# Patient Record
Sex: Female | Born: 1950
Health system: Southern US, Community
[De-identification: ages and names within clinical notes are randomized; demographics above are authoritative.]

## PROBLEM LIST (undated history)

## (undated) DIAGNOSIS — M069 Rheumatoid arthritis, unspecified: Secondary | ICD-10-CM

## (undated) DIAGNOSIS — D649 Anemia, unspecified: Secondary | ICD-10-CM

## (undated) DIAGNOSIS — M1990 Unspecified osteoarthritis, unspecified site: Secondary | ICD-10-CM

## (undated) DIAGNOSIS — G4730 Sleep apnea, unspecified: Secondary | ICD-10-CM

## (undated) DIAGNOSIS — I10 Essential (primary) hypertension: Secondary | ICD-10-CM

## (undated) DIAGNOSIS — M8580 Other specified disorders of bone density and structure, unspecified site: Secondary | ICD-10-CM

## (undated) DIAGNOSIS — R112 Nausea with vomiting, unspecified: Secondary | ICD-10-CM

## (undated) DIAGNOSIS — R42 Dizziness and giddiness: Secondary | ICD-10-CM

## (undated) DIAGNOSIS — I720 Aneurysm of carotid artery: Secondary | ICD-10-CM

## (undated) DIAGNOSIS — Z9289 Personal history of other medical treatment: Secondary | ICD-10-CM

## (undated) DIAGNOSIS — G43909 Migraine, unspecified, not intractable, without status migrainosus: Secondary | ICD-10-CM

## (undated) DIAGNOSIS — A599 Trichomoniasis, unspecified: Secondary | ICD-10-CM

## (undated) DIAGNOSIS — Z9889 Other specified postprocedural states: Secondary | ICD-10-CM

## (undated) DIAGNOSIS — R7303 Prediabetes: Secondary | ICD-10-CM

## (undated) HISTORY — DX: Rheumatoid arthritis, unspecified: M06.9

## (undated) HISTORY — DX: Dizziness and giddiness: R42

## (undated) HISTORY — DX: Anemia, unspecified: D64.9

## (undated) HISTORY — DX: Other specified disorders of bone density and structure, unspecified site: M85.80

## (undated) HISTORY — DX: Essential (primary) hypertension: I10

## (undated) HISTORY — DX: Trichomoniasis, unspecified: A59.9

## (undated) HISTORY — DX: Migraine, unspecified, not intractable, without status migrainosus: G43.909

## (undated) HISTORY — PX: KNEE SURGERY: SHX244

## (undated) HISTORY — DX: Aneurysm of carotid artery: I72.0

## (undated) HISTORY — PX: ABDOMINAL HYSTERECTOMY: SHX81

---

## 1994-11-19 HISTORY — PX: SHOULDER SURGERY: SHX246

## 1998-12-07 ENCOUNTER — Ambulatory Visit (HOSPITAL_COMMUNITY): Admission: RE | Admit: 1998-12-07 | Discharge: 1998-12-07 | Payer: Self-pay | Admitting: Internal Medicine

## 1998-12-07 ENCOUNTER — Encounter: Payer: Self-pay | Admitting: Internal Medicine

## 1998-12-09 ENCOUNTER — Ambulatory Visit (HOSPITAL_COMMUNITY): Admission: RE | Admit: 1998-12-09 | Discharge: 1998-12-09 | Payer: Self-pay | Admitting: Internal Medicine

## 1999-05-26 ENCOUNTER — Encounter: Admission: RE | Admit: 1999-05-26 | Discharge: 1999-06-15 | Payer: Self-pay | Admitting: Orthopedic Surgery

## 2000-11-06 ENCOUNTER — Encounter: Payer: Self-pay | Admitting: Obstetrics and Gynecology

## 2000-11-06 ENCOUNTER — Encounter: Admission: RE | Admit: 2000-11-06 | Discharge: 2000-11-06 | Payer: Self-pay | Admitting: Obstetrics and Gynecology

## 2001-05-01 ENCOUNTER — Encounter: Payer: Self-pay | Admitting: Cardiology

## 2001-05-01 ENCOUNTER — Ambulatory Visit (HOSPITAL_COMMUNITY): Admission: RE | Admit: 2001-05-01 | Discharge: 2001-05-01 | Payer: Self-pay | Admitting: Cardiology

## 2001-11-07 ENCOUNTER — Emergency Department (HOSPITAL_COMMUNITY): Admission: EM | Admit: 2001-11-07 | Discharge: 2001-11-07 | Payer: Self-pay | Admitting: Emergency Medicine

## 2001-11-07 ENCOUNTER — Encounter: Payer: Self-pay | Admitting: Emergency Medicine

## 2001-12-01 ENCOUNTER — Ambulatory Visit (HOSPITAL_COMMUNITY): Admission: RE | Admit: 2001-12-01 | Discharge: 2001-12-01 | Payer: Self-pay | Admitting: Cardiology

## 2001-12-01 ENCOUNTER — Encounter: Payer: Self-pay | Admitting: Cardiology

## 2002-07-28 ENCOUNTER — Ambulatory Visit (HOSPITAL_COMMUNITY): Admission: RE | Admit: 2002-07-28 | Discharge: 2002-07-28 | Payer: Self-pay

## 2003-05-01 ENCOUNTER — Emergency Department (HOSPITAL_COMMUNITY): Admission: EM | Admit: 2003-05-01 | Discharge: 2003-05-01 | Payer: Self-pay | Admitting: Emergency Medicine

## 2003-09-22 ENCOUNTER — Ambulatory Visit (HOSPITAL_COMMUNITY): Admission: RE | Admit: 2003-09-22 | Discharge: 2003-09-22 | Payer: Self-pay | Admitting: Interventional Radiology

## 2003-10-06 ENCOUNTER — Emergency Department (HOSPITAL_COMMUNITY): Admission: EM | Admit: 2003-10-06 | Discharge: 2003-10-07 | Payer: Self-pay | Admitting: Emergency Medicine

## 2003-10-26 ENCOUNTER — Ambulatory Visit (HOSPITAL_COMMUNITY): Admission: RE | Admit: 2003-10-26 | Discharge: 2003-10-26 | Payer: Self-pay | Admitting: Interventional Radiology

## 2003-11-09 ENCOUNTER — Ambulatory Visit (HOSPITAL_COMMUNITY): Admission: RE | Admit: 2003-11-09 | Discharge: 2003-11-09 | Payer: Self-pay | Admitting: Interventional Radiology

## 2003-11-20 DIAGNOSIS — I720 Aneurysm of carotid artery: Secondary | ICD-10-CM

## 2003-11-20 HISTORY — DX: Aneurysm of carotid artery: I72.0

## 2003-11-20 HISTORY — PX: OTHER SURGICAL HISTORY: SHX169

## 2003-12-21 ENCOUNTER — Inpatient Hospital Stay (HOSPITAL_COMMUNITY): Admission: RE | Admit: 2003-12-21 | Discharge: 2003-12-30 | Payer: Self-pay | Admitting: Critical Care Medicine

## 2003-12-30 ENCOUNTER — Inpatient Hospital Stay (HOSPITAL_COMMUNITY)
Admission: RE | Admit: 2003-12-30 | Discharge: 2004-01-05 | Payer: Self-pay | Admitting: Physical Medicine & Rehabilitation

## 2004-02-09 ENCOUNTER — Encounter
Admission: RE | Admit: 2004-02-09 | Discharge: 2004-05-09 | Payer: Self-pay | Admitting: Physical Medicine & Rehabilitation

## 2004-10-20 ENCOUNTER — Ambulatory Visit (HOSPITAL_COMMUNITY): Admission: RE | Admit: 2004-10-20 | Discharge: 2004-10-20 | Payer: Self-pay | Admitting: Interventional Radiology

## 2005-01-02 ENCOUNTER — Ambulatory Visit (HOSPITAL_COMMUNITY): Admission: RE | Admit: 2005-01-02 | Discharge: 2005-01-02 | Payer: Self-pay | Admitting: Cardiology

## 2005-01-05 ENCOUNTER — Ambulatory Visit (HOSPITAL_COMMUNITY): Admission: RE | Admit: 2005-01-05 | Discharge: 2005-01-05 | Payer: Self-pay | Admitting: Cardiology

## 2005-04-11 ENCOUNTER — Emergency Department (HOSPITAL_COMMUNITY): Admission: EM | Admit: 2005-04-11 | Discharge: 2005-04-11 | Payer: Self-pay | Admitting: Emergency Medicine

## 2005-10-12 ENCOUNTER — Ambulatory Visit (HOSPITAL_COMMUNITY): Admission: RE | Admit: 2005-10-12 | Discharge: 2005-10-12 | Payer: Self-pay | Admitting: Interventional Radiology

## 2006-07-30 ENCOUNTER — Encounter: Admission: RE | Admit: 2006-07-30 | Discharge: 2006-07-30 | Payer: Self-pay | Admitting: Orthopedic Surgery

## 2006-11-22 ENCOUNTER — Ambulatory Visit (HOSPITAL_COMMUNITY): Admission: RE | Admit: 2006-11-22 | Discharge: 2006-11-22 | Payer: Self-pay | Admitting: Interventional Radiology

## 2007-11-17 ENCOUNTER — Ambulatory Visit (HOSPITAL_COMMUNITY): Admission: RE | Admit: 2007-11-17 | Discharge: 2007-11-17 | Payer: Self-pay | Admitting: Interventional Radiology

## 2008-03-18 ENCOUNTER — Ambulatory Visit (HOSPITAL_COMMUNITY): Admission: RE | Admit: 2008-03-18 | Discharge: 2008-03-18 | Payer: Self-pay | Admitting: Cardiology

## 2008-05-28 ENCOUNTER — Emergency Department (HOSPITAL_COMMUNITY): Admission: EM | Admit: 2008-05-28 | Discharge: 2008-05-28 | Payer: Self-pay | Admitting: *Deleted

## 2009-06-07 ENCOUNTER — Encounter: Admission: RE | Admit: 2009-06-07 | Discharge: 2009-06-07 | Payer: Self-pay | Admitting: Cardiology

## 2010-04-26 ENCOUNTER — Ambulatory Visit (HOSPITAL_COMMUNITY): Admission: RE | Admit: 2010-04-26 | Discharge: 2010-04-26 | Payer: Self-pay | Admitting: Interventional Radiology

## 2010-12-10 ENCOUNTER — Encounter: Payer: Self-pay | Admitting: Interventional Radiology

## 2011-02-05 LAB — BUN: BUN: 15 mg/dL (ref 6–23)

## 2011-02-05 LAB — CREATININE, SERUM: GFR calc Af Amer: >60 mL/min (ref >60)

## 2011-04-06 NOTE — Discharge Summary (Signed)
NAME:  Joanna Lee, Joanna Lee                           ACCOUNT NO.:  [id number]   MEDICAL RECORD NO.:  [id number]                   PATIENT TYPE:  INP   LOCATION:  3002                                 FACILITY:  [facility]   PHYSICIAN:  Shan Levans, M.D. [facility]            DATE OF BIRTH:  1951-04-06   DATE OF ADMISSION:  12/21/2003  DATE OF DISCHARGE:                                 DISCHARGE SUMMARY   DISCHARGE DIAGNOSES:  1. Ventilator-dependent respiratory failure secondary to difficult     intubation for elective cerebral aneurysm coiling on December 21, 2003.  2. Cerebral aneurysm, status post coiling per interventional radiology.  3. Hypertension.  4. Failure-to-thrive and weakness; to be transferred to rehabilitation     center today.   HISTORY OF PRESENT ILLNESS:  Ms. Joanna Lee is a 60 year old African-  American female who has a family history of aneurysm.  She was evaluated for  aneurysm and found to have an aneurysm by angiography and proceeded for a  coiling of cerebral aneurysm.  Please see interventional radiology note for  details of this.  She required elective intubation.  It was a difficult  intubation with upper airway obstruction, performed by Dr. Jacklynn Bue, and in  his opinion she needed to remain intubated until her airway was completely  safe.  For that reason pulmonary critical care assumed her care.   LABORATORY DATA:  Urinalysis culture on February 7 shows Proteus mirabilis.  Arterial blood gases on assist control with a tidal volume of 600, with a  rate of 10, with 3 of PEEP show 7.33, PCO2 44, PO2 of 118.  WBC 7.8,  hemoglobin 11.6, hematocrit 34.5, platelets 252.  PTT of 71.  Sodium 138,  potassium 3.6, chloride 103, CO2 was 30, glucose 107, BUN was 8, creatinine  0.6, calcium 8.7.  Respiratory culture demonstrates normal oropharyngeal  flora.  Radiographic data:  Fluoroscopy for swallowing function study shows  the function was performed.  CT of the sinuses shows  negative except for  probable retention cyst in the right maxillary sinus.  CT of the abdomen and  pelvis showed no evidence of retroperitoneal/intraperitoneal hemorrhage,  small bilateral pleural effusion, mild bilateral lower lobe atelectasis.  CT  of the pelvis was unremarkable.  Chest x-ray shows bilateral airspace  disease and edema.  A 12-lead EKG shows normal sinus rhythm.   HOSPITAL COURSE BY DISCHARGE DIAGNOSIS:  #1 - VENTILATOR-DEPENDENT  RESPIRATORY FAILURE STATUS POST COILING OF AN ANEURYSM ON December 21, 2003.  She remained intubated from December 21, 2003 to December 24, 2003.  She was  extubated without problems at that time.  She remains extubated without  problems.  She also had a left subclavian central venous catheter placed on  February 3 and removed on February 6.  Her ventilator-dependent respiratory  failure quickly resolved with the removal of the endotracheal tube.   #2 - STATUS POST COILING OF  CEREBRAL ANEURYSM.  This has been ongoing  evaluated by Dr. Corliss Skains and will continue to be available for further  interventions.   #3 - HYPERTENSION.  She originally had been treated with hypertensives.   #4 - FAILURE-TO-THRIVE STATUS POST PROCEDURE.  She lives alone.  Therefore,  she has been evaluated for rehab center, to be transferred to rehab center  for further evaluation and treatment on December 30, 2003.   MEDICATIONS:  1. Albuterol nebulizer q.6h.  2. __________ two puffs t.i.d.  3. Avapro 300 mg daily.  4. Hydrochlorothiazide 12.5 mg daily to equal Diovan.  5. Singulair 10 mg q.h.s.  6. Aspirin 325 mg daily.  7. Plavix 75 mg daily.  8. Protonix 40 mg daily.  9. Avelox 400 mg daily until complete.  10.      Advair 150 one puff b.i.d.  11.      Tylenol p.r.n. 650 mg p.o. or per rectum.  12.      Darvocet-N 100 one tablet q.4h. p.r.n.   DIET:  As instructed per the rehab center.   DISPOSITION/CONDITION ON DISCHARGE:  Her ventilator-dependent  respiratory  failure has resolved.  Her hypertension is well controlled.  Her upper  airway problem is resolved.  She does have underlying asthma that is being  treated with Singulair.  Her main problem is debility secondary to coiling  of cerebral aneurysm.  She is being transferred to the rehab services today.      Brett Canales Minor, A.C.N.P. [facility]                 Shan Levans, M.D. [facility]    SM/MEDQ  D:  12/30/2003  T:  12/30/2003  Job:  [id number]   cc:   Sanjeev K. Corliss Skains, M.D.  [address] [place]  [place]  [place] [address]  Fax: [phone]

## 2011-04-06 NOTE — H&P (Signed)
NAME:  Joanna Lee, Joanna Lee                 ACCOUNT NO.:  [id number]   MEDICAL RECORD NO.:  [id number]          PATIENT TYPE:  AMB   LOCATION:  SDS                          FACILITY:  [facility]   PHYSICIAN:  Sanjeev K. Deveshwar, M.D.DATE OF BIRTH:  [birth year]   DATE OF ADMISSION:  10/12/2005  DATE OF DISCHARGE:                                HISTORY & PHYSICAL   CHIEF COMPLAINT:  The patient is here for cerebral angiogram today.   HISTORY OF PRESENT ILLNESS:  This is a 60 year old female who underwent  coiling as well as stenting of a left internal carotid artery aneurysm  performed December 21, 2003 by Dr. Corliss Skains.  The procedure went well;  however, following the procedure she had fairly complicated hospital course  and was eventually admitted to [facility] rehabilitation center.  She has recovered nicely and returns today for a weekly angiogram to further  evaluate her cerebrovascular disease.   PAST MEDICAL HISTORY:  Significant for:  1.  Hypertension.  2.  There was some question of asthma; however, the patient states that she      does not have asthma.   PAST SURGICAL HISTORY:  Significant for:  1.  Hysterectomy.  2.  Left knee arthroscopic surgery.   ALLERGIES:  No known drug allergies.   CURRENT MEDICATIONS:  1.  Aspirin daily.  2.  Potassium 20 mEq daily.  3.  Diovan/HCT daily.  4.  Atenolol 12.5 mg daily.   SOCIAL HISTORY:  The patient is divorced.  She had one child. She lives in  [place] alone.  She has never used tobacco.  She does not use alcohol.  She works at the [employer].   FAMILY HISTORY:  Her mother died in her 27's from an aneurysm.  Her father  died in his 7's from cancer.   REVIEW OF SYSTEMS:  Significant for chronic mild dyspnea and occasional  headaches, some mild nausea this morning, possibly due to anxiety, possibly  due to being NPO.  She also had a recent right ankle sprain.   PHYSICAL EXAMINATION:  GENERAL:  Pleasant,  60 year old African American  female in no acute distress.  VITAL SIGNS:  Blood pressure 110/59, pulse 72, respirations 16, temperature  99, oxygen saturation 95% on room air.  HEENT:  Unremarkable.  NECK:  No bruits, no jugular venous distension.  HEART:  Regular rate and rhythm without murmur.  LUNGS:  Clear.  ABDOMEN:  Obese, soft, nontender.  EXTREMITIES:  Pulses are weak to absent.  NEUROLOGIC:  Grossly intact with no significant deficits noted.  Her airway  is rated at a IV.  Her ASA scale is rated at a II.   IMPRESSION:  As noted, the patient is to undergo cerebral angiogram today to  further evaluate a previously coiled and stented left internal carotid  artery aneurysm from December 21, 2003.   LABORATORY DATA:  An INR 0.9, PTT 31, hemoglobin 11.8, hematocrit 35.5, WBC  6.7, platelets 308,000.  BUN 15, creatinine 0.8, potassium 3.5.      Delton See, P.A.    ______________________________  Sanjeev K. Corliss Skains, M.D.    DR/MEDQ  D:  10/12/2005  T:  10/12/2005  Job:  [id number]   cc:   Osvaldo Shipper. Spruill, M.D.  Fax: [phone]

## 2011-04-06 NOTE — Discharge Summary (Signed)
NAME:  Joanna Lee, Joanna Lee                           ACCOUNT NO.:  [id number]   MEDICAL RECORD NO.:  [id number]                   PATIENT TYPE:  IPS   LOCATION:  4007                                 FACILITY:  [facility]   PHYSICIAN:  [name], M.D.             DATE OF BIRTH:  Jun 25, 1951   DATE OF ADMISSION:  12/30/2003  DATE OF DISCHARGE:  01/05/2004                                 DISCHARGE SUMMARY   DISCHARGE DIAGNOSES:  1. Cerebral aneurysm, status post coiling and stenting.  2. Status post pneumonia.  3. Urinary tract infection.  4. History of hypertension.   HISTORY OF PRESENT ILLNESS:  The patient is a 60 year old female with past  medical history of headache x2 days. On December 21, 2003, the patient had a  cerebral angiogram and stenting and coiling of cerebral aneurysm by Sanjeev  K. Corliss Skains, M.D.  Postoperative complications include VDRL (but weaning  off), aspirate pneumonia, anemia status post transfusion, failure to thrive,  dysphagia, and UTI.  PT report at this time indicates the patient is  ambulating 80 feet with rolling walker min assist level, transfer min assist  level, bed mobility min assist level.   PAST MEDICAL HISTORY:  Significant for hypertension, questionable asthma.   PRIMARY CARE PHYSICIAN:  Osvaldo Shipper. Spruill, M.D.   PAST SURGICAL HISTORY:  Hysterectomy, left knee arthroscopic shoulder.   MEDICATIONS:  1. Diovan.  2. HCTZ.  3. Singulair.  4. Motrin as needed.   SOCIAL HISTORY:  The [place] in  one-level home, four to five steps to entry.  Independent prior to  admission.  She is employed by the [employer]. She is  divorced.  She has one adult child.  She has eight siblings and limited  family support.   FAMILY HISTORY:  Significant for aneurysm and CVA.   REVIEW OF SYSTEMS:  Significant for headache and COPD. Denies any chest  pain.   HOSPITAL COURSE:  The patient was admitted  to [facility] on  December 30, 2003, for comprehensive rehabilitation and received more than  three hours of therapy daily.  Overall, the patient progressed very well  during her six-day stay in rehab. She was discharged at a modified  independent level, ambulating greater than 150 feet with rolling walker.  The patient remained on aspirin and Plavix for CVA prophylaxis.  The patient  was placed on a course of Avelox for pneumonia.  The patient had no  significant cough or fever while in rehab.  Hospital course was significant  for mild hypokalemia as well as urinary tract infection and occasional  headache.  Blood pressure remained in good control on Avapro as well as  HCTZ.  The patient occasionally had headaches throughout rehab stay and she  received Ultram.  The headaches were relieved with Ultram. The patient also  had occasional dizziness and visual  deficits.  The patient had occupational  therapy and visual examination by occupational therapist.  Occupational  therapist recommends the patient wear glasses to help with photosensitivity  and recommends the patient to visit optometrist and actually she has been  working with neurological to further assess visual deficit in order for the  patient to eventually return to work.  Recommend the patient follow with  optometrist at the time of discharge.  The patient was followed throughout  by interventional radiology.  They recommend a repeat angiogram in three  months.  The patient was started on Macrodantin 50 mg q.i.d. for seven days,  January 02, 2004, for Staph aureus urinary tract infection.  There were no  other major issues that occurred while the patient was in rehab.  The  patient was discharged in an overall modified independent level.  Headaches  did improve with Ultram.   LABORATORY DATA:  Latest hemoglobin 12.6, hematocrit 37.2, white blood cell  count 9.3, platelet count 373.  Potassium 3.4, sodium 141,  chloride 105, CO2  29, glucose 108, BUN 14, creatinine 0.8, AST 23, ALT 39.   PT report indicates the patient is ambulating 150 feet with no assistive  device modified independently, transfer sit to stand modified independently,  ADL's on a modified independent level.  The patient still has some mild to  moderate memory deficits and visual deficits.  The patient is to follow up  with ophthalmologist and to follow-up with primary care Harden Bramer, Dr.  Shana Chute in six weeks and follow up with Sanjeev K. Deveshwar, M.D. in two  weeks.  The patient also is to follow up with [name], M.D. on  February 15, 2004, at 11:50.   The patient was discharged home with family and all vitals were stable.   DISCHARGE MEDICATIONS:  1. Plavix 75 mg daily.  2. Aspirin 325 mg daily.  3. Macrodantin 50 mg four times daily until January 09, 2004.  4. Resume Diovan HCT daily.  5. Ultram 50 mg every eight hours as needed for headache.   Pain management is Tylenol and Ultram.   ACTIVITY:  No driving, no smoking, no drinking alcohol.  No working.   FOLLOW UP:  Follow up with Dr. __________ ophthalmologist as well, Tyrone Apple. [name], M.D.  The patient will have home health therapy for physical  therapy and occupational therapy.      Drucilla Schmidt, P.A.                         [name], M.D.    LB/MEDQ  D:  01/05/2004  T:  01/05/2004  Job:  [id number]   cc:   Osvaldo Shipper. Spruill, M.D.  [address] [address]  [place]  [place] [address]  Fax: [phone]   Danice Goltz, M.D. [facility]   Simonne Maffucci K. Corliss Skains, M.D.  [address] [place]  [place]  [place] [address]  Fax: [phone]

## 2011-04-06 NOTE — Assessment & Plan Note (Signed)
Joanna Lee is here status post a cerebral aneurysm and subsequent coiling and  stent.  The patient presented to the rehabilitation unit with deconditioning  and balance difficulties, as well as some visual complaints.  She progressed  nicely to supervision level.  The family assisted her initially.  The  patient is now independent and having no problems.  She saw [name]  [name], M.D., several weeks back and he noted her to have some difficulties  with her visual acuity, but no focal field cuts.  He recommended a new eye  glass prescription.  The patient continues to have some problems with visual  acuity.  She denies any focal field cuts.  She has some hypersensitivity to  light.  She is sleeping well.  She has occasional headaches, but these seem  to be decreasing.  Her last one was about a week ago.  Her endurance is  still poor.  She tends to fall asleep easily.  She denies any problem with  mood or pain.   The patient is independent with all of ADLs and mobility currently.  She is  driving.  She would like to return to work as soon as possible.  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   REVIEW OF SYSTEMS:  The patient denies any chest pain, shortness of breath,  coughing, wheezing, or flu-like symptoms.  She does have some sore throat  and a coating on her tongue.  She denies any seizures, spasms, confusion, or  any mood problems.  She has had some reflux and heartburn.  He denies  nausea, vomiting, diarrhea, or constipation.   PHYSICAL EXAMINATION:  On physical examination today, the blood pressure is  134/69, pulse 52, and respiratory rate 16.  She is saturating 98% on room  air.  The patient moves all four extremities with 5/5 strength.  She had  excellent fine motor coordination and no signs of ataxia.  Sensory exam was  intact.  She had normal reflexes.  The patient was able to walk with high  heels today and move in a heel to toe straight line fashion  without loss of  balance.  Cognitively she was completely appropriate with attention, mood,  memory, and organization.  The cranial nerve exam was benign.  She had no  cyanosis, clubbing, or edema.  Pulses were 2+.  Ginette Pitman was noted on the  tongue and into the throat.   ASSESSMENT:  1. Status post cerebral aneurysm and coiling.  2. Oral candidiasis.   PLAN:  1. The patient has done extremely well to this point and I have no further     need to see her back.  She can have her medical issues followed by her     family physician.  2. I released her to work on a part-time basis for two weeks and then she     may increase her work time as tolerated.  She may need more than two     weeks to get back to full time at her job.  The patient seems to     understand this.  3. For the thrush, I have her Nystatin Swish and Swallow 5 mL q.i.d. for     seven days.      [name], M.D.   ZTS/MedQ  D:  02/15/2004 12:39:57  T:  02/15/2004 13:07:10  Job #:  [id number]   cc:   Osvaldo Shipper. Spruill, M.D.  [address]  [address]  [place]  [place] [address]  Fax: [phone]

## 2011-07-02 LAB — IFOBT (OCCULT BLOOD): IFOBT: NEGATIVE

## 2011-08-14 LAB — COMPREHENSIVE METABOLIC PANEL
AST: 17
Albumin: 3.5
Calcium: 9.8
Chloride: 105
Creatinine, Ser: 1.05
GFR calc Af Amer: >60

## 2011-08-14 LAB — CBC
MCV: 90.2
Platelets: 278
WBC: 7.8

## 2011-08-14 LAB — LIPID PANEL
HDL: 61
LDL Cholesterol: 128 — ABNORMAL HIGH
Total CHOL/HDL Ratio: 3.4
Triglycerides: 82
VLDL: 16

## 2011-08-16 LAB — DIFFERENTIAL
Basophils Absolute: 0
Lymphocytes Relative: 31
Neutro Abs: 5.6

## 2011-08-16 LAB — POCT CARDIAC MARKERS
CKMB, poc: 1.7
CKMB, poc: 2
Myoglobin, poc: 51

## 2011-08-16 LAB — URINALYSIS, ROUTINE W REFLEX MICROSCOPIC
Bilirubin Urine: NEGATIVE
Ketones, ur: NEGATIVE
Nitrite: NEGATIVE
Urobilinogen, UA: 1

## 2011-08-16 LAB — POCT I-STAT, CHEM 8
Glucose, Bld: 149 — ABNORMAL HIGH
HCT: 32 — ABNORMAL LOW
Hemoglobin: 10.9 — ABNORMAL LOW
Potassium: 2.9 — ABNORMAL LOW
Sodium: 141

## 2011-08-16 LAB — D-DIMER, QUANTITATIVE: D-Dimer, Quant: 0.3

## 2011-08-16 LAB — CBC
Platelets: 245
RDW: 13.3

## 2011-08-24 LAB — CREATININE, SERUM
Creatinine, Ser: 0.91
GFR calc Af Amer: >60

## 2011-08-24 LAB — BUN: BUN: 12

## 2011-11-29 ENCOUNTER — Ambulatory Visit (INDEPENDENT_AMBULATORY_CARE_PROVIDER_SITE_OTHER): Payer: Federal, State, Local not specified - PPO | Admitting: Cardiology

## 2011-11-29 ENCOUNTER — Encounter: Payer: Self-pay | Admitting: Cardiology

## 2011-11-29 DIAGNOSIS — R0602 Shortness of breath: Secondary | ICD-10-CM

## 2011-11-29 DIAGNOSIS — R42 Dizziness and giddiness: Secondary | ICD-10-CM

## 2011-11-29 DIAGNOSIS — R0600 Dyspnea, unspecified: Secondary | ICD-10-CM

## 2011-11-29 DIAGNOSIS — R0609 Other forms of dyspnea: Secondary | ICD-10-CM

## 2011-11-29 DIAGNOSIS — I10 Essential (primary) hypertension: Secondary | ICD-10-CM | POA: Insufficient documentation

## 2011-11-29 DIAGNOSIS — R002 Palpitations: Secondary | ICD-10-CM

## 2011-11-29 NOTE — Patient Instructions (Signed)
Please have blood work today (BNP)  The current medical regimen is effective;  continue present plan and medications.  Your physician has recommended that you wear an event monitor for 21 days. Event monitors are medical devices that record the heart's electrical activity. Doctors most often [place] these monitors to diagnose arrhythmias. Arrhythmias are problems with the speed or rhythm of the heartbeat. The monitor is a small, portable device. You can wear one while you do your normal daily activities. This is usually used to diagnose what is causing palpitations/syncope (passing out).  Follow up with Dr Antoine Poche after this testing.

## 2011-11-29 NOTE — Assessment & Plan Note (Signed)
I will start with a BNP level and have a low threshold for stress testing or echocardiography in the future.

## 2011-11-29 NOTE — Assessment & Plan Note (Signed)
Her blood pressure is mildly elevated. We discussed the blood pressure diary and therapeutic lifestyle changes. She will bring her diary  readings to upcoming visits.

## 2011-11-29 NOTE — Progress Notes (Signed)
HPI Patient presents as a new patient for evaluation of dizziness and shortness of breath. She says she had a history of pericarditis years ago. She says that over the past 2 years since moving to her current neighborhood she's had a cough. She says this is nonproductive. She's had no fevers or chills. She has been referred to a pulmonologist who she will see in a few days. She says she's had a negative sleep study. She doesn't describe any fevers or chills. She does get shortness of breath and she wonders if his environmental. It happens sporadically. She doesn't describe PND or orthopnea. She doesn't describe any palpitations. However, one of her significant complaints his dizziness. She's apparently had this for some time. She did have vertigo in the past but thinks this is not the same. She describes dizziness going from lying to standing position. She gets dizzy when she's been standing for a while. This may or may not be associated with shortness of breath. She hasn't had any frank syncope. She does have to sit down. She's not describing the room spinning or other focal neurologic findings. Of note she says that she did have one episode of nausea and vomiting recently when she had one of her dizzy episodes. She was referred here for further evaluation.  Allergies  Allergen Reactions  . Codeine     Current Outpatient Prescriptions  Medication Sig Dispense Refill  . aspirin 81 MG tablet Take 160 mg by mouth daily.      . ergocalciferol (VITAMIN D2) 50000 UNITS capsule Take 50,000 Units by mouth once a week.      . fish oil-omega-3 fatty acids 1000 MG capsule Take 2 g by mouth daily.      . folic acid (FOLVITE) 1 MG tablet Take 1 mg by mouth daily.      . methotrexate (RHEUMATREX) 2.5 MG tablet Take 7.5 mg by mouth once a week. Caution:Chemotherapy. Protect from light.      . Multiple Vitamins-Minerals (MULTIVITAMIN WITH MINERALS) tablet Take 1 tablet by mouth daily.      . valsartan (DIOVAN)  160 MG tablet Take 160 mg by mouth daily.        Past Medical History  Diagnosis Date  . HTN (hypertension)   . Vitamin D deficiency     Past Surgical History  Procedure Date  . Shoulder surgery 1996  . Coiling and stint 2005    CAROTID  . Knee surgery 2000    Family History  Problem Relation Age of Onset  . Cancer Father   . Cancer Brother   . Heart attack Brother   . Colon cancer Brother   . Colon cancer Sister   . Colon cancer Sister   . Hypertension Other   . Diabetes Other   . Gout Other     History   Social History  . Marital Status: Single    Spouse Name: N/A    Number of Children: N/A  . Years of Education: N/A   Occupational History  . retired    Social History Main Topics  . Smoking status: Never Smoker   . Smokeless tobacco: Not on file  . Alcohol Use: Not on file  . Drug Use: Not on file  . Sexually Active: Not on file   Other Topics Concern  . Not on file   Social History Narrative  . No narrative on file    ROS:   Positive for headaches, urinary frequency, urinary incontinence, joint  pains. Otherwise as stated in the HPI and negative for all other systems.   PHYSICAL EXAM BP 134/82  Pulse 88  Ht 5' 3.5" (1.613 m)  Wt 191 lb 1.9 oz (86.691 kg)  BMI 33.32 kg/m2 GENERAL:  Well appearing HEENT:  Pupils equal round and reactive, fundi not visualized, oral mucosa unremarkable NECK:  No jugular venous distention, waveform within normal limits, carotid upstroke brisk and symmetric, no bruits, no thyromegaly LYMPHATICS:  No cervical, inguinal adenopathy LUNGS:  Clear to auscultation bilaterally BACK:  No CVA tenderness CHEST:  Unremarkable HEART:  PMI not displaced or sustained,S1 and S2 within normal limits, no S3, no S4, no clicks, no rubs, no murmurs ABD:  Flat, positive bowel sounds normal in frequency in pitch, no bruits, no rebound, no guarding, no midline pulsatile mass, no hepatomegaly, no splenomegaly EXT:  2 plus pulses  throughout, no edema, no cyanosis no clubbing SKIN:  No rashes no nodules NEURO:  Cranial nerves II through XII grossly intact, motor grossly intact throughout PSYCH:  Cognitively intact, oriented to person place and time   EKG:  Sinus rhythm, rate 88, axis within normal limits, intervals within normal limits, no acute ST-T wave changes.   ASSESSMENT AND PLAN

## 2011-11-29 NOTE — Assessment & Plan Note (Signed)
She did not have an orthostatic blood pressure drop. I will start with a 21 day event monitor. I do note that electrolytes and thyroid were recently normal. I will have a low level for other testing such as tilt table testing.

## 2011-11-30 ENCOUNTER — Encounter (INDEPENDENT_AMBULATORY_CARE_PROVIDER_SITE_OTHER): Payer: Federal, State, Local not specified - PPO

## 2011-11-30 DIAGNOSIS — R002 Palpitations: Secondary | ICD-10-CM

## 2011-12-10 ENCOUNTER — Ambulatory Visit (INDEPENDENT_AMBULATORY_CARE_PROVIDER_SITE_OTHER)
Admission: RE | Admit: 2011-12-10 | Discharge: 2011-12-10 | Disposition: A | Discharge status: Home / Self Care | Payer: Federal, State, Local not specified - PPO | Source: Ambulatory Visit | Attending: Emergency Medicine | Admitting: Emergency Medicine

## 2011-12-10 ENCOUNTER — Ambulatory Visit (INDEPENDENT_AMBULATORY_CARE_PROVIDER_SITE_OTHER): Payer: Federal, State, Local not specified - PPO | Admitting: Emergency Medicine

## 2011-12-10 ENCOUNTER — Encounter: Payer: Self-pay | Admitting: Emergency Medicine

## 2011-12-10 VITALS — BP 138/80 | HR 84 | Temp 98.3°F | Ht 63.5 in | Wt 197.0 lb

## 2011-12-10 DIAGNOSIS — R0600 Dyspnea, unspecified: Secondary | ICD-10-CM

## 2011-12-10 DIAGNOSIS — M069 Rheumatoid arthritis, unspecified: Secondary | ICD-10-CM | POA: Insufficient documentation

## 2011-12-10 DIAGNOSIS — R0609 Other forms of dyspnea: Secondary | ICD-10-CM

## 2011-12-10 DIAGNOSIS — R0989 Other specified symptoms and signs involving the circulatory and respiratory systems: Secondary | ICD-10-CM

## 2011-12-10 NOTE — Progress Notes (Signed)
Subjective:    Patient ID: Joanna Lee, female    DOB: 1951-03-07, 61 y.o.   MRN: [id number]  HPI 61 yo woman, never smoker, hx RA on MTX x 1 year, HTN, chronic HA. She has been experiencing progressive exertional dyspnea for last 6 months, associated w dry cough. Has been worse over the last 2 weeks. Also with some episodic dizziness and nausea - has not had syncope but is being evaluated by Dr Antoine Poche for possible arrhythmia. The breathing difficulty and the dizziness do not always go together. In retrospect she has had some breathing difficulty in hot environment, warm air; has also had the dizziness before.    Review of Systems  Constitutional: Negative.  Negative for fever and unexpected weight change.  HENT: Negative for ear pain, nosebleeds, congestion, sore throat, rhinorrhea, sneezing, trouble swallowing, dental problem, postnasal drip and sinus pressure.   Eyes: Negative.  Negative for redness and itching.  Respiratory: Positive for cough and shortness of breath. Negative for chest tightness and wheezing.   Cardiovascular: Negative.  Negative for palpitations and leg swelling.  Gastrointestinal: Negative.  Negative for nausea and vomiting.  Genitourinary: Negative.  Negative for dysuria.  Musculoskeletal: Negative.  Negative for joint swelling.  Skin: Negative.  Negative for rash.  Neurological: Positive for headaches.  Hematological: Negative.  Does not bruise/bleed easily.  Psychiatric/Behavioral: Negative.  Negative for dysphoric mood. The patient is not nervous/anxious.    Past Medical History  Diagnosis Date  . HTN (hypertension)   . Vitamin D deficiency   . Rheumatoid arthritis   . Migraines   . Carotid artery aneurysm   . Vertigo   . Osteopenia      Family History  Problem Relation Age of Onset  . Lung cancer Father   . Lung cancer Brother   . Heart attack Brother 54  . Colon cancer Brother     x 2  . Colon cancer Sister   . Colon cancer Sister   .  Hypertension Other   . Diabetes Other   . Gout Other   . Multiple sclerosis Sister   . Ovarian cancer Sister   . Breast cancer Mother   . Asthma Brother      History   Social History  . Marital Status: Single    Spouse Name: N/A    Number of Children: 1  . Years of Education: N/A  She lives near landfills in [place]  Occupational History  . 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 RETIRED POSTAL WORKER [employer]   Social History Main Topics  . Smoking status: Never Smoker   . Smokeless tobacco: Not on file  . Alcohol Use: No  . Drug Use: No  . Sexually Active: Not on file   Other Topics Concern  . Not on file   Social History Narrative   Lives alone.     Allergies  Allergen Reactions  . Codeine      Outpatient Prescriptions Prior to Visit  Medication Sig Dispense Refill  . aspirin 81 MG tablet Take 160 mg by mouth daily.      . ergocalciferol (VITAMIN D2) 50000 UNITS capsule Take 50,000 Units by mouth once a week.      . fish oil-omega-3 fatty acids 1000 MG capsule Take 2 g by mouth daily.      . folic acid (FOLVITE) 1 MG tablet Take 1 mg by mouth daily.      . methotrexate (RHEUMATREX) 2.5 MG  tablet Take 7.5 mg by mouth once a week. Caution:Chemotherapy. Protect from light.      . Multiple Vitamins-Minerals (MULTIVITAMIN WITH MINERALS) tablet Take 1 tablet by mouth daily.      . valsartan (DIOVAN) 160 MG tablet Take 160 mg by mouth daily.             Objective:   Physical Exam  Gen: Pleasant, well-nourished, in no distress,  normal affect  ENT: No lesions,  mouth clear,  oropharynx clear, no postnasal drip  Neck: No JVD, no TMG, no carotid bruits  Lungs: No use of accessory muscles, no dullness to percussion, clear without rales or rhonchi  Cardiovascular: RRR, heart sounds normal, no murmur or gallops, no peripheral edema  Musculoskeletal: No deformities, no cyanosis or clubbing  Neuro: alert, non focal  Skin: Warm, no lesions or  rashes     Assessment & Plan:  Dyspnea ? Related to RA or to MTX. She is having formal cards eval now.  - CXR today - full PFT - rov next available

## 2011-12-10 NOTE — Assessment & Plan Note (Signed)
?   Related to RA or to MTX. She is having formal cards eval now.  - CXR today - full PFT - rov next available

## 2011-12-10 NOTE — Patient Instructions (Signed)
We will perform a CXR today We will perform full pulmonary function testing on the date of your next office visit Follow with Dr Delton Coombes next available appointment

## 2011-12-13 ENCOUNTER — Other Ambulatory Visit: Payer: Self-pay | Admitting: Orthopedic Surgery

## 2011-12-13 DIAGNOSIS — M542 Cervicalgia: Secondary | ICD-10-CM

## 2011-12-17 ENCOUNTER — Ambulatory Visit
Admission: RE | Admit: 2011-12-17 | Discharge: 2011-12-17 | Disposition: A | Discharge status: Home / Self Care | Payer: Federal, State, Local not specified - PPO | Source: Ambulatory Visit | Attending: Orthopedic Surgery | Admitting: Orthopedic Surgery

## 2011-12-17 DIAGNOSIS — M542 Cervicalgia: Secondary | ICD-10-CM

## 2011-12-18 ENCOUNTER — Other Ambulatory Visit (HOSPITAL_COMMUNITY): Payer: Self-pay | Admitting: Physician Assistant

## 2011-12-18 ENCOUNTER — Telehealth (HOSPITAL_COMMUNITY): Payer: Self-pay

## 2011-12-18 DIAGNOSIS — I671 Cerebral aneurysm, nonruptured: Secondary | ICD-10-CM | POA: Insufficient documentation

## 2011-12-18 NOTE — Progress Notes (Signed)
Addended by: Michael Litter D on: 12/18/2011 11:19 AM   Modules accepted: Orders

## 2011-12-20 ENCOUNTER — Ambulatory Visit
Admission: RE | Admit: 2011-12-20 | Discharge: 2011-12-20 | Disposition: A | Discharge status: Home / Self Care | Payer: Federal, State, Local not specified - PPO | Source: Ambulatory Visit | Attending: Orthopedic Surgery | Admitting: Orthopedic Surgery

## 2011-12-26 ENCOUNTER — Ambulatory Visit (HOSPITAL_COMMUNITY)
Admission: RE | Admit: 2011-12-26 | Discharge: 2011-12-26 | Disposition: A | Discharge status: Home / Self Care | Payer: Federal, State, Local not specified - PPO | Source: Ambulatory Visit | Attending: Interventional Radiology | Admitting: Interventional Radiology

## 2011-12-26 DIAGNOSIS — I671 Cerebral aneurysm, nonruptured: Secondary | ICD-10-CM | POA: Insufficient documentation

## 2011-12-26 DIAGNOSIS — R0989 Other specified symptoms and signs involving the circulatory and respiratory systems: Secondary | ICD-10-CM

## 2011-12-26 NOTE — Progress Notes (Signed)
Bilateral carotid artery duplex completed at 09:53.  Preliminary report is no evidence of significant ICA stenosis.

## 2012-01-08 ENCOUNTER — Ambulatory Visit: Payer: Federal, State, Local not specified - PPO | Admitting: Emergency Medicine

## 2012-02-05 ENCOUNTER — Ambulatory Visit (INDEPENDENT_AMBULATORY_CARE_PROVIDER_SITE_OTHER): Payer: Federal, State, Local not specified - PPO | Admitting: Emergency Medicine

## 2012-02-05 ENCOUNTER — Encounter: Payer: Self-pay | Admitting: Emergency Medicine

## 2012-02-05 VITALS — BP 120/78 | HR 87 | Temp 98.0°F | Ht 64.0 in | Wt 190.0 lb

## 2012-02-05 DIAGNOSIS — R0609 Other forms of dyspnea: Secondary | ICD-10-CM

## 2012-02-05 DIAGNOSIS — R053 Chronic cough: Secondary | ICD-10-CM | POA: Insufficient documentation

## 2012-02-05 DIAGNOSIS — R0600 Dyspnea, unspecified: Secondary | ICD-10-CM

## 2012-02-05 DIAGNOSIS — R05 Cough: Secondary | ICD-10-CM | POA: Insufficient documentation

## 2012-02-05 LAB — PULMONARY FUNCTION TEST

## 2012-02-05 NOTE — Assessment & Plan Note (Signed)
Suspect a GERD contribution, but she dosn;t want to start a PPI - will try tussionex + cyclical cough sheet - if stil coughiung then revisit PPI vs perform FOB to insure no anatomical issues.

## 2012-02-05 NOTE — Patient Instructions (Signed)
Please try the relaxation techniques as outlined on the Cyclical Cough Sheet. Follow with Dr Delton Coombes in 1 month to review your status.

## 2012-02-05 NOTE — Telephone Encounter (Signed)
Contacts         Type  Contact  Phone    12/18/2011 11:26 AM  Phone (Outgoing)  Lee, Joanna Shugart (Self)  [phone] (M)    Completed- called pt to remind her of her f/u doppler study on 12-26-11 @ 10am/930admitting arrival

## 2012-02-05 NOTE — Progress Notes (Signed)
  Subjective:    Patient ID: Joanna Lee, female    DOB: Oct 31, 1951, 61 y.o.   MRN: [id number] HPI 61 yo woman, never smoker, hx RA on MTX x 1 year, HTN, chronic HA. She has been experiencing progressive exertional dyspnea for last 6 months, associated w dry cough. Has been worse over the last 2 weeks. Also with some episodic dizziness and nausea - has not had syncope but is being evaluated by Dr Antoine Poche for possible arrhythmia. The breathing difficulty and the dizziness do not always go together. In retrospect she has had some breathing difficulty in hot environment, warm air; has also had the dizziness before.   ROV 02/05/12 -- 61 yo with RA on MTX. Progressive DOE. Returns after PFT's = restriction vs possible obstruction (doubt), volumes restricted, DLCO decreased and corrects for Va. CXR 1/13 didn't show any evidence ILD.  Cough is her biggest complaint. No nasal congestion or GERD symptoms. Some sore throat at night, wakes her up.      Objective:   Physical Exam  Gen: Pleasant, well-nourished, in no distress,  normal affect  ENT: No lesions,  mouth clear,  oropharynx clear, no postnasal drip  Neck: No JVD, no TMG, no carotid bruits  Lungs: No use of accessory muscles, no dullness to percussion, clear without rales or rhonchi  Cardiovascular: RRR, heart sounds normal, no murmur or gallops, no peripheral edema  Musculoskeletal: No deformities, no cyanosis or clubbing  Neuro: alert, non focal  Skin: Warm, no lesions or rashes     Assessment & Plan:  Chronic cough Suspect a GERD contribution, but she dosn;t want to start a PPI - will try tussionex + cyclical cough sheet - if stil coughiung then revisit PPI vs perform FOB to insure no anatomical issues.

## 2012-02-05 NOTE — Progress Notes (Signed)
PFT done today. 

## 2012-02-14 ENCOUNTER — Encounter: Payer: Self-pay | Admitting: Emergency Medicine

## 2012-02-18 ENCOUNTER — Other Ambulatory Visit: Payer: Self-pay | Admitting: Orthopedic Surgery

## 2012-02-18 DIAGNOSIS — M19019 Primary osteoarthritis, unspecified shoulder: Secondary | ICD-10-CM

## 2012-02-18 DIAGNOSIS — T148XXA Other injury of unspecified body region, initial encounter: Secondary | ICD-10-CM

## 2012-02-21 ENCOUNTER — Ambulatory Visit
Admission: RE | Admit: 2012-02-21 | Discharge: 2012-02-21 | Disposition: A | Discharge status: Home / Self Care | Payer: Federal, State, Local not specified - PPO | Source: Ambulatory Visit | Attending: Orthopedic Surgery | Admitting: Orthopedic Surgery

## 2012-02-21 DIAGNOSIS — M19019 Primary osteoarthritis, unspecified shoulder: Secondary | ICD-10-CM

## 2012-02-21 DIAGNOSIS — T148XXA Other injury of unspecified body region, initial encounter: Secondary | ICD-10-CM

## 2012-03-12 ENCOUNTER — Ambulatory Visit: Payer: Federal, State, Local not specified - PPO | Admitting: Emergency Medicine

## 2012-04-03 ENCOUNTER — Ambulatory Visit (INDEPENDENT_AMBULATORY_CARE_PROVIDER_SITE_OTHER): Payer: Federal, State, Local not specified - PPO | Admitting: Emergency Medicine

## 2012-04-03 ENCOUNTER — Encounter: Payer: Self-pay | Admitting: Emergency Medicine

## 2012-04-03 VITALS — BP 110/68 | HR 68 | Temp 98.6°F | Ht 64.0 in | Wt 187.6 lb

## 2012-04-03 DIAGNOSIS — R0600 Dyspnea, unspecified: Secondary | ICD-10-CM

## 2012-04-03 DIAGNOSIS — R05 Cough: Secondary | ICD-10-CM

## 2012-04-03 DIAGNOSIS — R0609 Other forms of dyspnea: Secondary | ICD-10-CM

## 2012-04-03 MED ORDER — VALSARTAN-HYDROCHLOROTHIAZIDE 160-12.5 MG PO TABS: 1.0000 | ORAL_TABLET | Freq: Every day | ORAL | Status: DC

## 2012-04-03 NOTE — Progress Notes (Signed)
  Subjective:    Patient ID: Joanna Lee, female    DOB: Jul 15, 1951, 61 y.o.   MRN: [id number] HPI 61 yo woman, never smoker, hx RA on MTX x 1 year, HTN, chronic HA. She has been experiencing progressive exertional dyspnea for last 6 months, associated w dry cough. Has been worse over the last 2 weeks. Also with some episodic dizziness and nausea - has not had syncope but is being evaluated by Dr Antoine Poche for possible arrhythmia. The breathing difficulty and the dizziness do not always go together. In retrospect she has had some breathing difficulty in hot environment, warm air; has also had the dizziness before.   ROV 02/05/12 -- 61 yo with RA on MTX. Progressive DOE. Returns after PFT's = restriction vs possible obstruction (doubt), volumes restricted, DLCO decreased and corrects for Va. CXR 1/13 didn't show any evidence ILD.  Cough is her biggest complaint. No nasal congestion or GERD symptoms. Some sore throat at night, wakes her up.   ROV 04/03/12 -- 61 yo with RA on MTX. Progressive DOE with restriction, no ILD. Biggest current issue is cough. Last time we started tussionex. I suspected GERD contribution but she didn't want to start PPI. Recently unfortunately changed from diovan to lisinopril/HCTZ!! (Used to be on diovan/HCTZ 160/12.5mg  [facility]).  Cough is worst when she does a lot of talking.      Objective:   Physical Exam  Gen: Pleasant, well-nourished, in no distress,  normal affect  ENT: No lesions,  mouth clear,  oropharynx clear, no postnasal drip  Neck: No JVD, no TMG, no carotid bruits  Lungs: No use of accessory muscles, no dullness to percussion, clear without rales or rhonchi  Cardiovascular: RRR, heart sounds normal, no murmur or gallops, no peripheral edema  Musculoskeletal: No deformities, no cyanosis or clubbing  Neuro: alert, non focal  Skin: Warm, no lesions or rashes     Assessment & Plan:  Chronic cough - need to get her off lisinopril - she  isn't bothered enough to want to start allergy regimen or PPI - advised her to avoid perfumes, fumes.   Dyspnea Suspect restriction and deconditioning.

## 2012-04-03 NOTE — Assessment & Plan Note (Signed)
Suspect restriction and deconditioning.

## 2012-04-03 NOTE — Assessment & Plan Note (Signed)
-   need to get her off lisinopril - she isn't bothered enough to want to start allergy regimen or PPI - advised her to avoid perfumes, fumes.

## 2012-04-03 NOTE — Patient Instructions (Signed)
We will order your diovan/HCTZ through [facility] Stop lisinopril/HCTZ If your cough is worsening and you want to try medication for allergies or GERD then please call our office Follow with Dr Delton Coombes in 1 year

## 2012-07-14 ENCOUNTER — Other Ambulatory Visit (HOSPITAL_COMMUNITY): Payer: Self-pay | Admitting: Interventional Radiology

## 2012-07-14 DIAGNOSIS — I729 Aneurysm of unspecified site: Secondary | ICD-10-CM

## 2012-07-16 ENCOUNTER — Telehealth (HOSPITAL_COMMUNITY): Payer: Self-pay

## 2012-07-16 ENCOUNTER — Ambulatory Visit (HOSPITAL_COMMUNITY)
Admission: RE | Admit: 2012-07-16 | Discharge: 2012-07-16 | Disposition: A | Discharge status: Home / Self Care | Payer: Federal, State, Local not specified - PPO | Source: Ambulatory Visit | Attending: Interventional Radiology | Admitting: Interventional Radiology

## 2012-07-16 ENCOUNTER — Other Ambulatory Visit (HOSPITAL_COMMUNITY): Payer: Self-pay | Admitting: Interventional Radiology

## 2012-07-16 DIAGNOSIS — R52 Pain, unspecified: Secondary | ICD-10-CM

## 2012-07-16 DIAGNOSIS — I729 Aneurysm of unspecified site: Secondary | ICD-10-CM

## 2012-07-16 DIAGNOSIS — R42 Dizziness and giddiness: Secondary | ICD-10-CM

## 2012-07-16 DIAGNOSIS — G479 Sleep disorder, unspecified: Secondary | ICD-10-CM

## 2012-07-16 DIAGNOSIS — H538 Other visual disturbances: Secondary | ICD-10-CM

## 2012-07-16 NOTE — Telephone Encounter (Signed)
I spoke to Mrs. Korenek in regards to her MRI/ MRA apt.  She had a good understanding of the date and time.

## 2012-07-25 ENCOUNTER — Ambulatory Visit (HOSPITAL_COMMUNITY)
Admission: RE | Admit: 2012-07-25 | Discharge: 2012-07-25 | Disposition: A | Discharge status: Home / Self Care | Payer: Federal, State, Local not specified - PPO | Source: Ambulatory Visit | Attending: Interventional Radiology | Admitting: Interventional Radiology

## 2012-07-25 ENCOUNTER — Ambulatory Visit (HOSPITAL_COMMUNITY): Payer: Federal, State, Local not specified - PPO

## 2012-07-25 DIAGNOSIS — R42 Dizziness and giddiness: Secondary | ICD-10-CM

## 2012-07-25 DIAGNOSIS — H538 Other visual disturbances: Secondary | ICD-10-CM

## 2012-07-25 DIAGNOSIS — G479 Sleep disorder, unspecified: Secondary | ICD-10-CM

## 2012-07-25 DIAGNOSIS — H539 Unspecified visual disturbance: Secondary | ICD-10-CM | POA: Insufficient documentation

## 2012-07-25 DIAGNOSIS — I720 Aneurysm of carotid artery: Secondary | ICD-10-CM | POA: Insufficient documentation

## 2012-07-25 DIAGNOSIS — R52 Pain, unspecified: Secondary | ICD-10-CM

## 2012-07-25 LAB — BUN: BUN: 12 mg/dL (ref 6–23)

## 2012-07-25 LAB — CREATININE, SERUM
Creatinine, Ser: 0.69 mg/dL (ref 0.50–1.10)
GFR calc Af Amer: >90 mL/min (ref >90)

## 2012-07-25 MED ORDER — GADOBENATE DIMEGLUMINE 529 MG/ML IV SOLN
15.0000 mL | Freq: Once | INTRAVENOUS | Status: AC
  Administered 2012-07-25: 15 mL via INTRAVENOUS

## 2012-07-28 ENCOUNTER — Telehealth (HOSPITAL_COMMUNITY): Payer: Self-pay

## 2012-07-28 NOTE — Telephone Encounter (Signed)
I called Mrs. Crites cell and its not taking messages.  I will try her back.  Dr. Corliss Skains stated that everything looked ok.  She needs to keep taking her anti-plat. Medication and he will see her in a year.

## 2012-12-09 ENCOUNTER — Telehealth (HOSPITAL_COMMUNITY): Payer: Self-pay | Admitting: Radiology

## 2012-12-09 NOTE — Progress Notes (Signed)
Pt called with c/o sudden onset of headache. Whole head ache, not specific to one side. Lasted about and has now subsided. She denies any associated sxs such as loss of visiosn, slurred speech, or extremity weakness.  She is taking her ASA daily as directed.  Pt known to [place] for prior NIR treatment of coiled (L)cervical ICA aneurysm.  She will monitor for sxs recurrence or if headache becomes associated with other TIA/CVA type sxs.  [place], [facility]

## 2012-12-12 ENCOUNTER — Other Ambulatory Visit (HOSPITAL_COMMUNITY): Payer: Self-pay | Admitting: Internal Medicine

## 2012-12-12 DIAGNOSIS — R51 Headache: Secondary | ICD-10-CM

## 2012-12-12 DIAGNOSIS — I729 Aneurysm of unspecified site: Secondary | ICD-10-CM

## 2012-12-13 ENCOUNTER — Ambulatory Visit (HOSPITAL_COMMUNITY)
Admission: RE | Admit: 2012-12-13 | Discharge: 2012-12-13 | Disposition: A | Discharge status: Home / Self Care | Payer: Federal, State, Local not specified - PPO | Source: Ambulatory Visit | Attending: Internal Medicine | Admitting: Internal Medicine

## 2012-12-13 ENCOUNTER — Other Ambulatory Visit (HOSPITAL_COMMUNITY): Payer: Self-pay | Admitting: Internal Medicine

## 2012-12-13 DIAGNOSIS — I729 Aneurysm of unspecified site: Secondary | ICD-10-CM

## 2012-12-13 DIAGNOSIS — R51 Headache: Secondary | ICD-10-CM

## 2012-12-13 LAB — CREATININE, SERUM: Creatinine, Ser: 0.75 mg/dL (ref 0.50–1.10)

## 2012-12-13 MED ORDER — GADOBENATE DIMEGLUMINE 529 MG/ML IV SOLN
18.0000 mL | Freq: Once | INTRAVENOUS | Status: AC | PRN
  Administered 2012-12-13: 18 mL via INTRAVENOUS

## 2013-01-09 ENCOUNTER — Other Ambulatory Visit (HOSPITAL_COMMUNITY): Payer: Self-pay | Admitting: Interventional Radiology

## 2013-01-09 ENCOUNTER — Other Ambulatory Visit: Payer: Self-pay | Admitting: Radiology

## 2013-01-20 ENCOUNTER — Ambulatory Visit (HOSPITAL_COMMUNITY)
Admission: RE | Admit: 2013-01-20 | Discharge: 2013-01-20 | Disposition: A | Discharge status: Home / Self Care | Payer: Federal, State, Local not specified - PPO | Source: Ambulatory Visit | Attending: Interventional Radiology | Admitting: Interventional Radiology

## 2013-01-20 DIAGNOSIS — I6529 Occlusion and stenosis of unspecified carotid artery: Secondary | ICD-10-CM | POA: Insufficient documentation

## 2013-01-20 DIAGNOSIS — R42 Dizziness and giddiness: Secondary | ICD-10-CM

## 2013-01-20 NOTE — Progress Notes (Signed)
Bilateral:  No evidence of hemodynamically significant internal carotid artery stenosis.   Vertebral artery flow is antegrade.     

## 2013-04-03 ENCOUNTER — Ambulatory Visit (INDEPENDENT_AMBULATORY_CARE_PROVIDER_SITE_OTHER): Payer: Federal, State, Local not specified - PPO | Admitting: Emergency Medicine

## 2013-04-03 ENCOUNTER — Ambulatory Visit (INDEPENDENT_AMBULATORY_CARE_PROVIDER_SITE_OTHER)
Admission: RE | Admit: 2013-04-03 | Discharge: 2013-04-03 | Disposition: A | Discharge status: Home / Self Care | Payer: Federal, State, Local not specified - PPO | Source: Ambulatory Visit | Attending: Emergency Medicine | Admitting: Emergency Medicine

## 2013-04-03 ENCOUNTER — Encounter: Payer: Self-pay | Admitting: Emergency Medicine

## 2013-04-03 VITALS — BP 140/82 | HR 63 | Temp 98.4°F | Ht 62.0 in | Wt 200.2 lb

## 2013-04-03 DIAGNOSIS — R0600 Dyspnea, unspecified: Secondary | ICD-10-CM

## 2013-04-03 DIAGNOSIS — M069 Rheumatoid arthritis, unspecified: Secondary | ICD-10-CM

## 2013-04-03 DIAGNOSIS — R0989 Other specified symptoms and signs involving the circulatory and respiratory systems: Secondary | ICD-10-CM

## 2013-04-03 DIAGNOSIS — R0609 Other forms of dyspnea: Secondary | ICD-10-CM

## 2013-04-03 NOTE — Patient Instructions (Addendum)
Please have a CXR today. We will call you with the results Follow with Dr Delton Coombes in 12 months or sooner if you have any problems

## 2013-04-03 NOTE — Progress Notes (Signed)
  Subjective:    Patient ID: Joanna Lee, female    DOB: [birth year], 63 y.o.   MRN: [id number] HPI 62 yo woman, never smoker, hx RA on MTX x 1 year, HTN, chronic HA. She has been experiencing progressive exertional dyspnea for last 6 months, associated w dry cough. Has been worse over the last 2 weeks. Also with some episodic dizziness and nausea - has not had syncope but is being evaluated by Dr Antoine Poche for possible arrhythmia. The breathing difficulty and the dizziness do not always go together. In retrospect she has had some breathing difficulty in hot environment, warm air; has also had the dizziness before.   ROV 02/05/12 -- 62 yo with RA on MTX. Progressive DOE. Returns after PFT's = restriction vs possible obstruction (doubt), volumes restricted, DLCO decreased and corrects for Va. CXR 1/13 didn't show any evidence ILD.  Cough is her biggest complaint. No nasal congestion or GERD symptoms. Some sore throat at night, wakes her up.   ROV 04/03/12 -- 62 yo with RA on MTX. Progressive DOE with restriction, no ILD. Biggest current issue is cough. Last time we started tussionex. I suspected GERD contribution but she didn't want to start PPI. Recently unfortunately changed from diovan to lisinopril/HCTZ!! (Used to be on diovan/HCTZ 160/12.5mg  [facility]).  Cough is worst when she does a lot of talking.   ROV 04/03/13 -- hx RA on MTX, dyspnea and cough. No ILD on imaging to date. Has not had TTE. Her ACE-I was changed back to diovan-HCTZ. Her cough lingers but is better. She is sleepy, had PSG at the [place] 2 yrs ago > no OSA. No real change. She is doing water aerobics.      Objective:   Physical Exam Filed Vitals:   04/03/13 1451  BP: 140/82  Pulse: 63  Temp: 98.4 F (36.9 C)  TempSrc: Oral  Height: 5\' 2"  (1.575 m)  Weight: 200 lb 3.2 oz (90.81 kg)  SpO2: 97%   Gen: Pleasant, well-nourished, in no distress,  normal affect  ENT: No lesions,  mouth clear,  oropharynx clear, no postnasal  drip  Neck: No JVD, no TMG, no carotid bruits  Lungs: No use of accessory muscles, no dullness to percussion, clear without rales or rhonchi  Cardiovascular: RRR, heart sounds normal, no murmur or gallops, no peripheral edema  Musculoskeletal: No deformities, no cyanosis or clubbing  Neuro: alert, non focal  Skin: Warm, no lesions or rashes     Assessment & Plan:  Rheumatoid arthritis - CXR today  Dyspnea Suspect this was largely deconditioning. She has improved over the last yr. She is at risk for ILD or for [facility]. Given her stability will defer TTE for now, but would perform in the future if she changes.  - CXR today - ov 12 mon

## 2013-04-03 NOTE — Addendum Note (Signed)
Addended by: Caryl Ada on: 04/03/2013 03:12 PM   Modules accepted: Orders

## 2013-04-03 NOTE — Assessment & Plan Note (Signed)
Suspect this was largely deconditioning. She has improved over the last yr. She is at risk for ILD or for [facility]. Given her stability will defer TTE for now, but would perform in the future if she changes.  - CXR today - ov 12 mon

## 2013-04-03 NOTE — Assessment & Plan Note (Signed)
CXR today.  

## 2013-04-14 ENCOUNTER — Telehealth: Payer: Self-pay | Admitting: Emergency Medicine

## 2013-04-14 NOTE — Progress Notes (Signed)
Quick Note:  Error  ______

## 2013-04-14 NOTE — Telephone Encounter (Signed)
Spoke with patient, made her aware that she was called today by mistake Pt verbalized understanding and nothing further needed at this time.

## 2013-04-14 NOTE — Progress Notes (Signed)
Quick Note:  ATC patient, no answer Phone rand several times on home # with no option to leave message. WCB ______

## 2014-01-01 ENCOUNTER — Telehealth (HOSPITAL_COMMUNITY): Payer: Self-pay | Admitting: Interventional Radiology

## 2014-01-01 NOTE — Telephone Encounter (Signed)
Called pt to let her know that Dr. Arlean Hopping wife is in fact taking new patients. She called and asked about seeing his wife. JM

## 2014-01-26 NOTE — Progress Notes (Signed)
Surgery on 02/08/14.  Preop on 02/02/14 at 1130am.  Need orders in EPIC.  Thank You.

## 2014-01-28 ENCOUNTER — Other Ambulatory Visit: Payer: Self-pay | Admitting: Orthopedic Surgery

## 2014-01-28 ENCOUNTER — Encounter (HOSPITAL_COMMUNITY): Payer: Self-pay | Admitting: Pharmacy Technician

## 2014-01-28 NOTE — Progress Notes (Signed)
Surgery on 02/08/13.  Preop on 02/02/14 at 1130am.  Need orders in EPIC.  Thank You.

## 2014-01-29 ENCOUNTER — Other Ambulatory Visit (HOSPITAL_COMMUNITY): Payer: Self-pay | Admitting: Interventional Radiology

## 2014-01-29 ENCOUNTER — Other Ambulatory Visit: Payer: Self-pay | Admitting: Orthopedic Surgery

## 2014-01-29 DIAGNOSIS — I729 Aneurysm of unspecified site: Secondary | ICD-10-CM

## 2014-01-29 NOTE — H&P (Signed)
Joanna Lee DOB: 06-26-1951 Single / Language: [place] / Race: Black or African American Female  Date of Admission:  02-08-2014  Chief Complaint:  Left Knee Pain  History of Present Illness The patient is a 63 year old female who comes in today for a preoperative History and Physical. The patient is scheduled for a left total knee arthroplasty to be performed by Dr. Dione Plover. Aluisio, MD at [facility] on 02-08-2014. The patient is a 63 year old female who presents for follow up of their knee. The patient is being followed for their left knee pain and osteoarthritis. They are out from injury (while in [place]). Symptoms reported today include: pain, swelling (but no more than usual), aching, throbbing, difficulty ambulating and difficulty arising from chair. The patient feels that they are doing poorly and report their pain level to be moderate to severe. Current treatment includes: NSAIDs (Ibuprofen) and pain medications. The following medication has been used for pain control: Percocet. The patient presents today following a fall on 11/03/13. The patient has reported improvement of their symptoms with: activity modification. The patient indicates that they are ready to have surgery after this recent injury. She has had a lot of pain in that left knee since this episode. She said she misstepped and hyperflexed the knees. She had intense pain initially on the left at rest and with activity. Now it is mostly activity related. She has not been able to get back towards any regular activities yet because of the pain. It did swell on her initially but she said the swelling has gotten somewhat better now. She is now ready to proceed with knee replacement surgery. They have been treated conservatively in the past for the above stated problem and despite conservative measures, they continue to have progressive pain and severe functional limitations and dysfunction. They have failed  non-operative management including home exercise, medications. It is felt that they would benefit from undergoing total joint replacement. Risks and benefits of the procedure have been discussed with the patient and they elect to proceed with surgery. There are no active contraindications to surgery such as ongoing infection or rapidly progressive neurological disease.   Allergies No Known Drug Allergies - Patient states that she is sensitive to medications.   Problem List/Past Medical Primary osteoarthritis of one knee (715.16) Rheumatoid Arthritis Migraines Anemia Hypertension Hypercholesterolemia Left Carotid Aneurysm History of Bronchitis Vertigo Rheumatic Fever - Childhood    Family History Diabetes Mellitus. mother Hypertension. mother Cancer. First Degree Relatives. father, sister and brother   Social History Exercise. Exercises daily; does other Pain Contract. no Tobacco use. Never smoker. never smoker Tobacco / smoke exposure. no Number of flights of stairs before winded. less than 1 Most recent primary occupation. [employer] Marital status. single Previously in rehab. no Drug/Alcohol Rehab (Currently). no Living situation. live alone Illicit drug use. no Current work status. working part time Children. 1 Alcohol use. current drinker; drinks wine; only occasionally per week Post-Surgical Plans. Inpatient Rehab   Medication History Potassium Chloride (20MEQ Tablet ER, Oral) Active. Aspirin (81MG  Tablet, Oral) Active. Diovan ( Oral) Specific dose unknown - Active. Multiple Vitamin ( Oral) Active.   Past Surgical History Rotator Cuff Repair. left Hysterectomy. complete (non-cancerous) Arthroscopy of Knee. bilateral Carotid Artery Surgery. left - Coil Procedure/Stent   Review of Systems General:Not Present- Chills, Fever, Night Sweats, Fatigue, Weight Gain, Weight Loss and Memory Loss. Skin:Not Present-  Hives, Itching, Rash, Eczema and Lesions. HEENT:Not Present- Tinnitus, Headache,  Double Vision, Visual Loss, Hearing Loss and Dentures. Respiratory:Not Present- Shortness of breath with exertion, Shortness of breath at rest, Allergies, Coughing up blood and Chronic Cough. Cardiovascular:Not Present- Chest Pain, Racing/skipping heartbeats, Difficulty Breathing Lying Down, Murmur, Swelling and Palpitations. Gastrointestinal:Not Present- Bloody Stool, Heartburn, Abdominal Pain, Vomiting, Nausea, Constipation, Diarrhea, Difficulty Swallowing, Jaundice and Loss of appetitie. Female Genitourinary:Not Present- Blood in Urine, Urinary frequency, Weak urinary stream, Discharge, Flank Pain, Incontinence, Painful Urination, Urgency, Urinary Retention and Urinating at Night. Musculoskeletal:Present- Joint Pain. Not Present- Muscle Weakness, Muscle Pain, Joint Swelling, Back Pain, Morning Stiffness and Spasms. Neurological:Not Present- Tremor, Dizziness, Blackout spells, Paralysis, Difficulty with balance and Weakness. Psychiatric:Not Present- Insomnia.    Vitals 01/29/2014 10:19 AM BP: 146/88 (Sitting, Right Arm, Standard)   Physical Exam The physical exam findings are as follows:   General Mental Status - Alert, cooperative and good historian. General Appearance- pleasant. Not in acute distress. Orientation- Oriented X3. Build & Nutrition- Well nourished and Well developed.   Head and Neck Head- normocephalic, atraumatic . Neck Global Assessment- bruit auscultated on the right (very faint bruit) and supple. no bruit auscultated on the left.   Eye Pupil- Bilateral- Regular and Round. Motion- Bilateral- EOMI.   Chest and Lung Exam Auscultation: Breath sounds:- clear at anterior chest wall and - clear at posterior chest wall. Adventitious sounds:- No Adventitious sounds.   Cardiovascular Auscultation:Rhythm- Regular rate and rhythm. Heart Sounds- S1 WNL  and S2 WNL. Murmurs & Other Heart Sounds:Auscultation of the heart reveals - No Murmurs.   Abdomen Palpation/Percussion:Tenderness- Abdomen is non-tender to palpation. Rigidity (guarding)- Abdomen is soft. Auscultation:Auscultation of the abdomen reveals - Bowel sounds normal.   Female Genitourinary Not done, not pertinent to present illness  Musculoskeletal On exam, she is alert and oriented in no apparent distress. Evaluation of her right knee no effusion. Range is 0-125. Some crepitus on range of motion. No instability or significant tenderness. Left knee trace effusion. Range is about 5-120. Marked crepitus on range of motion. Significant tenderness medial greater than lateral with no instability noted.  RADIOGRAPHS: Radiographs are reviewed. AP both knees and lateral of the left taken today showing significant arthritis medial and patellofemoral on the left knee but no evidence of any acute findings.   Assessment & Plan Primary osteoarthritis of one knee (715.16) Impression: Left Knee  Note: Plan is for a Left Total Knee Replacement by Dr. Wynelle Link.  Plan is to go to inpatient rehab for therapy.  PCP - Dr. Reynaldo Minium - Patient has been seen preoperatively and felt to be stable for surgery.  The patient will not receive TXA (tranexamic acid) due to: Left Carotid Disease  Please note that the patient does get nauseated with anesthesia.  Signed electronically by Joelene Millin, III PA-C

## 2014-02-01 ENCOUNTER — Ambulatory Visit (HOSPITAL_COMMUNITY): Payer: Federal, State, Local not specified - PPO

## 2014-02-01 NOTE — Patient Instructions (Signed)
      Your procedure is scheduled on:  02/08/14  MONDAY  Report to [place] at 0500      AM.  Call this number if you have problems the morning of surgery: [phone]        Do not eat food  Or drink :After Midnight.SUNDAY NIGHT   Take these medicines the morning of surgery with A SIP OF WATER: NONE   .  Contacts, dentures or partial plates, or metal hairpins  can not be worn to surgery. Your family will be responsible for glasses, dentures, hearing aides while you are in surgery  Leave suitcase in the car. After surgery it may be brought to your room.  For patients admitted to the hospital, checkout time is 11:00 AM day of  discharge.                DO NOT WEAR JEWELRY, LOTIONS, POWDERS, OR PERFUMES.  WOMEN-- DO NOT SHAVE LEGS OR UNDERARMS FOR 48 HOURS BEFORE SHOWERS. MEN MAY SHAVE FACE.  Patients discharged the day of surgery will not be allowed to drive home. IF going home the day of surgery, you must have a driver and someone to stay with you for the first 24 hours  Name and phone number of your driver:     ADMISSION                                                                   Please read over the following fact sheets that you were given: MRSA Information, Incentive Spirometry Sheet, Blood Transfusion Sheet  Information                     FAILURE TO [facility] [place]                                                  Patient Signature _____________________________

## 2014-02-01 NOTE — Progress Notes (Signed)
Clearance Dr Reynaldo Minium chart, chest x ray with LOV Dr Lamonte Sakai 5/14 chart

## 2014-02-02 ENCOUNTER — Other Ambulatory Visit: Payer: Self-pay

## 2014-02-02 ENCOUNTER — Encounter (HOSPITAL_COMMUNITY): Payer: Self-pay

## 2014-02-02 ENCOUNTER — Encounter (HOSPITAL_COMMUNITY)
Admission: RE | Admit: 2014-02-02 | Discharge: 2014-02-02 | Disposition: A | Discharge status: Home / Self Care | Payer: Federal, State, Local not specified - PPO | Source: Ambulatory Visit | Attending: Orthopedic Surgery | Admitting: Orthopedic Surgery

## 2014-02-02 HISTORY — DX: Sleep apnea, unspecified: G47.30

## 2014-02-02 HISTORY — DX: Other specified postprocedural states: R11.2

## 2014-02-02 HISTORY — DX: Personal history of other medical treatment: Z92.89

## 2014-02-02 HISTORY — DX: Other specified postprocedural states: Z98.890

## 2014-02-02 LAB — COMPREHENSIVE METABOLIC PANEL
ALT: 18 U/L (ref 0–35)
AST: 17 U/L (ref 0–37)
Albumin: 3.6 g/dL (ref 3.5–5.2)
Alkaline Phosphatase: 84 U/L (ref 39–117)
BILIRUBIN TOTAL: 0.3 mg/dL (ref 0.3–1.2)
BUN: 11 mg/dL (ref 6–23)
CHLORIDE: 100 meq/L (ref 96–112)
CO2: 29 mEq/L (ref 19–32)
Calcium: 10.6 mg/dL — ABNORMAL HIGH (ref 8.4–10.5)
Creatinine, Ser: 0.66 mg/dL (ref 0.50–1.10)
GFR calc Af Amer: >90 mL/min (ref >90)
GLUCOSE: 116 mg/dL — AB (ref 70–99)
Potassium: 3.5 mEq/L — ABNORMAL LOW (ref 3.7–5.3)
Sodium: 140 mEq/L (ref 137–147)
Total Protein: 7.6 g/dL (ref 6.0–8.3)

## 2014-02-02 LAB — URINALYSIS, ROUTINE W REFLEX MICROSCOPIC
BILIRUBIN URINE: NEGATIVE
GLUCOSE, UA: NEGATIVE mg/dL
Hgb urine dipstick: NEGATIVE
Ketones, ur: NEGATIVE mg/dL
LEUKOCYTES UA: NEGATIVE
Nitrite: NEGATIVE
PH: 5 (ref 5.0–8.0)
Protein, ur: NEGATIVE mg/dL
Specific Gravity, Urine: 1.025 (ref 1.005–1.030)
Urobilinogen, UA: 0.2 mg/dL (ref 0.0–1.0)

## 2014-02-02 LAB — CBC
HEMATOCRIT: 37.5 % (ref 36.0–46.0)
Hemoglobin: 12.7 g/dL (ref 12.0–15.0)
MCH: 30.5 pg (ref 26.0–34.0)
MCHC: 33.9 g/dL (ref 30.0–36.0)
MCV: 89.9 fL (ref 78.0–100.0)
Platelets: 290 10*3/uL (ref 150–400)
RBC: 4.17 MIL/uL (ref 3.87–5.11)
RDW: 13.3 % (ref 11.5–15.5)
WBC: 6.7 10*3/uL (ref 4.0–10.5)

## 2014-02-02 LAB — PROTIME-INR
INR: 0.96 (ref 0.00–1.49)
PROTHROMBIN TIME: 12.6 s (ref 11.6–15.2)

## 2014-02-02 LAB — SURGICAL PCR SCREEN
MRSA, PCR: NEGATIVE
Staphylococcus aureus: NEGATIVE

## 2014-02-02 LAB — ABO/RH: ABO/RH(D): A POS

## 2014-02-02 LAB — APTT: aPTT: 30 seconds (ref 24–37)

## 2014-02-02 NOTE — Progress Notes (Signed)
States in 2005 "received too much heparin and was unconscious x 1 week" following surgery/procedure.Marland KitchenMarland KitchenInstructed patient to make Dr Wynelle Link aware of this too

## 2014-02-04 ENCOUNTER — Ambulatory Visit (HOSPITAL_COMMUNITY)
Admission: RE | Admit: 2014-02-04 | Discharge: 2014-02-04 | Disposition: A | Discharge status: Home / Self Care | Payer: Federal, State, Local not specified - PPO | Source: Ambulatory Visit | Attending: Interventional Radiology | Admitting: Interventional Radiology

## 2014-02-04 DIAGNOSIS — I771 Stricture of artery: Secondary | ICD-10-CM | POA: Insufficient documentation

## 2014-02-04 DIAGNOSIS — I729 Aneurysm of unspecified site: Secondary | ICD-10-CM

## 2014-02-04 DIAGNOSIS — Z09 Encounter for follow-up examination after completed treatment for conditions other than malignant neoplasm: Secondary | ICD-10-CM | POA: Insufficient documentation

## 2014-02-04 DIAGNOSIS — Z8679 Personal history of other diseases of the circulatory system: Secondary | ICD-10-CM | POA: Insufficient documentation

## 2014-02-04 MED ORDER — GADOBENATE DIMEGLUMINE 529 MG/ML IV SOLN
18.0000 mL | Freq: Once | INTRAVENOUS | Status: AC | PRN
  Administered 2014-02-04: 18 mL via INTRAVENOUS

## 2014-02-05 NOTE — Progress Notes (Signed)
MRA report  Of neck, MR head and brain from 02/04/14 EPIC

## 2014-02-07 NOTE — Anesthesia Preprocedure Evaluation (Addendum)
Anesthesia Evaluation  Patient identified by MRN, date of birth, ID band Patient awake    Reviewed: Allergy & Precautions, H&P , NPO status , Patient's Chart, lab work & pertinent test results  History of Anesthesia Complications (+) PONV and PROLONGED EMERGENCE  Airway Mallampati: III TM Distance: >3 FB Neck ROM: Full    Dental  (+) Teeth Intact, Dental Advisory Given   Pulmonary shortness of breath, sleep apnea (Noncompliat with CPAP) ,  breath sounds clear to auscultation  Pulmonary exam normal       Cardiovascular hypertension, Pt. on medications + Peripheral Vascular Disease (Hx of carotid artery aneurysm) negative cardio ROS  Rhythm:Regular Rate:Normal     Neuro/Psych negative neurological ROS  negative psych ROS   GI/Hepatic negative GI ROS, Neg liver ROS,   Endo/Other  negative endocrine ROS  Renal/GU negative Renal ROS  negative genitourinary   Musculoskeletal  (+) Arthritis -, Rheumatoid disorders,    Abdominal   Peds negative pediatric ROS (+)  Hematology negative hematology ROS (+)   Anesthesia Other Findings   Reproductive/Obstetrics                         Anesthesia Physical Anesthesia Plan  ASA: III  Anesthesia Plan: Spinal   Post-op Pain Management:    Induction: Intravenous  Airway Management Planned: Simple Face Mask  Additional Equipment:   Intra-op Plan:   Post-operative Plan:   Informed Consent: I have reviewed the patients History and Physical, chart, labs and discussed the procedure including the risks, benefits and alternatives for the proposed anesthesia with the patient or authorized representative who has indicated his/her understanding and acceptance.   Dental advisory given  Plan Discussed with: CRNA  Anesthesia Plan Comments:         Anesthesia Quick Evaluation

## 2014-02-08 ENCOUNTER — Encounter (HOSPITAL_COMMUNITY)
Admission: RE | Disposition: A | Discharge status: Skilled Nursing Facility | Payer: Self-pay | Source: Ambulatory Visit | Attending: Orthopedic Surgery

## 2014-02-08 ENCOUNTER — Encounter (HOSPITAL_COMMUNITY): Payer: Self-pay | Admitting: *Deleted

## 2014-02-08 ENCOUNTER — Encounter (HOSPITAL_COMMUNITY): Payer: Federal, State, Local not specified - PPO | Admitting: Anesthesiology

## 2014-02-08 ENCOUNTER — Inpatient Hospital Stay (HOSPITAL_COMMUNITY): Payer: Federal, State, Local not specified - PPO | Admitting: Anesthesiology

## 2014-02-08 ENCOUNTER — Inpatient Hospital Stay (HOSPITAL_COMMUNITY)
Admission: RE | Admit: 2014-02-08 | Discharge: 2014-02-12 | DRG: 470 | Disposition: A | Discharge status: Skilled Nursing Facility | Payer: Federal, State, Local not specified - PPO | Source: Ambulatory Visit | Attending: Orthopedic Surgery | Admitting: Orthopedic Surgery

## 2014-02-08 DIAGNOSIS — E876 Hypokalemia: Secondary | ICD-10-CM | POA: Diagnosis not present

## 2014-02-08 DIAGNOSIS — Z96652 Presence of left artificial knee joint: Secondary | ICD-10-CM

## 2014-02-08 DIAGNOSIS — M179 Osteoarthritis of knee, unspecified: Secondary | ICD-10-CM | POA: Diagnosis present

## 2014-02-08 DIAGNOSIS — Z7982 Long term (current) use of aspirin: Secondary | ICD-10-CM

## 2014-02-08 DIAGNOSIS — G4730 Sleep apnea, unspecified: Secondary | ICD-10-CM | POA: Diagnosis present

## 2014-02-08 DIAGNOSIS — M949 Disorder of cartilage, unspecified: Secondary | ICD-10-CM

## 2014-02-08 DIAGNOSIS — I671 Cerebral aneurysm, nonruptured: Secondary | ICD-10-CM | POA: Diagnosis present

## 2014-02-08 DIAGNOSIS — I10 Essential (primary) hypertension: Secondary | ICD-10-CM | POA: Diagnosis present

## 2014-02-08 DIAGNOSIS — M069 Rheumatoid arthritis, unspecified: Secondary | ICD-10-CM | POA: Diagnosis present

## 2014-02-08 DIAGNOSIS — Z01812 Encounter for preprocedural laboratory examination: Secondary | ICD-10-CM

## 2014-02-08 DIAGNOSIS — Z6834 Body mass index (BMI) 34.0-34.9, adult: Secondary | ICD-10-CM

## 2014-02-08 DIAGNOSIS — M1710 Unilateral primary osteoarthritis, unspecified knee: Secondary | ICD-10-CM | POA: Diagnosis present

## 2014-02-08 DIAGNOSIS — Z79899 Other long term (current) drug therapy: Secondary | ICD-10-CM

## 2014-02-08 DIAGNOSIS — Z01810 Encounter for preprocedural cardiovascular examination: Secondary | ICD-10-CM

## 2014-02-08 DIAGNOSIS — M899 Disorder of bone, unspecified: Secondary | ICD-10-CM | POA: Diagnosis present

## 2014-02-08 DIAGNOSIS — D62 Acute posthemorrhagic anemia: Secondary | ICD-10-CM | POA: Diagnosis not present

## 2014-02-08 DIAGNOSIS — Z8249 Family history of ischemic heart disease and other diseases of the circulatory system: Secondary | ICD-10-CM

## 2014-02-08 HISTORY — PX: TOTAL KNEE ARTHROPLASTY: SHX125

## 2014-02-08 LAB — TYPE AND SCREEN
ABO/RH(D): A POS
Antibody Screen: NEGATIVE

## 2014-02-08 SURGERY — ARTHROPLASTY, KNEE, TOTAL
Anesthesia: Spinal | Site: Knee | Laterality: Left

## 2014-02-08 MED ORDER — MENTHOL 3 MG MT LOZG
1.0000 | LOZENGE | OROMUCOSAL | Status: DC | PRN
  Filled 2014-02-08: qty 9

## 2014-02-08 MED ORDER — PHENOL 1.4 % MT LIQD: 1.0000 | OROMUCOSAL | Status: DC | PRN

## 2014-02-08 MED ORDER — PROPOFOL 10 MG/ML IV BOLUS
INTRAVENOUS | Status: AC
  Filled 2014-02-08: qty 20

## 2014-02-08 MED ORDER — PROPOFOL INFUSION 10 MG/ML OPTIME
INTRAVENOUS | Status: DC | PRN
  Administered 2014-02-08: 100 ug/kg/min via INTRAVENOUS

## 2014-02-08 MED ORDER — BISACODYL 10 MG RE SUPP: 10.0000 mg | Freq: Every day | RECTAL | Status: DC | PRN

## 2014-02-08 MED ORDER — OXYCODONE HCL 5 MG PO TABS
5.0000 mg | ORAL_TABLET | ORAL | Status: DC | PRN
  Administered 2014-02-08: 10 mg via ORAL
  Administered 2014-02-08: 5 mg via ORAL
  Administered 2014-02-09: 10 mg via ORAL
  Filled 2014-02-08: qty 1
  Filled 2014-02-08 (×2): qty 2

## 2014-02-08 MED ORDER — DIPHENHYDRAMINE HCL 12.5 MG/5ML PO ELIX: 12.5000 mg | ORAL_SOLUTION | ORAL | Status: DC | PRN

## 2014-02-08 MED ORDER — ONDANSETRON HCL 4 MG/2ML IJ SOLN
4.0000 mg | Freq: Four times a day (QID) | INTRAMUSCULAR | Status: DC | PRN
  Administered 2014-02-08 – 2014-02-09 (×2): 4 mg via INTRAVENOUS
  Filled 2014-02-08 (×3): qty 2

## 2014-02-08 MED ORDER — DEXAMETHASONE SODIUM PHOSPHATE 10 MG/ML IJ SOLN
10.0000 mg | Freq: Every day | INTRAMUSCULAR | Status: AC
  Filled 2014-02-08: qty 1

## 2014-02-08 MED ORDER — CEFAZOLIN SODIUM-DEXTROSE 2-3 GM-% IV SOLR
INTRAVENOUS | Status: AC
  Filled 2014-02-08: qty 50

## 2014-02-08 MED ORDER — CEFAZOLIN SODIUM-DEXTROSE 2-3 GM-% IV SOLR
2.0000 g | Freq: Four times a day (QID) | INTRAVENOUS | Status: AC
  Administered 2014-02-08 (×2): 2 g via INTRAVENOUS
  Filled 2014-02-08 (×2): qty 50

## 2014-02-08 MED ORDER — DEXAMETHASONE SODIUM PHOSPHATE 10 MG/ML IJ SOLN
10.0000 mg | Freq: Once | INTRAMUSCULAR | Status: AC
  Administered 2014-02-08: 10 mg via INTRAVENOUS

## 2014-02-08 MED ORDER — PHENYLEPHRINE HCL 10 MG/ML IJ SOLN
INTRAMUSCULAR | Status: AC
  Filled 2014-02-08: qty 1

## 2014-02-08 MED ORDER — ACETAMINOPHEN 500 MG PO TABS
1000.0000 mg | ORAL_TABLET | Freq: Four times a day (QID) | ORAL | Status: AC
  Administered 2014-02-08 – 2014-02-09 (×3): 1000 mg via ORAL
  Filled 2014-02-08 (×5): qty 2

## 2014-02-08 MED ORDER — ACETAMINOPHEN 650 MG RE SUPP: 650.0000 mg | Freq: Four times a day (QID) | RECTAL | Status: DC | PRN

## 2014-02-08 MED ORDER — EPHEDRINE SULFATE 50 MG/ML IJ SOLN
INTRAMUSCULAR | Status: AC
  Filled 2014-02-08: qty 1

## 2014-02-08 MED ORDER — LIDOCAINE HCL (CARDIAC) 20 MG/ML IV SOLN
INTRAVENOUS | Status: AC
  Filled 2014-02-08: qty 5

## 2014-02-08 MED ORDER — ONDANSETRON HCL 4 MG PO TABS: 4.0000 mg | ORAL_TABLET | Freq: Four times a day (QID) | ORAL | Status: DC | PRN

## 2014-02-08 MED ORDER — DEXAMETHASONE 4 MG PO TABS
10.0000 mg | ORAL_TABLET | Freq: Every day | ORAL | Status: AC
  Administered 2014-02-09: 10 mg via ORAL
  Filled 2014-02-08: qty 1

## 2014-02-08 MED ORDER — POTASSIUM CHLORIDE CRYS ER 20 MEQ PO TBCR
20.0000 meq | EXTENDED_RELEASE_TABLET | Freq: Every day | ORAL | Status: DC
  Administered 2014-02-08 – 2014-02-12 (×5): 20 meq via ORAL
  Filled 2014-02-08 (×5): qty 1

## 2014-02-08 MED ORDER — HYDROCHLOROTHIAZIDE 12.5 MG PO CAPS
12.5000 mg | ORAL_CAPSULE | Freq: Every day | ORAL | Status: DC
  Administered 2014-02-08 – 2014-02-11 (×4): 12.5 mg via ORAL
  Filled 2014-02-08 (×5): qty 1

## 2014-02-08 MED ORDER — HYDROMORPHONE HCL PF 1 MG/ML IJ SOLN: 0.2500 mg | INTRAMUSCULAR | Status: DC | PRN

## 2014-02-08 MED ORDER — CEFAZOLIN SODIUM-DEXTROSE 2-3 GM-% IV SOLR
2.0000 g | INTRAVENOUS | Status: AC
  Administered 2014-02-08: 2 g via INTRAVENOUS

## 2014-02-08 MED ORDER — METOCLOPRAMIDE HCL 10 MG PO TABS: 5.0000 mg | ORAL_TABLET | Freq: Three times a day (TID) | ORAL | Status: DC | PRN

## 2014-02-08 MED ORDER — BUPIVACAINE IN DEXTROSE 0.75-8.25 % IT SOLN
INTRATHECAL | Status: DC | PRN
  Administered 2014-02-08: 1.8 mL via INTRATHECAL

## 2014-02-08 MED ORDER — BUPIVACAINE HCL (PF) 0.25 % IJ SOLN
INTRAMUSCULAR | Status: AC
  Filled 2014-02-08: qty 30

## 2014-02-08 MED ORDER — PROMETHAZINE HCL 25 MG/ML IJ SOLN: 6.2500 mg | INTRAMUSCULAR | Status: DC | PRN

## 2014-02-08 MED ORDER — SODIUM CHLORIDE 0.9 % IJ SOLN
INTRAMUSCULAR | Status: AC
  Filled 2014-02-08: qty 50

## 2014-02-08 MED ORDER — METHOCARBAMOL 500 MG PO TABS
500.0000 mg | ORAL_TABLET | Freq: Four times a day (QID) | ORAL | Status: DC | PRN
  Administered 2014-02-09 – 2014-02-12 (×4): 500 mg via ORAL
  Filled 2014-02-08 (×3): qty 1

## 2014-02-08 MED ORDER — TRAMADOL HCL 50 MG PO TABS: 50.0000 mg | ORAL_TABLET | Freq: Four times a day (QID) | ORAL | Status: DC | PRN

## 2014-02-08 MED ORDER — IRBESARTAN 150 MG PO TABS
150.0000 mg | ORAL_TABLET | Freq: Every day | ORAL | Status: DC
  Administered 2014-02-08 – 2014-02-11 (×4): 150 mg via ORAL
  Filled 2014-02-08 (×5): qty 1

## 2014-02-08 MED ORDER — 0.9 % SODIUM CHLORIDE (POUR BTL) OPTIME
TOPICAL | Status: DC | PRN
  Administered 2014-02-08: 1000 mL

## 2014-02-08 MED ORDER — LACTATED RINGERS IV SOLN
INTRAVENOUS | Status: DC | PRN
  Administered 2014-02-08 (×3): via INTRAVENOUS

## 2014-02-08 MED ORDER — SODIUM CHLORIDE 0.9 % IJ SOLN
INTRAMUSCULAR | Status: AC
  Filled 2014-02-08: qty 10

## 2014-02-08 MED ORDER — METHOCARBAMOL 100 MG/ML IJ SOLN
500.0000 mg | Freq: Four times a day (QID) | INTRAVENOUS | Status: DC | PRN
  Administered 2014-02-08: 500 mg via INTRAVENOUS
  Filled 2014-02-08: qty 5

## 2014-02-08 MED ORDER — KETAMINE HCL 10 MG/ML IJ SOLN
INTRAMUSCULAR | Status: DC | PRN
  Administered 2014-02-08 (×4): 10 mg via INTRAVENOUS

## 2014-02-08 MED ORDER — MIDAZOLAM HCL 2 MG/2ML IJ SOLN
INTRAMUSCULAR | Status: AC
  Filled 2014-02-08: qty 2

## 2014-02-08 MED ORDER — VALSARTAN-HYDROCHLOROTHIAZIDE 160-12.5 MG PO TABS: 1.0000 | ORAL_TABLET | Freq: Every day | ORAL | Status: DC

## 2014-02-08 MED ORDER — DOCUSATE SODIUM 100 MG PO CAPS
100.0000 mg | ORAL_CAPSULE | Freq: Two times a day (BID) | ORAL | Status: DC
  Administered 2014-02-08 – 2014-02-12 (×8): 100 mg via ORAL
  Filled 2014-02-08: qty 1

## 2014-02-08 MED ORDER — ONDANSETRON HCL 4 MG/2ML IJ SOLN
INTRAMUSCULAR | Status: AC
  Filled 2014-02-08: qty 2

## 2014-02-08 MED ORDER — RIVAROXABAN 10 MG PO TABS
10.0000 mg | ORAL_TABLET | Freq: Every day | ORAL | Status: DC
  Administered 2014-02-09 – 2014-02-12 (×4): 10 mg via ORAL
  Filled 2014-02-08 (×6): qty 1

## 2014-02-08 MED ORDER — KETOROLAC TROMETHAMINE 15 MG/ML IJ SOLN
7.5000 mg | Freq: Four times a day (QID) | INTRAMUSCULAR | Status: AC | PRN
  Administered 2014-02-08: 7.5 mg via INTRAVENOUS
  Filled 2014-02-08 (×2): qty 1

## 2014-02-08 MED ORDER — POLYETHYLENE GLYCOL 3350 17 G PO PACK
17.0000 g | PACK | Freq: Every day | ORAL | Status: DC | PRN
  Filled 2014-02-08: qty 1

## 2014-02-08 MED ORDER — BUPIVACAINE HCL 0.25 % IJ SOLN
INTRAMUSCULAR | Status: DC | PRN
  Administered 2014-02-08: 20 mL

## 2014-02-08 MED ORDER — SODIUM CHLORIDE 0.9 % IV SOLN: INTRAVENOUS | Status: DC

## 2014-02-08 MED ORDER — FLEET ENEMA 7-19 GM/118ML RE ENEM: 1.0000 | ENEMA | Freq: Once | RECTAL | Status: AC | PRN

## 2014-02-08 MED ORDER — POTASSIUM CHLORIDE IN NACL 20-0.9 MEQ/L-% IV SOLN
INTRAVENOUS | Status: DC
  Administered 2014-02-08 – 2014-02-09 (×4): via INTRAVENOUS
  Filled 2014-02-08 (×4): qty 1000

## 2014-02-08 MED ORDER — PHENYLEPHRINE HCL 10 MG/ML IJ SOLN
10.0000 mg | INTRAVENOUS | Status: DC | PRN
  Administered 2014-02-08: 10 ug/min via INTRAVENOUS

## 2014-02-08 MED ORDER — KETAMINE HCL 10 MG/ML IJ SOLN
INTRAMUSCULAR | Status: AC
  Filled 2014-02-08: qty 1

## 2014-02-08 MED ORDER — ONDANSETRON HCL 4 MG/2ML IJ SOLN
INTRAMUSCULAR | Status: DC | PRN
  Administered 2014-02-08: 4 mg via INTRAVENOUS

## 2014-02-08 MED ORDER — EPHEDRINE SULFATE 50 MG/ML IJ SOLN
INTRAMUSCULAR | Status: DC | PRN
  Administered 2014-02-08: 5 mg via INTRAVENOUS

## 2014-02-08 MED ORDER — ACETAMINOPHEN 325 MG PO TABS: 650.0000 mg | ORAL_TABLET | Freq: Four times a day (QID) | ORAL | Status: DC | PRN

## 2014-02-08 MED ORDER — LACTATED RINGERS IV SOLN: INTRAVENOUS | Status: DC

## 2014-02-08 MED ORDER — BUPIVACAINE LIPOSOME 1.3 % IJ SUSP
INTRAMUSCULAR | Status: DC | PRN
  Administered 2014-02-08: 20 mL

## 2014-02-08 MED ORDER — BUPIVACAINE LIPOSOME 1.3 % IJ SUSP
20.0000 mL | Freq: Once | INTRAMUSCULAR | Status: DC
  Filled 2014-02-08: qty 20

## 2014-02-08 MED ORDER — MIDAZOLAM HCL 5 MG/5ML IJ SOLN
INTRAMUSCULAR | Status: DC | PRN
  Administered 2014-02-08: 2 mg via INTRAVENOUS

## 2014-02-08 MED ORDER — METOCLOPRAMIDE HCL 5 MG/ML IJ SOLN
5.0000 mg | Freq: Three times a day (TID) | INTRAMUSCULAR | Status: DC | PRN
  Filled 2014-02-08: qty 2

## 2014-02-08 MED ORDER — MORPHINE SULFATE 2 MG/ML IJ SOLN: 1.0000 mg | INTRAMUSCULAR | Status: DC | PRN

## 2014-02-08 MED ORDER — DEXAMETHASONE SODIUM PHOSPHATE 10 MG/ML IJ SOLN
INTRAMUSCULAR | Status: AC
  Filled 2014-02-08: qty 1

## 2014-02-08 MED ORDER — ACETAMINOPHEN 10 MG/ML IV SOLN
1000.0000 mg | Freq: Once | INTRAVENOUS | Status: AC
  Administered 2014-02-08: 1000 mg via INTRAVENOUS
  Filled 2014-02-08: qty 100

## 2014-02-08 MED ORDER — SODIUM CHLORIDE 0.9 % IJ SOLN
INTRAMUSCULAR | Status: DC | PRN
  Administered 2014-02-08: 30 mL via INTRAVENOUS

## 2014-02-08 SURGICAL SUPPLY — 56 items
BAG SPEC THK2 15X12 ZIP CLS (MISCELLANEOUS) ×1
BAG ZIPLOCK 12X15 (MISCELLANEOUS) ×2 IMPLANT
BANDAGE ELASTIC 6 VELCRO ST LF (GAUZE/BANDAGES/DRESSINGS) ×2 IMPLANT
BANDAGE ESMARK 6X9 LF (GAUZE/BANDAGES/DRESSINGS) ×1 IMPLANT
BLADE SAG 18X100X1.27 (BLADE) ×2 IMPLANT
BLADE SAW SGTL 11.0X1.19X90.0M (BLADE) ×2 IMPLANT
BNDG CMPR 9X6 STRL LF SNTH (GAUZE/BANDAGES/DRESSINGS) ×1
BNDG ESMARK 6X9 LF (GAUZE/BANDAGES/DRESSINGS) ×2
BOWL SMART MIX CTS (DISPOSABLE) ×2 IMPLANT
CAP KNEE ATTUNE RP ×1 IMPLANT
CEMENT HV SMART SET (Cement) ×4 IMPLANT
CUFF TOURN SGL QUICK 34 (TOURNIQUET CUFF) ×2
CUFF TRNQT CYL 34X4X40X1 (TOURNIQUET CUFF) ×1 IMPLANT
DECANTER SPIKE VIAL GLASS SM (MISCELLANEOUS) ×2 IMPLANT
DRAPE EXTREMITY T 121X128X90 (DRAPE) ×2 IMPLANT
DRAPE POUCH INSTRU U-SHP 10X18 (DRAPES) ×2 IMPLANT
DRAPE U-SHAPE 47X51 STRL (DRAPES) ×2 IMPLANT
DRSG ADAPTIC 3X8 NADH LF (GAUZE/BANDAGES/DRESSINGS) ×2 IMPLANT
DRSG PAD ABDOMINAL 8X10 ST (GAUZE/BANDAGES/DRESSINGS) ×2 IMPLANT
DURAPREP 26ML APPLICATOR (WOUND CARE) ×2 IMPLANT
ELECT REM PT RETURN 9FT ADLT (ELECTROSURGICAL) ×2
ELECTRODE REM PT RTRN 9FT ADLT (ELECTROSURGICAL) ×1 IMPLANT
EVACUATOR 1/8 PVC DRAIN (DRAIN) ×2 IMPLANT
FACESHIELD LNG OPTICON STERILE (SAFETY) ×10 IMPLANT
GLOVE BIO SURGEON STRL SZ7.5 (GLOVE) IMPLANT
GLOVE BIO SURGEON STRL SZ8 (GLOVE) ×2 IMPLANT
GLOVE BIOGEL PI IND STRL 8 (GLOVE) ×2 IMPLANT
GLOVE BIOGEL PI INDICATOR 8 (GLOVE) ×2
GLOVE SURG SS PI 6.5 STRL IVOR (GLOVE) ×2 IMPLANT
GOWN STRL REUS W/TWL LRG LVL3 (GOWN DISPOSABLE) ×2 IMPLANT
GOWN STRL REUS W/TWL XL LVL3 (GOWN DISPOSABLE) IMPLANT
HANDPIECE INTERPULSE COAX TIP (DISPOSABLE) ×2
IMMOBILIZER KNEE 20 (SOFTGOODS) ×2 IMPLANT
KIT BASIN OR (CUSTOM PROCEDURE TRAY) ×2 IMPLANT
MANIFOLD NEPTUNE II (INSTRUMENTS) ×2 IMPLANT
NDL SAFETY ECLIPSE 18X1.5 (NEEDLE) ×2 IMPLANT
NEEDLE HYPO 18GX1.5 SHARP (NEEDLE) ×4
NS IRRIG 1000ML POUR BTL (IV SOLUTION) ×2 IMPLANT
PACK TOTAL JOINT (CUSTOM PROCEDURE TRAY) ×2 IMPLANT
PADDING CAST COTTON 6X4 STRL (CAST SUPPLIES) ×4 IMPLANT
POSITIONER SURGICAL ARM (MISCELLANEOUS) ×2 IMPLANT
SET HNDPC FAN SPRY TIP SCT (DISPOSABLE) ×1 IMPLANT
SPONGE GAUZE 4X4 12PLY (GAUZE/BANDAGES/DRESSINGS) ×2 IMPLANT
STRIP CLOSURE SKIN 1/2X4 (GAUZE/BANDAGES/DRESSINGS) ×4 IMPLANT
SUCTION FRAZIER 12FR DISP (SUCTIONS) ×2 IMPLANT
SUT MNCRL AB 4-0 PS2 18 (SUTURE) ×2 IMPLANT
SUT VIC AB 2-0 CT1 27 (SUTURE) ×6
SUT VIC AB 2-0 CT1 TAPERPNT 27 (SUTURE) ×3 IMPLANT
SUT VLOC 180 0 24IN GS25 (SUTURE) ×2 IMPLANT
SYR 20CC LL (SYRINGE) ×2 IMPLANT
SYR 50ML LL SCALE MARK (SYRINGE) ×2 IMPLANT
TOWEL OR 17X26 10 PK STRL BLUE (TOWEL DISPOSABLE) ×2 IMPLANT
TOWEL OR NON WOVEN STRL DISP B (DISPOSABLE) IMPLANT
TRAY FOLEY CATH 14FRSI W/METER (CATHETERS) ×2 IMPLANT
WATER STERILE IRR 1500ML POUR (IV SOLUTION) ×2 IMPLANT
WRAP KNEE MAXI GEL POST OP (GAUZE/BANDAGES/DRESSINGS) ×2 IMPLANT

## 2014-02-08 NOTE — Progress Notes (Signed)
Clinical Social Work Department BRIEF PSYCHOSOCIAL ASSESSMENT 02/08/2014  Patient:  Joanna Lee, Joanna Lee     Account Number:  [id number]     Admit date:  02/08/2014  Clinical Social Worker:  Lacie Scotts  Date/Time:  02/08/2014 01:01 PM  Referred by:  Physician  Date Referred:  02/08/2014 Referred for  SNF Placement   Other Referral:   Interview type:  Patient Other interview type:    PSYCHOSOCIAL DATA Living Status:  ALONE Admitted from facility:   Level of care:   Primary support name:  Fortunato Curling Primary support relationship to patient:  SIBLING Degree of support available:   limited    CURRENT CONCERNS Current Concerns  Post-Acute Placement   Other Concerns:    SOCIAL WORK ASSESSMENT / PLAN Pt is a 63 yr old female living at home prior to hospitalization. CSW spoke with pt several weeks ago, as well as this afternoon , to assist with d/c planning. Pt lives alone and states she doesn't have 24/7 support at home  following hospital d/c. Pt has [place] insurance which does not have a SNF benefit. Pt's [employer] does have an acute rehab benefit . PT / CIR contacted to update them on pt's disposition concerns. CSW will continue to follow to assist with d/c planning.   Assessment/plan status:  Psychosocial Support/Ongoing Assessment of Needs Other assessment/ plan:   Information/referral to community resources:   Insurance coverage for SNF vs CIR reviewed.    PATIENT'S/FAMILY'S RESPONSE TO PLAN OF CARE: Pt is happy that her surgery is done. She recalls speaking with CSW a couple of weeks ago and still feels referral to CIR is needed. Pt is not able to pay out of pocket for rehab placement if CIR cannot assist. CSW has encouraged pt to reach out to family for support but pt does not feel comfortable asking someone to stay with following hospital d/c.

## 2014-02-08 NOTE — Anesthesia Postprocedure Evaluation (Signed)
Anesthesia Post Note  Patient: Joanna Lee  Procedure(s) Performed: Procedure(s) (LRB): LEFT TOTAL KNEE ARTHROPLASTY (Left)  Anesthesia type: Spinal  Patient location: PACU  Post pain: Pain level controlled  Post assessment: Post-op Vital signs reviewed  Last Vitals:  Filed Vitals:   02/08/14 1330  BP: 100/62  Pulse: 70  Temp: 36.6 C  Resp: 14    Post vital signs: Reviewed  Level of consciousness: sedated  Complications: No apparent anesthesia complications

## 2014-02-08 NOTE — H&P (View-Only) (Signed)
Joanna Lee DOB: [birth year] Single / Language: [place] / Race: Black or African American Female  Date of Admission:  02-08-2014  Chief Complaint:  Left Knee Pain  History of Present Illness The patient is a 63 year old female who comes in today for a preoperative History and Physical. The patient is scheduled for a left total knee arthroplasty to be performed by Dr. Dione Plover. Aluisio, MD at [facility] on 02-08-2014. The patient is a 63 year old female who presents for follow up of their knee. The patient is being followed for their left knee pain and osteoarthritis. They are out from injury (while in [place]). Symptoms reported today include: pain, swelling (but no more than usual), aching, throbbing, difficulty ambulating and difficulty arising from chair. The patient feels that they are doing poorly and report their pain level to be moderate to severe. Current treatment includes: NSAIDs (Ibuprofen) and pain medications. The following medication has been used for pain control: Percocet. The patient presents today following a fall on 11/03/13. The patient has reported improvement of their symptoms with: activity modification. The patient indicates that they are ready to have surgery after this recent injury. She has had a lot of pain in that left knee since this episode. She said she misstepped and hyperflexed the knees. She had intense pain initially on the left at rest and with activity. Now it is mostly activity related. She has not been able to get back towards any regular activities yet because of the pain. It did swell on her initially but she said the swelling has gotten somewhat better now. She is now ready to proceed with knee replacement surgery. They have been treated conservatively in the past for the above stated problem and despite conservative measures, they continue to have progressive pain and severe functional limitations and dysfunction. They have failed  non-operative management including home exercise, medications. It is felt that they would benefit from undergoing total joint replacement. Risks and benefits of the procedure have been discussed with the patient and they elect to proceed with surgery. There are no active contraindications to surgery such as ongoing infection or rapidly progressive neurological disease.   Allergies No Known Drug Allergies - Patient states that she is sensitive to medications.   Problem List/Past Medical Primary osteoarthritis of one knee (715.16) Rheumatoid Arthritis Migraines Anemia Hypertension Hypercholesterolemia Left Carotid Aneurysm History of Bronchitis Vertigo Rheumatic Fever - Childhood    Family History Diabetes Mellitus. mother Hypertension. mother Cancer. First Degree Relatives. father, sister and brother   Social History Exercise. Exercises daily; does other Pain Contract. no Tobacco use. Never smoker. never smoker Tobacco / smoke exposure. no Number of flights of stairs before winded. less than 1 Most recent primary occupation. [employer] Marital status. single Previously in rehab. no Drug/Alcohol Rehab (Currently). no Living situation. live alone Illicit drug use. no Current work status. working part time Children. 1 Alcohol use. current drinker; drinks wine; only occasionally per week Post-Surgical Plans. Inpatient Rehab   Medication History Potassium Chloride (20MEQ Tablet ER, Oral) Active. Aspirin (81MG  Tablet, Oral) Active. Diovan ( Oral) Specific dose unknown - Active. Multiple Vitamin ( Oral) Active.   Past Surgical History Rotator Cuff Repair. left Hysterectomy. complete (non-cancerous) Arthroscopy of Knee. bilateral Carotid Artery Surgery. left - Coil Procedure/Stent   Review of Systems General:Not Present- Chills, Fever, Night Sweats, Fatigue, Weight Gain, Weight Loss and Memory Loss. Skin:Not Present-  Hives, Itching, Rash, Eczema and Lesions. HEENT:Not Present- Tinnitus, Headache,  Double Vision, Visual Loss, Hearing Loss and Dentures. Respiratory:Not Present- Shortness of breath with exertion, Shortness of breath at rest, Allergies, Coughing up blood and Chronic Cough. Cardiovascular:Not Present- Chest Pain, Racing/skipping heartbeats, Difficulty Breathing Lying Down, Murmur, Swelling and Palpitations. Gastrointestinal:Not Present- Bloody Stool, Heartburn, Abdominal Pain, Vomiting, Nausea, Constipation, Diarrhea, Difficulty Swallowing, Jaundice and Loss of appetitie. Female Genitourinary:Not Present- Blood in Urine, Urinary frequency, Weak urinary stream, Discharge, Flank Pain, Incontinence, Painful Urination, Urgency, Urinary Retention and Urinating at Night. Musculoskeletal:Present- Joint Pain. Not Present- Muscle Weakness, Muscle Pain, Joint Swelling, Back Pain, Morning Stiffness and Spasms. Neurological:Not Present- Tremor, Dizziness, Blackout spells, Paralysis, Difficulty with balance and Weakness. Psychiatric:Not Present- Insomnia.    Vitals 01/29/2014 10:19 AM BP: 146/88 (Sitting, Right Arm, Standard)   Physical Exam The physical exam findings are as follows:   General Mental Status - Alert, cooperative and good historian. General Appearance- pleasant. Not in acute distress. Orientation- Oriented X3. Build & Nutrition- Well nourished and Well developed.   Head and Neck Head- normocephalic, atraumatic . Neck Global Assessment- bruit auscultated on the right (very faint bruit) and supple. no bruit auscultated on the left.   Eye Pupil- Bilateral- Regular and Round. Motion- Bilateral- EOMI.   Chest and Lung Exam Auscultation: Breath sounds:- clear at anterior chest wall and - clear at posterior chest wall. Adventitious sounds:- No Adventitious sounds.   Cardiovascular Auscultation:Rhythm- Regular rate and rhythm. Heart Sounds- S1 WNL  and S2 WNL. Murmurs & Other Heart Sounds:Auscultation of the heart reveals - No Murmurs.   Abdomen Palpation/Percussion:Tenderness- Abdomen is non-tender to palpation. Rigidity (guarding)- Abdomen is soft. Auscultation:Auscultation of the abdomen reveals - Bowel sounds normal.   Female Genitourinary Not done, not pertinent to present illness  Musculoskeletal On exam, she is alert and oriented in no apparent distress. Evaluation of her right knee no effusion. Range is 0-125. Some crepitus on range of motion. No instability or significant tenderness. Left knee trace effusion. Range is about 5-120. Marked crepitus on range of motion. Significant tenderness medial greater than lateral with no instability noted.  RADIOGRAPHS: Radiographs are reviewed. AP both knees and lateral of the left taken today showing significant arthritis medial and patellofemoral on the left knee but no evidence of any acute findings.   Assessment & Plan Primary osteoarthritis of one knee (715.16) Impression: Left Knee  Note: Plan is for a Left Total Knee Replacement by Dr. Wynelle Link.  Plan is to go to inpatient rehab for therapy.  PCP - Dr. Reynaldo Minium - Patient has been seen preoperatively and felt to be stable for surgery.  The patient will not receive TXA (tranexamic acid) due to: Left Carotid Disease  Please note that the patient does get nauseated with anesthesia.  Signed electronically by Joelene Millin, III PA-C

## 2014-02-08 NOTE — Transfer of Care (Signed)
Immediate Anesthesia Transfer of Care Note  Patient: Joanna Lee  Procedure(s) Performed: Procedure(s): LEFT TOTAL KNEE ARTHROPLASTY (Left)  Patient Location: PACU  Anesthesia Type:Spinal  Level of Consciousness: awake, alert  and oriented  Airway & Oxygen Therapy: Patient Spontanous Breathing and Patient connected to face mask oxygen  Post-op Assessment: Report given to PACU RN and Post -op Vital signs reviewed and stable  Post vital signs: Reviewed and stable  Complications: No apparent anesthesia complications

## 2014-02-08 NOTE — Progress Notes (Signed)
PACU note------update given to Dr. Winfred Leeds, spinal level L2, able to move thighs back and forth; OK to go to room

## 2014-02-08 NOTE — Progress Notes (Signed)
Utilization review completed.  

## 2014-02-08 NOTE — Preoperative (Signed)
Beta Blockers   Reason not to administer Beta Blockers:Not Applicable 

## 2014-02-08 NOTE — Interval H&P Note (Signed)
History and Physical Interval Note:  02/08/2014 6:58 AM  Joanna Lee  has presented today for surgery, with the diagnosis of osteoarthritis of the left knee  The various methods of treatment have been discussed with the patient and family. After consideration of risks, benefits and other options for treatment, the patient has consented to  Procedure(s): LEFT TOTAL KNEE ARTHROPLASTY (Left) as a surgical intervention .  The patient's history has been reviewed, patient examined, no change in status, stable for surgery.  I have reviewed the patient's chart and labs.  Questions were answered to the patient's satisfaction.     [name]

## 2014-02-08 NOTE — Evaluation (Signed)
Physical Therapy Evaluation Patient Details Name: Joanna Lee MRN: [id number] DOB: [birth year] Today's Date: 02/08/2014 Time: 4268-3419 PT Time Calculation (min): 29 min  PT Assessment / Plan / Recommendation History of Present Illness  s/p L TKA  Clinical Impression  Pt will benefit from PT to address deficits below; she wants to go to rehab since she has no SNF benefits; will continue;     PT Assessment  Patient needs continued PT services    Follow Up Recommendations  CIR (pt has no SNF benefits and she wants to go to rehab)    Does the patient have the potential to tolerate intense rehabilitation      Barriers to Discharge        Equipment Recommendations  Rolling walker with 5" wheels    Recommendations for Other Services     Frequency 7X/week    Precautions / Restrictions Precautions Precautions: Knee Required Braces or Orthoses: Knee Immobilizer - Left Knee Immobilizer - Left: Discontinue once straight leg raise with < 10 degree lag Restrictions Other Position/Activity Restrictions: WBAT   Pertinent Vitals/Pain Pt with minimal c/o pain, was premedicated     Mobility  Bed Mobility Overal bed mobility: Needs Assistance Bed Mobility: Supine to Sit Supine to sit: Min assist;HOB elevated General bed mobility comments: cues for technique, self assist; Transfers Overall transfer level: Needs assistance Equipment used: Rolling walker (2 wheeled) Transfers: Sit to/from Stand Sit to Stand: Min assist;Mod assist General transfer comment: cues for hand placement Ambulation/Gait Ambulation/Gait assistance: Min assist;Mod assist Ambulation Distance (Feet): 4 Feet Assistive device: Rolling walker (2 wheeled) Gait Pattern/deviations: Antalgic;Step-to pattern    Exercises     PT Diagnosis: Difficulty walking  PT Problem List: Decreased range of motion;Decreased activity tolerance;Decreased balance;Decreased mobility;Decreased knowledge of use of DME;Decreased  knowledge of precautions;Decreased strength PT Treatment Interventions: DME instruction;Gait training;Functional mobility training;Therapeutic activities;Therapeutic exercise;Patient/family education     PT Goals(Current goals can be found in the care plan section) Acute Rehab PT Goals Patient Stated Goal: I; pt wants to go to rehab PT Goal Formulation: With patient Time For Goal Achievement: 02/15/14 Potential to Achieve Goals: Good  Visit Information  Last PT Received On: 02/08/14 Assistance Needed: +2 History of Present Illness: s/p L TKA       Prior Functioning  Home Living Family/patient expects to be discharged to:: Private residence Living Arrangements: Alone Type of Home: House Home Access: Stairs to enter [employer] of Steps: 4 or 6 at back  Entrance Stairs-Rails: Right;Left;Can reach both Home Layout: One [place]: None Prior Function Level of Independence: Independent Communication Communication: No difficulties    Cognition  Cognition Arousal/Alertness: Awake/alert Behavior During Therapy: WFL for tasks assessed/performed Overall Cognitive Status: Within Functional Limits for tasks assessed    Extremity/Trunk Assessment Lower Extremity Assessment Lower Extremity Assessment: LLE deficits/detail LLE Deficits / Details: moves ankle and assists with SLR minimally due to c/o pain LLE: Unable to fully assess due to pain   Balance    End of Session PT - End of Session Equipment Utilized During Treatment: Gait belt Activity Tolerance: Patient tolerated treatment well Patient left: in chair;with call bell/phone within reach CPM Left Knee CPM Left Knee: Off  GP     [facility] 02/08/2014, 3:33 PM

## 2014-02-08 NOTE — Progress Notes (Signed)
Rehab Admissions Coordinator Note:  Patient was screened by Cleatrice Burke for appropriateness for an Inpatient Acute Rehab Consult per PT recommendations due to pt has [employer] without SNF benefits.  At this time, we are recommending Inpatient Rehab consult. Noted pt previously at inpt rehab in 2005. Please order rehab consult to determine if admission would be appropriate.  Cleatrice Burke 02/08/2014, 3:46 PM  I can be reached at [phone].

## 2014-02-08 NOTE — Op Note (Signed)
Pre-operative diagnosis- Osteoarthritis  Left knee(s)  Post-operative diagnosis- Osteoarthritis Left knee(s)  Procedure-  Left  Total Knee Arthroplasty  (Attune system)  Surgeon- Joanna Plover. Katrece Roediger, MD  Assistant- Amber constable, PA-C   Anesthesia-  Spinal EBL-* No blood loss amount entered *  Drains Hemovac  Tourniquet time-  Total Tourniquet Time Documented: Thigh (Left) - 42 minutes Total: Thigh (Left) - 42 minutes    Complications- None  Condition-PACU - hemodynamically stable.   Brief Clinical Note  Joanna Lee is a 63 y.o. year old female with end stage OA of her left knee with progressively worsening pain and dysfunction. She has constant pain, with activity and at rest and significant functional deficits with difficulties even with ADLs. She has had extensive non-op management including analgesics, injections of cortisone and viscosupplements, and home exercise program, but remains in significant pain with significant dysfunction. Radiographs show bone on bone arthritis medial and patellofemoral. She presents now for left Total Knee Arthroplasty.     Procedure in detail---   The patient is brought into the operating room and positioned supine on the operating table. After successful administration of  Spinal,   a tourniquet is placed high on the  Left thigh(s) and the lower extremity is prepped and draped in the usual sterile fashion. Time out is performed by the operating team and then the left lower extremity is wrapped in Esmarch, knee flexed and the tourniquet inflated to 300 mmHg.       A midline incision is made with a ten blade through the subcutaneous tissue to the level of the extensor mechanism. A fresh blade is used to make a medial parapatellar arthrotomy. Soft tissue over the proximal medial tibia is subperiosteally elevated to the joint line with a knife and into the semimembranosus bursa with a Cobb elevator. Soft tissue over the proximal lateral tibia is elevated  with attention being paid to avoiding the patellar tendon on the tibial tubercle. The patella is everted, knee flexed 90 degrees and the ACL and PCL are removed. Findings are bone on bone medial and patellofemoral with large medial and patellar osteophytes.        The drill is used to create a starting hole in the distal femur and the canal is thoroughly irrigated with sterile saline to remove the fatty contents. The 5 degree Left  valgus alignment guide is placed into the femoral canal and the distal femoral cutting block is pinned to remove 10 mm off the distal femur. Resection is made with an oscillating saw.      The tibia is subluxed forward and the menisci are removed. The extramedullary alignment guide is placed referencing proximally at the medial aspect of the tibial tubercle and distally along the second metatarsal axis and tibial crest. The block is pinned to remove 2 mm off the more deficient medial  side. Resection is made with an oscillating saw. Size 4is the most appropriate size for the tibia and the proximal tibia is prepared with the modular drill and keel punch for that size.      The femoral sizing guide is placed and size 5 is most appropriate. Rotation is marked off the epicondylar axis and confirmed by creating a rectangular flexion gap at 90 degrees. The size 5 cutting block is pinned in this rotation and the anterior, posterior and chamfer cuts are made with the oscillating saw. The intercondylar block is then placed and that cut is made.      Trial size  4 tibial component, trial size 5 posterior stabilized femur and a 7  mm posterior stabilized rotating platform insert trial is placed. Full extension is achieved with excellent varus/valgus and anterior/posterior balance throughout full range of motion. The patella is everted and thickness measured to be 22  mm. Free hand resection is taken to 12 mm, a 35 template is placed, lug holes are drilled, trial patella is placed, and it tracks  normally. Osteophytes are removed off the posterior femur with the trial in place. All trials are removed and the cut bone surfaces prepared with pulsatile lavage. Cement is mixed and once ready for implantation, the size 4 tibial implant, size  5 narrow posterior stabilized femoral component, and the size 35 patella are cemented in place and the patella is held with the clamp. The trial insert is placed and the knee held in full extension. The Exparel (20 ml mixed with 30 ml saline) and .25% Bupivicaine, are injected into the extensor mechanism, posterior capsule, medial and lateral gutters and subcutaneous tissues.  All extruded cement is removed and once the cement is hard the permanent 7 mm posterior stabilized rotating platform insert is placed into the tibial tray.      The wound is copiously irrigated with saline solution and the extensor mechanism closed over a hemovac drain with #1 PDS suture. The tourniquet is released for a total tourniquet time of 42  minutes. Flexion against gravity is 140 degrees and the patella tracks normally. Subcutaneous tissue is closed with 2.0 vicryl and subcuticular with running 4.0 Monocryl. The incision is cleaned and dried and steri-strips and a bulky sterile dressing are applied. The limb is placed into a knee immobilizer and the patient is awakened and transported to recovery in stable condition.      Please note that a surgical assistant was a medical necessity for this procedure in order to perform it in a safe and expeditious manner. Surgical assistant was necessary to retract the ligaments and vital neurovascular structures to prevent injury to them and also necessary for proper positioning of the limb to allow for anatomic placement of the prosthesis.   Joanna Plover Omolola Mittman, MD    02/08/2014, 8:14 AM

## 2014-02-08 NOTE — Anesthesia Procedure Notes (Signed)
Spinal  Patient location during procedure: OR Start time: 02/08/2014 7:09 AM End time: 02/08/2014 7:12 AM Staffing Anesthesiologist: Freddie Apley F Performed by: anesthesiologist  Preanesthetic Checklist Completed: patient identified, site marked, surgical consent, pre-op evaluation, timeout performed, IV checked, risks and benefits discussed and monitors and equipment checked Spinal Block Patient position: sitting Prep: Betadine Patient monitoring: heart rate, continuous pulse ox and blood pressure Approach: midline Location: L3-4 Injection technique: single-shot Needle Needle type: Spinocan  Needle gauge: 22 G Needle length: 9 cm Additional Notes Expiration date of kit checked and confirmed. Patient tolerated procedure well, without complications. Negative heme/paresthesia Lot [id number]

## 2014-02-09 DIAGNOSIS — D62 Acute posthemorrhagic anemia: Secondary | ICD-10-CM | POA: Diagnosis not present

## 2014-02-09 LAB — BASIC METABOLIC PANEL
BUN: 11 mg/dL (ref 6–23)
CALCIUM: 9.2 mg/dL (ref 8.4–10.5)
CO2: 24 mEq/L (ref 19–32)
CREATININE: 0.58 mg/dL (ref 0.50–1.10)
Chloride: 104 mEq/L (ref 96–112)
GFR calc non Af Amer: >90 mL/min (ref >90)
Glucose, Bld: 149 mg/dL — ABNORMAL HIGH (ref 70–99)
POTASSIUM: 3.8 meq/L (ref 3.7–5.3)
Sodium: 139 mEq/L (ref 137–147)

## 2014-02-09 LAB — CBC
HEMATOCRIT: 27.9 % — AB (ref 36.0–46.0)
Hemoglobin: 9.5 g/dL — ABNORMAL LOW (ref 12.0–15.0)
MCH: 30.7 pg (ref 26.0–34.0)
MCHC: 34.1 g/dL (ref 30.0–36.0)
MCV: 90.3 fL (ref 78.0–100.0)
Platelets: 235 10*3/uL (ref 150–400)
RBC: 3.09 MIL/uL — ABNORMAL LOW (ref 3.87–5.11)
RDW: 13.4 % (ref 11.5–15.5)
WBC: 9.5 10*3/uL (ref 4.0–10.5)

## 2014-02-09 MED ORDER — HYDROMORPHONE HCL 2 MG PO TABS
2.0000 mg | ORAL_TABLET | ORAL | Status: DC | PRN
  Administered 2014-02-09 – 2014-02-12 (×16): 2 mg via ORAL
  Filled 2014-02-09 (×16): qty 1

## 2014-02-09 MED ORDER — FUROSEMIDE 10 MG/ML IJ SOLN
20.0000 mg | Freq: Once | INTRAMUSCULAR | Status: AC
  Administered 2014-02-09: 20 mg via INTRAVENOUS
  Filled 2014-02-09: qty 2

## 2014-02-09 NOTE — Progress Notes (Signed)
   Subjective: 1 Day Post-Op Procedure(s) (LRB): LEFT TOTAL KNEE ARTHROPLASTY (Left) Patient reports pain as mild.   Patient seen in rounds with Dr. Wynelle Link.  Her biggest complaint is nausea.  Will change medications.  Will look into CIR since she has not coverage for SNF through her insurance. Patient is well, and has had no acute complaints or problems We will start therapy today.  Plan is to go Rehab after hospital stay.  Objective: Vital signs in last 24 hours: Temp:  [97.4 F (36.3 C)-98.5 F (36.9 C)] 98 F (36.7 C) (03/24 0454) Pulse Rate:  [53-71] 71 (03/24 0454) Resp:  [11-16] 16 (03/24 0454) BP: (100-138)/(60-81) 127/73 mmHg (03/24 0454) SpO2:  [96 %-100 %] 97 % (03/24 0454) Weight:  [88.45 kg (195 lb)] 88.45 kg (195 lb) (03/23 1041)  Intake/Output from previous day:  Intake/Output Summary (Last 24 hours) at 02/09/14 0825 Last data filed at 02/09/14 9147  Gross per 24 hour  Intake 2886.25 ml  Output   1206 ml  Net 1680.25 ml    Intake/Output this shift: Total I/O In: -  Out: 20 [Urine:20]  Labs:  Recent Labs  02/09/14 0400  HGB 9.5*    Recent Labs  02/09/14 0400  WBC 9.5  RBC 3.09*  HCT 27.9*  PLT 235    Recent Labs  02/09/14 0400  NA 139  K 3.8  CL 104  CO2 24  BUN 11  CREATININE 0.58  GLUCOSE 149*  CALCIUM 9.2   No results found for this basename: LABPT, INR,  in the last 72 hours  EXAM General - Patient is Alert, Appropriate and Oriented Extremity - Neurovascular intact Sensation intact distally Dorsiflexion/Plantar flexion intact Dressing - dressing C/D/I Motor Function - intact, moving foot and toes well on exam.  Hemovac pulled without difficulty.  Past Medical History  Diagnosis Date  . HTN (hypertension)   . Vitamin D deficiency   . Rheumatoid arthritis(714.0)   . Migraines   . Vertigo   . Osteopenia   . PONV (postoperative nausea and vomiting)     also states "SLOW TO WAKE UP"  . History of blood transfusion   .  Sleep apnea     hasnt  used CPAP in years  . Carotid artery aneurysm 2005    Assessment/Plan: 1 Day Post-Op Procedure(s) (LRB): LEFT TOTAL KNEE ARTHROPLASTY (Left) Principal Problem:   OA (osteoarthritis) of knee Active Problems:   Postoperative anemia due to acute blood loss  Estimated body mass index is 34.55 kg/(m^2) as calculated from the following:   Height as of this encounter: 5\' 3"  (1.6 m).   Weight as of this encounter: 88.45 kg (195 lb). Advance diet Up with therapy Discharge to Rehab  DVT Prophylaxis - Xarelto Weight-Bearing as tolerated to left leg D/C O2 and Pulse OX and try on Room Air  PERKINS, ALEXZANDREW 02/09/2014, 8:25 AM

## 2014-02-09 NOTE — Progress Notes (Signed)
**Note De-Identified Joanna Obfuscation** Physical Therapy Treatment Patient Details Name: Joanna Lee MRN: [id number] DOB: [birth year] Today's Date: 02/09/2014 Time: 9767-3419 PT Time Calculation (min): 34 min  PT Assessment / Plan / Recommendation  History of Present Illness s/p L TKA   PT Comments   Pt moving well,  Limited by nausea and pain.  Follow Up Recommendations  CIR (no SNF benefits)     Does the patient have the potential to tolerate intense rehabilitation     Barriers to Discharge        Equipment Recommendations  Rolling walker with 5" wheels    Recommendations for Other Services Rehab consult  Frequency 7X/week   Progress towards PT Goals Progress towards PT goals: Progressing toward goals  Plan Current plan remains appropriate    Precautions / Restrictions Precautions Precautions: Knee Required Braces or Orthoses: Knee Immobilizer - Left Knee Immobilizer - Left: Discontinue once straight leg raise with < 10 degree lag Restrictions Weight Bearing Restrictions: No Other Position/Activity Restrictions: WBAT   Pertinent Vitals/Pain VSS BP 166/83    Mobility  Bed Mobility Overal bed mobility: Needs Assistance Bed Mobility: Supine to Sit Supine to sit: Min assist General bed mobility comments: cues for technique, self assist; Transfers Overall transfer level: Needs assistance Equipment used: Rolling walker (2 wheeled) Transfers: Sit to/from [facility] Sit to Stand: Min assist Stand pivot transfers: Min assist General transfer comment: cues for hand placement and LLE position Ambulation/Gait Ambulation/Gait assistance: Min assist Ambulation Distance (Feet): 3 Feet Assistive device: Rolling walker (2 wheeled) Gait Pattern/deviations: Step-to pattern General Gait Details: cues for sequence, RW safety    Exercises     PT Diagnosis:    PT Problem List:   PT Treatment Interventions:     PT Goals (current goals can now be found in the care plan section) Acute Rehab PT  Goals Patient Stated Goal: I; pt wants to go to rehab Time For Goal Achievement: 02/15/14 Potential to Achieve Goals: Good  Visit Information  Last PT Received On: 02/09/14 Assistance Needed: +1 History of Present Illness: s/p L TKA    Subjective Data  Patient Stated Goal: I; pt wants to go to rehab   Cognition  Cognition Arousal/Alertness: Awake/alert Behavior During Therapy: WFL for tasks assessed/performed Overall Cognitive Status: Within Functional Limits for tasks assessed    Balance     End of Session PT - End of Session Equipment Utilized During Treatment: Gait belt Activity Tolerance: Patient limited by pain (and nausea) Patient left: in chair;with call bell/phone within reach;with nursing/sitter in room Nurse Communication: Mobility status CPM Left Knee CPM Left Knee: Off   GP     [facility] 02/09/2014, 10:07 AM

## 2014-02-09 NOTE — Evaluation (Signed)
Occupational Therapy Evaluation Patient Details Name: Joanna Lee MRN: [id number] DOB: [birth year] Today's Date: 02/09/2014    History of Present Illness s/p L TKA   Clinical Impression   Pt was admitted for the above surgery.  She will benefit from skilled OT to increase safety and independence with adls.  Pt lives alone and will benefit from further rehab.    Follow Up Recommendations  CIR    Equipment Recommendations  3 in 1 bedside comode    Recommendations for Other Services       Precautions / Restrictions Precautions Required Braces or Orthoses: Knee Immobilizer - Left Knee Immobilizer - Left: Discontinue once straight leg raise with < 10 degree lag Restrictions Other Position/Activity Restrictions: WBAT      Mobility Bed Mobility                  Transfers       Sit to Stand: Min assist Stand pivot transfers: Min assist       General transfer comment: cues for hand placement and LLE position    Balance                                    ADL         Lower Body Bathing: Moderate assistance;Sit to/from stand Lower Body Dressing: Maximal assistance;Sit to/from stand Toilet Transfer: Minimal assistance;Stand-pivot;BSC Toileting- Clothing Manipulation and Hygiene: Minimal assistance;Sit to/from stand     General ADL Comments: Pt needed cues for safety for transfer:  sat quickly due to nausea.  Pt is able to perform UB adls with set up.  Did not introduce AE on this visit.  Pt cannot lift LLE for adls without assistance.  Pain and nausea are limiting pt     Vision                     Perception     Praxis      Pertinent Vitals/Pain 7/10 L knee.  Premedicated; repositioned and ice applied     Hand Dominance Right   Extremity/Trunk Assessment Upper Extremity Assessment Upper Extremity Assessment: Overall WFL for tasks assessed           Communication Communication Communication: No difficulties    Cognition Arousal/Alertness: Awake/alert Behavior During Therapy: WFL for tasks assessed/performed Overall Cognitive Status: Within Functional Limits for tasks assessed                     General Comments       Exercises      Home Living                                   Additional Comments: pt lives alone.  Hopes to go to rehab. has standard toilet and tub/shower      Prior Functioning/Environment Level of Independence: Independent             OT Diagnosis:     OT Problem List: Decreased strength;Decreased activity tolerance;Decreased safety awareness;Decreased knowledge of use of DME or AE;Pain   OT Treatment/Interventions: Self-care/ADL training;DME and/or AE instruction;Patient/family education    OT Goals(Current goals can be found in the care plan section) Acute Rehab OT Goals Patient Stated Goal: I; pt wants to go to rehab OT Goal Formulation: With patient Time For Goal Achievement: 02/16/14 Potential  to Achieve Goals: Good ADL Goals Pt Will Perform Grooming: with supervision;standing Pt Will Perform Lower Body Bathing: with min assist;with adaptive equipment;sit to/from stand Pt Will Transfer to Toilet: with min guard assist;bedside commode;ambulating Pt Will Perform Toileting - Clothing Manipulation and hygiene: with min guard assist;sit to/from stand  OT Frequency: Min 2X/week   Barriers to D/C:            End of Session:    Activity Tolerance: Patient limited by pain Patient left: in chair;with call bell/phone within reach   Time: 1358-1418 OT Time Calculation (min): 20 min Charges:  OT General Charges $OT Visit: 1 Procedure OT Evaluation $Initial OT Evaluation Tier I: 1 Procedure OT Treatments $Self Care/Home Management : 8-22 mins G-Codes:    [facility] [place] 2014/02/17, 2:27 PM   Lesle Chris, OTR/L [phone] February 17, 2014

## 2014-02-09 NOTE — Discharge Instructions (Addendum)
° °Dr. Frank Aluisio °Total Joint Specialist °[facility] °[address] °[place], [place] [address] °[phone] ° °TOTAL KNEE REPLACEMENT POSTOPERATIVE DIRECTIONS ° ° ° °Knee Rehabilitation, Guidelines Following Surgery  °Results after knee surgery are often greatly improved when you follow the exercise, range of motion and muscle strengthening exercises prescribed by your doctor. Safety measures are also important to protect the knee from further injury. Any time any of these exercises cause you to have increased pain or swelling in your knee joint, decrease the amount until you are comfortable again and slowly increase them. If you have problems or questions, call your caregiver or physical therapist for advice.  ° °HOME CARE INSTRUCTIONS  °Remove items at home which could result in a fall. This includes throw rugs or furniture in walking pathways.  °Continue medications as instructed at time of discharge. °You may have some home medications which will be placed on hold until you complete the course of blood thinner medication.  °You may start showering once you are discharged home but do not submerge the incision under water. Just pat the incision dry and apply a dry gauze dressing on daily. °Walk with walker as instructed.  °You may resume a sexual relationship in one month or when given the OK by  your doctor.  °· Use walker as long as suggested by your caregivers. °· Avoid periods of inactivity such as sitting longer than an hour when not asleep. This helps prevent blood clots.  °You may put full weight on your legs and walk as much as is comfortable.  °You may return to work once you are cleared by your doctor.  °Do not drive a car for 6 weeks or until released by you surgeon.  °· Do not drive while taking narcotics.  °Wear the elastic stockings for three weeks following surgery during the day but you may remove then at night. °Make sure you keep all of your appointments after your  operation with all of your doctors and caregivers. You should call the office at the above phone number and make an appointment for approximately two weeks after the date of your surgery. °Change the dressing daily and reapply a dry dressing each time. °Please pick up a stool softener and laxative for home use as long as you are requiring pain medications. °· Continue to use ice on the knee for pain and swelling from surgery. You may notice swelling that will progress down to the foot and ankle.  This is normal after surgery.  Elevate the leg when you are not up walking on it.   °It is important for you to complete the blood thinner medication as prescribed by your doctor. °· Continue to use the breathing machine which will help keep your temperature down.  It is common for your temperature to cycle up and down following surgery, especially at night when you are not up moving around and exerting yourself.  The breathing machine keeps your lungs expanded and your temperature down. ° °RANGE OF MOTION AND STRENGTHENING EXERCISES  °Rehabilitation of the knee is important following a knee injury or an operation. After just a few days of immobilization, the muscles of the thigh which control the knee become weakened and shrink (atrophy). Knee exercises are designed to build up the tone and strength of the thigh muscles and to improve knee motion. Often times heat used for twenty to thirty minutes before working out will loosen up your tissues and help with improving the   range of motion but do not use heat for the first two weeks following surgery. These exercises can be done on a training (exercise) mat, on the floor, on a table or on a bed. Use what ever works the best and is most comfortable for you Knee exercises include:  °Leg Lifts - While your knee is still immobilized in a splint or cast, you can do straight leg raises. Lift the leg to 60 degrees, hold for 3 sec, and slowly lower the leg. Repeat 10-20 times 2-3  times daily. Perform this exercise against resistance later as your knee gets better.  °Quad and Hamstring Sets - Tighten up the muscle on the front of the thigh (Quad) and hold for 5-10 sec. Repeat this 10-20 times hourly. Hamstring sets are done by pushing the foot backward against an object and holding for 5-10 sec. Repeat as with quad sets.  °A rehabilitation program following serious knee injuries can speed recovery and prevent re-injury in the future due to weakened muscles. Contact your doctor or a physical therapist for more information on knee rehabilitation.  ° °SKILLED REHAB INSTRUCTIONS: °If the patient is transferred to a skilled rehab facility following release from the hospital, a list of the current medications will be sent to the facility for the patient to continue.  When discharged from the skilled rehab facility, please have the facility set up the patient's Home Health Physical Therapy prior to being released. Also, the skilled facility will be responsible for providing the patient with their medications at time of release from the facility to include their pain medication, the muscle relaxants, and their blood thinner medication. If the patient is still at the rehab facility at time of the two week follow up appointment, the skilled rehab facility will also need to assist the patient in arranging follow up appointment in our office and any transportation needs. ° °MAKE SURE YOU:  °Understand these instructions.  °Will watch your condition.  °Will get help right away if you are not doing well or get worse.  ° ° °Pick up stool softner and laxative for home. °Do not submerge incision under water. °May shower. °Continue to use ice for pain and swelling from surgery. ° ° °Take Xarelto for two and a half more weeks, then discontinue Xarelto. °Once the patient has completed the Xarelto, they may resume the 81 mg Aspirin. ° °When discharged from the skilled rehab facility, please have the facility set  up the patient's Home Health Physical Therapy prior to being released.  Also provide the patient with their medications at time of release from the facility to include their pain medication, the muscle relaxants, and their blood thinner medication.  If the patient is still at the rehab facility at time of follow up appointment, please also assist the patient in arranging follow up appointment in our office and any transportation needs. ° ° ° ° °Information on my medicine - XARELTO® (Rivaroxaban) ° °This medication education was reviewed with me or my healthcare representative as part of my discharge preparation.  The pharmacist that spoke with me during my hospital stay was:  Absher, Randall K, RPH ° °Why was Xarelto® prescribed for you? °Xarelto® was prescribed for you to reduce the risk of blood clots forming after orthopedic surgery. The medical term for these abnormal blood clots is venous thromboembolism (VTE). ° °What do you need to know about xarelto® ? °Take your Xarelto® ONCE DAILY at the same time every day. °You may take it   either with or without food. ° °If you have difficulty swallowing the tablet whole, you may crush it and mix in applesauce just prior to taking your dose. ° °Take Xarelto® exactly as prescribed by your doctor and DO NOT stop taking Xarelto® without talking to the doctor who prescribed the medication.  Stopping without other VTE prevention medication to take the place of Xarelto® may increase your risk of developing a clot. ° °After discharge, you should have regular check-up appointments with your healthcare provider that is prescribing your Xarelto®.   ° °What do you do if you miss a dose? °If you miss a dose, take it as soon as you remember on the same day then continue your regularly scheduled once daily regimen the next day. Do not take two doses of Xarelto® on the same day.  ° °Important Safety Information °A possible side effect of Xarelto® is bleeding. You should call your  healthcare provider right away if you experience any of the following: °  Bleeding from an injury or your nose that does not stop. °  Unusual colored urine (red or dark brown) or unusual colored stools (red or black). °  Unusual bruising for unknown reasons. °  A serious fall or if you hit your head (even if there is no bleeding). ° °Some medicines may interact with Xarelto® and might increase your risk of bleeding while on Xarelto®. To help avoid this, consult your healthcare provider or pharmacist prior to using any new prescription or non-prescription medications, including herbals, vitamins, non-steroidal anti-inflammatory drugs (NSAIDs) and supplements. ° °This website has more information on Xarelto®: www.xarelto.com. ° ° °

## 2014-02-09 NOTE — Progress Notes (Signed)
   CARE MANAGEMENT NOTE 02/09/2014  Patient:  Joanna Lee, Joanna Lee   Account Number:  [id number]  Date Initiated:  02/09/2014  Documentation initiated by:  [facility]  Subjective/Objective Assessment:   LEFT TOTAL KNEE ARTHROPLASTY     Action/Plan:   [place]   Anticipated DC Date:  02/11/2014   Anticipated DC Plan:  IP REHAB FACILITY      DC Planning Services  CM consult      Choice offered to / List presented to:             Status of service:  Completed, signed off Medicare Important Message given?   (If response is "NO", the following Medicare IM given date fields will be blank) Date Medicare IM given:   Date Additional Medicare IM given:    Discharge Disposition:  IP REHAB FACILITY  Per UR Regulation:    If discussed at Long Length of Stay Meetings, dates discussed:    Comments:  02/09/2014 1220 NCM spoke to pt and states she lives alone. Pt request CIR. States her insurance does not have SNF benefit. Joanna Finner RN CCM Case Mgmt phone [phone]

## 2014-02-09 NOTE — Consult Note (Signed)
Physical Medicine and Rehabilitation Consult Reason for Consult: Left knee OA Referring Physician: Dr. Wynelle Link.    HPI: Joanna Lee is a 63 y.o. female with history of HTN, migraines, morbid obesity, left Knee OA with failure of conservative therapy. Patient elected to undergo L-TKR on 02/08/14 by Dr. Wynelle Link. Post op limited by nausea and pain. Is WBAT and on Xarelto for DVT prophylaxis. Noted to have mild hypokalemia as well as ABLA. She does not have SNF benefits and CIR recommended for follow up therapies.    ROS  Past Medical History  Diagnosis Date  . HTN (hypertension)   . Vitamin D deficiency   . Rheumatoid arthritis(714.0)   . Migraines   . Vertigo   . Osteopenia   . PONV (postoperative nausea and vomiting)     also states "SLOW TO WAKE UP"  . History of blood transfusion   . Sleep apnea     hasnt  used CPAP in years  . Carotid artery aneurysm 2005   Past Surgical History  Procedure Laterality Date  . Shoulder surgery  1996    Left  . Coiling of carotid aneurysm  2005    CAROTID  . Knee surgery  2000/2009    Bilateral/ arthroscopy  . Abdominal hysterectomy    . Total knee arthroplasty Left 02/08/2014    Procedure: LEFT TOTAL KNEE ARTHROPLASTY;  Surgeon: [name], MD;  Location: [facility];  Service: Orthopedics;  Laterality: Left;   Family History  Problem Relation Age of Onset  . Lung cancer Father   . Lung cancer Brother   . Heart attack Brother 73  . Colon cancer Brother     x 2  . Colon cancer Sister   . Colon cancer Sister   . Hypertension Other   . Diabetes Other   . Gout Other   . Multiple sclerosis Sister   . Ovarian cancer Sister   . Breast cancer Mother   . Asthma Brother    Social History:  reports that she has never smoked. She has never used smokeless tobacco. She reports that she does not drink alcohol or use illicit drugs.   Allergies  Allergen Reactions  . Codeine Other (See Comments)    Cant remember/ PASSED OUT per  pt   Medications Prior to Admission  Medication Sig Dispense Refill  . aspirin 81 MG tablet Take 81 mg by mouth daily.       . Multiple Vitamins-Minerals (MULTIVITAMIN WITH MINERALS) tablet Take 2 tablets by mouth daily.       . potassium chloride SA (K-DUR,KLOR-CON) 20 MEQ tablet Take 20 mEq by mouth daily.      . valsartan-hydrochlorothiazide (DIOVAN-HCT) 160-12.5 MG per tablet Take 1 tablet by mouth at bedtime.        Home: Home Living Family/patient expects to be discharged to:: Private residence Living Arrangements: Alone Type of Home: House Home Access: Stairs to enter [employer] of Steps: 4 or 6 at back  Entrance Stairs-Rails: Right;Left;Can reach both Home Layout: One level Home Equipment: None  Functional History: Prior Function Level of Independence: Independent Functional Status:  Mobility: Bed Mobility Overal bed mobility: Needs Assistance Bed Mobility: Supine to Sit Supine to sit: Min assist General bed mobility comments: cues for technique, self assist; Transfers Overall transfer level: Needs assistance Equipment used: Rolling walker (2 wheeled) Transfers: Sit to/from [facility] Sit to Stand: Min assist Stand pivot transfers: Min assist General transfer comment: cues for  hand placement and LLE position Ambulation/Gait Ambulation/Gait assistance: Min assist Ambulation Distance (Feet): 3 Feet Assistive device: Rolling walker (2 wheeled) Gait Pattern/deviations: Step-to pattern General Gait Details: cues for sequence, RW safety    ADL:    Cognition: Cognition Overall Cognitive Status: Within Functional Limits for tasks assessed Orientation Level: Oriented X4 Cognition Arousal/Alertness: Awake/alert Behavior During Therapy: WFL for tasks assessed/performed Overall Cognitive Status: Within Functional Limits for tasks assessed  Blood pressure 155/90, pulse 69, temperature 97.6 F (36.4 C), temperature source Oral, resp.  rate 18, height 5\' 3"  (1.6 m), weight 88.45 kg (195 lb), SpO2 100.00%. Physical Exam  Results for orders placed during the hospital encounter of 02/08/14 (from the past 24 hour(s))  CBC     Status: Abnormal   Collection Time    02/09/14  4:00 AM      Result Value Ref Range   WBC 9.5  4.0 - 10.5 K/uL   RBC 3.09 (*) 3.87 - 5.11 MIL/uL   Hemoglobin 9.5 (*) 12.0 - 15.0 g/dL   HCT 27.9 (*) 36.0 - 46.0 %   MCV 90.3  78.0 - 100.0 fL   MCH 30.7  26.0 - 34.0 pg   MCHC 34.1  30.0 - 36.0 g/dL   RDW 13.4  11.5 - 15.5 %   Platelets 235  150 - 400 K/uL  BASIC METABOLIC PANEL     Status: Abnormal   Collection Time    02/09/14  4:00 AM      Result Value Ref Range   Sodium 139  137 - 147 mEq/L   Potassium 3.8  3.7 - 5.3 mEq/L   Chloride 104  96 - 112 mEq/L   CO2 24  19 - 32 mEq/L   Glucose, Bld 149 (*) 70 - 99 mg/dL   BUN 11  6 - 23 mg/dL   Creatinine, Ser 0.58  0.50 - 1.10 mg/dL   Calcium 9.2  8.4 - 10.5 mg/dL   GFR calc non Af Amer >90  >90 mL/min   GFR calc Af Amer >90  >90 mL/min   No results found.  Assessment/Plan: Diagnosis: endstage OA left knee s/p left TKA 1. Does the need for close, 24 hr/day medical supervision in concert with the patient's rehab needs make it unreasonable for this patient to be served in a less intensive setting? Yes 2. Co-Morbidities requiring supervision/potential complications: htn, RA, abla, pain mgt 3. Due to bladder management, bowel management, safety, skin/wound care, disease management, medication administration, pain management and patient education, does the patient require 24 hr/day rehab nursing? Yes 4. Does the patient require coordinated care of a physician, rehab nurse, PT (1-2 hrs/day, 5 days/week) and OT (1-2 hrs/day, 5 days/week) to address physical and functional deficits in the context of the above medical diagnosis(es)? Yes Addressing deficits in the following areas: balance, endurance, locomotion, strength, transferring, bowel/bladder  control, bathing, dressing, feeding, grooming, toileting and psychosocial support 5. Can the patient actively participate in an intensive therapy program of at least 3 hrs of therapy per day at least 5 days per week? Yes 6. The potential for patient to make measurable gains while on inpatient rehab is excellent 7. Anticipated functional outcomes upon discharge from inpatient rehab are modified independent  with PT, modified independent with OT, n/a with SLP. 8. Estimated rehab length of stay to reach the above functional goals is: 7-10 days 9. Does the patient have adequate social supports to accommodate these discharge functional goals? Yes and Potentially 10. Anticipated D/C  setting: Home 11. Anticipated post D/C treatments: HH therapy and Outpatient therapy 12. Overall Rehab/Functional Prognosis: excellent  RECOMMENDATIONS: This patient's condition is appropriate for continued rehabilitative care in the following setting: CIR Patient has agreed to participate in recommended program. Yes Note that insurance prior authorization may be required for reimbursement for recommended care.  Comment: Rehab Admissions Coordinator to follow up.  Thanks,  Meredith Staggers, MD, Mellody Drown     02/09/2014

## 2014-02-09 NOTE — Progress Notes (Signed)
PA called about blood pressure that has stayed increased this morning and afternoon.   PA wanted to give IV lasix (20 mg, time documented on MAR) to pull fluid and try to help decrease patient's blood pressure.   PA also asked RN to check blood pressure every hour for 4 hours after giving IV lasix.

## 2014-02-10 LAB — BASIC METABOLIC PANEL
BUN: 12 mg/dL (ref 6–23)
CALCIUM: 9.7 mg/dL (ref 8.4–10.5)
CO2: 28 mEq/L (ref 19–32)
Chloride: 100 mEq/L (ref 96–112)
Creatinine, Ser: 0.65 mg/dL (ref 0.50–1.10)
Glucose, Bld: 125 mg/dL — ABNORMAL HIGH (ref 70–99)
Potassium: 3.9 mEq/L (ref 3.7–5.3)
SODIUM: 137 meq/L (ref 137–147)

## 2014-02-10 LAB — CBC
HCT: 29.1 % — ABNORMAL LOW (ref 36.0–46.0)
Hemoglobin: 9.6 g/dL — ABNORMAL LOW (ref 12.0–15.0)
MCH: 29.9 pg (ref 26.0–34.0)
MCHC: 33 g/dL (ref 30.0–36.0)
MCV: 90.7 fL (ref 78.0–100.0)
PLATELETS: 248 10*3/uL (ref 150–400)
RBC: 3.21 MIL/uL — ABNORMAL LOW (ref 3.87–5.11)
RDW: 13.5 % (ref 11.5–15.5)
WBC: 10.1 10*3/uL (ref 4.0–10.5)

## 2014-02-10 MED ORDER — METHOCARBAMOL 500 MG PO TABS: 500.0000 mg | ORAL_TABLET | Freq: Four times a day (QID) | ORAL | Status: DC | PRN

## 2014-02-10 MED ORDER — POLYETHYLENE GLYCOL 3350 17 G PO PACK: 17.0000 g | PACK | Freq: Every day | ORAL | Status: DC | PRN

## 2014-02-10 MED ORDER — TRAMADOL HCL 50 MG PO TABS: 50.0000 mg | ORAL_TABLET | Freq: Four times a day (QID) | ORAL | Status: DC | PRN

## 2014-02-10 MED ORDER — DSS 100 MG PO CAPS: 100.0000 mg | ORAL_CAPSULE | Freq: Two times a day (BID) | ORAL | Status: DC

## 2014-02-10 MED ORDER — ONDANSETRON HCL 4 MG PO TABS: 4.0000 mg | ORAL_TABLET | Freq: Four times a day (QID) | ORAL | Status: DC | PRN

## 2014-02-10 MED ORDER — METOCLOPRAMIDE HCL 5 MG PO TABS: 5.0000 mg | ORAL_TABLET | Freq: Three times a day (TID) | ORAL | Status: DC | PRN

## 2014-02-10 MED ORDER — BISACODYL 10 MG RE SUPP: 10.0000 mg | Freq: Every day | RECTAL | Status: DC | PRN

## 2014-02-10 MED ORDER — HYDROMORPHONE HCL 2 MG PO TABS: 2.0000 mg | ORAL_TABLET | ORAL | Status: DC | PRN

## 2014-02-10 MED ORDER — ACETAMINOPHEN 325 MG PO TABS: 650.0000 mg | ORAL_TABLET | Freq: Four times a day (QID) | ORAL | Status: DC | PRN

## 2014-02-10 MED ORDER — RIVAROXABAN 10 MG PO TABS: 10.0000 mg | ORAL_TABLET | Freq: Every day | ORAL | Status: DC

## 2014-02-10 NOTE — Progress Notes (Signed)
   Subjective: 2 Days Post-Op Procedure(s) (LRB): LEFT TOTAL KNEE ARTHROPLASTY (Left) Patient reports pain as mild.   Patient seen in rounds for Dr. Wynelle Link on morning rounds. Patient is well, but has had some minor complaints of pain in the knee, requiring pain medications Plan is to go Rehab after hospital stay.  Objective: Vital signs in last 24 hours: Temp:  [98 F (36.7 C)-98.7 F (37.1 C)] 98.6 F (37 C) (03/25 2141) Pulse Rate:  [64-99] 99 (03/25 2141) Resp:  [14-16] 16 (03/25 2141) BP: (120-160)/(75-110) 120/75 mmHg (03/25 2141) SpO2:  [95 %-99 %] 95 % (03/25 2141)  Intake/Output from previous day:  Intake/Output Summary (Last 24 hours) at 02/10/14 2210 Last data filed at 02/10/14 2023  Gross per 24 hour  Intake    600 ml  Output   2525 ml  Net  -1925 ml    Intake/Output this shift: Total I/O In: 120 [P.O.:120] Out: 150 [Urine:150]  Labs:  Recent Labs  02/09/14 0400 02/10/14 0354  HGB 9.5* 9.6*    Recent Labs  02/09/14 0400 02/10/14 0354  WBC 9.5 10.1  RBC 3.09* 3.21*  HCT 27.9* 29.1*  PLT 235 248    Recent Labs  02/09/14 0400 02/10/14 0354  NA 139 137  K 3.8 3.9  CL 104 100  CO2 24 28  BUN 11 12  CREATININE 0.58 0.65  GLUCOSE 149* 125*  CALCIUM 9.2 9.7   No results found for this basename: LABPT, INR,  in the last 72 hours  EXAM General - Patient is Alert, Appropriate and Oriented Extremity - Neurovascular intact Sensation intact distally Dressing/Incision - clean, dry, no drainage, healing Motor Function - intact, moving foot and toes well on exam.   Past Medical History  Diagnosis Date  . HTN (hypertension)   . Vitamin D deficiency   . Rheumatoid arthritis(714.0)   . Migraines   . Vertigo   . Osteopenia   . PONV (postoperative nausea and vomiting)     also states "SLOW TO WAKE UP"  . History of blood transfusion   . Sleep apnea     hasnt  used CPAP in years  . Carotid artery aneurysm 2005    Assessment/Plan: 2 Days  Post-Op Procedure(s) (LRB): LEFT TOTAL KNEE ARTHROPLASTY (Left) Principal Problem:   OA (osteoarthritis) of knee Active Problems:   Postoperative anemia due to acute blood loss  Estimated body mass index is 34.55 kg/(m^2) as calculated from the following:   Height as of this encounter: 5\' 3"  (1.6 m).   Weight as of this encounter: 88.45 kg (195 lb). Advance diet Up with therapy Plan for discharge tomorrow Discharge to Rehab depending upon insurance approval.  DVT Prophylaxis - Xarelto Weight-Bearing as tolerated to left leg  PERKINS, ALEXZANDREW 02/10/2014, 10:10 PM

## 2014-02-10 NOTE — Progress Notes (Signed)
Inpatient Rehabilitation  I met the patient at the bedside to discuss her post acute rehab options.  She desires to come to [facility].  I will submit for insurance approval.  I carefully explained to pt. that we will need both insurance authorization and bed availability to be able to admit her.  She voices understanding .  Discussed with RN Al Corpus and with Mingo Amber.  [facility] will follow up as soon as we  hear back from insurance.  In  my absence Thursday and Friday, please contact Danne Baxter at [phone]) if questions arise.  Thanks!  [place] Admissions Coordinator Cell [phone] Office [phone]

## 2014-02-10 NOTE — Discharge Summary (Signed)
Physician Discharge Summary   Patient ID: [name] MRN: [id number] DOB/AGE: May 13, 1951 63 y.o.  Admit date: 02/08/2014 Discharge date: 02-11-2014  Primary Diagnosis:  Osteoarthritis Left knee(s)  Admission Diagnoses:  Past Medical History  Diagnosis Date  . HTN (hypertension)   . Vitamin D deficiency   . Rheumatoid arthritis(714.0)   . Migraines   . Vertigo   . Osteopenia   . PONV (postoperative nausea and vomiting)     also states "SLOW TO WAKE UP"  . History of blood transfusion   . Sleep apnea     hasnt  used CPAP in years  . Carotid artery aneurysm 2005   Discharge Diagnoses:   Principal Problem:   OA (osteoarthritis) of knee Active Problems:   Postoperative anemia due to acute blood loss  Estimated body mass index is 34.55 kg/(m^2) as calculated from the following:   Height as of this encounter: _0  (1.6 m).   Weight as of this encounter: 88.45 kg (195 lb).  Procedure:  Procedure(s) (LRB): LEFT TOTAL KNEE ARTHROPLASTY (Left)   Consults: None  HPI: Joanna Lee is a 63 y.o. year old female with end stage OA of her left knee with progressively worsening pain and dysfunction. She has constant pain, with activity and at rest and significant functional deficits with difficulties even with ADLs. She has had extensive non-op management including analgesics, injections of cortisone and viscosupplements, and home exercise program, but remains in significant pain with significant dysfunction. Radiographs show bone on bone arthritis medial and patellofemoral. She presents now for left Total Knee Arthroplasty.   Laboratory Data: Admission on 02/08/2014  Component Date Value Ref Range Status  . WBC 02/09/2014 9.5  4.0 - 10.5 K/uL Final  . RBC 02/09/2014 3.09* 3.87 - 5.11 MIL/uL Final  . Hemoglobin 02/09/2014 9.5* 12.0 - 15.0 g/dL Final   REPEATED TO VERIFY  . HCT 02/09/2014 27.9* 36.0 - 46.0 % Final  . MCV 02/09/2014 90.3  78.0 - 100.0 fL Final  . MCH 02/09/2014 30.7   26.0 - 34.0 pg Final  . MCHC 02/09/2014 34.1  30.0 - 36.0 g/dL Final  . RDW 02/09/2014 13.4  11.5 - 15.5 % Final  . Platelets 02/09/2014 235  150 - 400 K/uL Final  . Sodium 02/09/2014 139  137 - 147 mEq/L Final  . Potassium 02/09/2014 3.8  3.7 - 5.3 mEq/L Final  . Chloride 02/09/2014 104  96 - 112 mEq/L Final  . CO2 02/09/2014 24  19 - 32 mEq/L Final  . Glucose, Bld 02/09/2014 149* 70 - 99 mg/dL Final  . BUN 02/09/2014 11  6 - 23 mg/dL Final  . Creatinine, Ser 02/09/2014 0.58  0.50 - 1.10 mg/dL Final  . Calcium 02/09/2014 9.2  8.4 - 10.5 mg/dL Final  . GFR calc non Af Amer 02/09/2014 >90  >90 mL/min Final  . GFR calc Af Amer 02/09/2014 >90  >90 mL/min Final   Comment: (NOTE)                          The eGFR has been calculated using the CKD EPI equation.                          This calculation has not been validated in all clinical situations.  eGFR's persistently <90 mL/min signify possible Chronic Kidney                          Disease.  . WBC 02/10/2014 10.1  4.0 - 10.5 K/uL Final  . RBC 02/10/2014 3.21* 3.87 - 5.11 MIL/uL Final  . Hemoglobin 02/10/2014 9.6* 12.0 - 15.0 g/dL Final  . HCT 02/10/2014 29.1* 36.0 - 46.0 % Final  . MCV 02/10/2014 90.7  78.0 - 100.0 fL Final  . MCH 02/10/2014 29.9  26.0 - 34.0 pg Final  . MCHC 02/10/2014 33.0  30.0 - 36.0 g/dL Final  . RDW 02/10/2014 13.5  11.5 - 15.5 % Final  . Platelets 02/10/2014 248  150 - 400 K/uL Final  . Sodium 02/10/2014 137  137 - 147 mEq/L Final  . Potassium 02/10/2014 3.9  3.7 - 5.3 mEq/L Final  . Chloride 02/10/2014 100  96 - 112 mEq/L Final  . CO2 02/10/2014 28  19 - 32 mEq/L Final  . Glucose, Bld 02/10/2014 125* 70 - 99 mg/dL Final  . BUN 02/10/2014 12  6 - 23 mg/dL Final  . Creatinine, Ser 02/10/2014 0.65  0.50 - 1.10 mg/dL Final  . Calcium 02/10/2014 9.7  8.4 - 10.5 mg/dL Final  . GFR calc non Af Amer 02/10/2014 >90  >90 mL/min Final  . GFR calc Af Amer 02/10/2014 >90  >90 mL/min Final    Comment: (NOTE)                          The eGFR has been calculated using the CKD EPI equation.                          This calculation has not been validated in all clinical situations.                          eGFR's persistently <90 mL/min signify possible Chronic Kidney                          Disease.  Hospital Outpatient Visit on 02/02/2014  Component Date Value Ref Range Status  . aPTT 02/02/2014 30  24 - 37 seconds Final  . WBC 02/02/2014 6.7  4.0 - 10.5 K/uL Final  . RBC 02/02/2014 4.17  3.87 - 5.11 MIL/uL Final  . Hemoglobin 02/02/2014 12.7  12.0 - 15.0 g/dL Final  . HCT 02/02/2014 37.5  36.0 - 46.0 % Final  . MCV 02/02/2014 89.9  78.0 - 100.0 fL Final  . MCH 02/02/2014 30.5  26.0 - 34.0 pg Final  . MCHC 02/02/2014 33.9  30.0 - 36.0 g/dL Final  . RDW 02/02/2014 13.3  11.5 - 15.5 % Final  . Platelets 02/02/2014 290  150 - 400 K/uL Final  . Sodium 02/02/2014 140  137 - 147 mEq/L Final  . Potassium 02/02/2014 3.5* 3.7 - 5.3 mEq/L Final  . Chloride 02/02/2014 100  96 - 112 mEq/L Final  . CO2 02/02/2014 29  19 - 32 mEq/L Final  . Glucose, Bld 02/02/2014 116* 70 - 99 mg/dL Final  . BUN 02/02/2014 11  6 - 23 mg/dL Final  . Creatinine, Ser 02/02/2014 0.66  0.50 - 1.10 mg/dL Final  . Calcium 02/02/2014 10.6* 8.4 - 10.5 mg/dL Final  . Total Protein 02/02/2014 7.6  6.0 - 8.3 g/dL Final  .  Albumin 02/02/2014 3.6  3.5 - 5.2 g/dL Final  . AST 02/02/2014 17  0 - 37 U/L Final  . ALT 02/02/2014 18  0 - 35 U/L Final  . Alkaline Phosphatase 02/02/2014 84  39 - 117 U/L Final  . Total Bilirubin 02/02/2014 0.3  0.3 - 1.2 mg/dL Final  . GFR calc non Af Amer 02/02/2014 >90  >90 mL/min Final  . GFR calc Af Amer 02/02/2014 >90  >90 mL/min Final   Comment: (NOTE)                          The eGFR has been calculated using the CKD EPI equation.                          This calculation has not been validated in all clinical situations.                          eGFR's persistently <90 mL/min  signify possible Chronic Kidney                          Disease.  Marland Kitchen Prothrombin Time 02/02/2014 12.6  11.6 - 15.2 seconds Final  . INR 02/02/2014 0.96  0.00 - 1.49 Final  . ABO/RH(D) 02/02/2014 A POS   Final  . Antibody Screen 02/02/2014 NEG   Final  . Sample Expiration 02/02/2014 02/11/2014   Final  . Color, Urine 02/02/2014 YELLOW  YELLOW Final  . APPearance 02/02/2014 CLEAR  CLEAR Final  . Specific Gravity, Urine 02/02/2014 1.025  1.005 - 1.030 Final  . pH 02/02/2014 5.0  5.0 - 8.0 Final  . Glucose, UA 02/02/2014 NEGATIVE  NEGATIVE mg/dL Final  . Hgb urine dipstick 02/02/2014 NEGATIVE  NEGATIVE Final  . Bilirubin Urine 02/02/2014 NEGATIVE  NEGATIVE Final  . Ketones, ur 02/02/2014 NEGATIVE  NEGATIVE mg/dL Final  . Protein, ur 02/02/2014 NEGATIVE  NEGATIVE mg/dL Final  . Urobilinogen, UA 02/02/2014 0.2  0.0 - 1.0 mg/dL Final  . Nitrite 02/02/2014 NEGATIVE  NEGATIVE Final  . Leukocytes, UA 02/02/2014 NEGATIVE  NEGATIVE Final   MICROSCOPIC NOT DONE ON URINES WITH NEGATIVE PROTEIN, BLOOD, LEUKOCYTES, NITRITE, OR GLUCOSE <1000 mg/dL.  Marland Kitchen MRSA, PCR 02/02/2014 NEGATIVE  NEGATIVE Final  . Staphylococcus aureus 02/02/2014 NEGATIVE  NEGATIVE Final   Comment:                                 The Xpert SA Assay (FDA                          approved for NASAL specimens                          in patients over 96 years of age),                          is one component of                          a comprehensive surveillance                          program.  Test performance has                          been validated by [facility] for patients greater                          than or equal to 16 year old.                          It is not intended                          to diagnose infection nor to                          guide or monitor treatment.  . ABO/RH(D) 02/02/2014 A POS   Final     X-Rays:Mr Mra Head Wo Contrast  02/04/2014   CLINICAL DATA:   Followup left cervical ICA aneurysm, status post endovascular coil embolization.  EXAM: MRI HEAD WITH CONTRAST AND MRA HEAD WITH CONTRAST AND MRI NECK WITHOUT AND WITH CONTRAST  TECHNIQUE: Multiplanar, multiecho pulse sequences of the brain and surrounding structures were obtained with intravenous contrast. Angiographic images of the head were obtained using MRA technique with contrast. Multiplanar, multiecho pulse sequences of the neck and surrounding structures were obtained without and with intravenous contrast.  CONTRAST:  28m MULTIHANCE GADOBENATE DIMEGLUMINE 529 MG/ML IV SOLN  COMPARISON:  Prior MRI/ MRA and from 12/13/2012.  FINDINGS: MRI HEAD FINDINGS  The CSF containing spaces are within normal limits for patient age. Few scattered T2/FLAIR hyperintensities within the periventricular and deep white matter are stable as compared to prior exam, and likely related to mild chronic microvascular ischemic changes. No mass lesion, midline shift, or extra-axial fluid collection. Ventricles are normal in size without evidence of hydrocephalus. No abnormal enhancement.  No diffusion-weighted signal abnormality is identified to suggest acute intracranial infarct. Gray-white matter differentiation is maintained. Normal flow voids are seen within the intracranial vasculature. No intracranial hemorrhage identified.  The cervicomedullary junction is normal. Pituitary gland is within normal limits. Pituitary stalk is midline. The globes and optic nerves demonstrate a normal appearance with normal signal intensity. The  The bone marrow signal intensity is normal. Calvarium is intact. Visualized upper cervical spine is within normal limits.  Scalp soft tissues are unremarkable.  Paranasal sinuses are clear.  No mastoid effusion.  MRA HEAD FINDINGS  The visualized portions of the distal cervical internal carotid arteries are widely patent with antegrade flow. The petrous, cavernous, and supra clinoid segments are patent  bilaterally. Mild irregularity and dolichoectasia of the cavernous segment of the right ICA and is stable. Ophthalmic artery origins remain normal. Carotid termini are patent. MCA and ACA origins are stable.  Mild irregularity in of the left A1 segment is noted. The MCA arteries are stable bilaterally without proximal branch occlusion, high-grade stenosis, or aneurysm. MCA bifurcations are normal. Distal MCA branches are symmetric.  The vertebral arteries are widely patent with antegrade flow. The right vertebral artery is slightly diminutive. The posterior inferior cerebral arteries are normal. Vertebrobasilar junction and basilar artery are widely patent with antegrade flow without evidence of basilar tip stenosis or aneurysm. Posterior  cerebral arteries are normal bilaterally. The superior cerebellar arteries and anterior inferior cerebellar arteries are widely patent bilaterally. No intracranial aneurysm within the posterior circulation.  MRI NECK FINDINGS  The time-of-flight images demonstrate normal appearance of the aortic arch with normal 3 vessel morphology. Widely patent antegrade flow present within both common carotid arteries which are mildly tortuous. Vertebral arteries are widely patent with antegrade flow. Vascular tortuosity again noted.  Postcontrast MRA images demonstrate no significant stenosis at the origin of the great vessels.  Tortuosity of the bilateral vertebral arteries with occasional kinking, left greater than right, is similar. Vertebral arteries are codominant within the neck.  The right carotid bifurcation and right internal carotid artery are widely patent. Severe tortuosity of the proximal cervical right ICA again noted.  Susceptibility artifact associated with previously treated coiled segment of the left internal carotid artery. Stable small focus of enhancement at the proximal aspect of the coiled segment. No new aneurysmal growth. Distal left ICA is within normal limits. The  left carotid bifurcation is widely patent.  IMPRESSION: MRI BRAIN:  1. Stable and negative for age MRI appearance of the brain.  MRA BRAIN:  1. Stable MRA of the brain. No intracranial stenosis, intracranial aneurysm, or proximal branch occlusion identified.  MRA NECK:  1. Stable MRI appearance of the neck with minimal neck remnant at the previously treated cervical left ICA coiled pseudoaneurysm. 2. Diffuse vessel tortuosity in the neck with no high-grade stenosis identified.   Electronically Signed   By: Jeannine Boga M.D.   On: 02/04/2014 22:08   Mr Angiogram Neck W Wo Contrast  02/04/2014   CLINICAL DATA:  Followup left cervical ICA aneurysm, status post endovascular coil embolization.  EXAM: MRI HEAD WITH CONTRAST AND MRA HEAD WITH CONTRAST AND MRI NECK WITHOUT AND WITH CONTRAST  TECHNIQUE: Multiplanar, multiecho pulse sequences of the brain and surrounding structures were obtained with intravenous contrast. Angiographic images of the head were obtained using MRA technique with contrast. Multiplanar, multiecho pulse sequences of the neck and surrounding structures were obtained without and with intravenous contrast.  CONTRAST:  48m MULTIHANCE GADOBENATE DIMEGLUMINE 529 MG/ML IV SOLN  COMPARISON:  Prior MRI/ MRA and from 12/13/2012.  FINDINGS: MRI HEAD FINDINGS  The CSF containing spaces are within normal limits for patient age. Few scattered T2/FLAIR hyperintensities within the periventricular and deep white matter are stable as compared to prior exam, and likely related to mild chronic microvascular ischemic changes. No mass lesion, midline shift, or extra-axial fluid collection. Ventricles are normal in size without evidence of hydrocephalus. No abnormal enhancement.  No diffusion-weighted signal abnormality is identified to suggest acute intracranial infarct. Gray-white matter differentiation is maintained. Normal flow voids are seen within the intracranial vasculature. No intracranial hemorrhage  identified.  The cervicomedullary junction is normal. Pituitary gland is within normal limits. Pituitary stalk is midline. The globes and optic nerves demonstrate a normal appearance with normal signal intensity. The  The bone marrow signal intensity is normal. Calvarium is intact. Visualized upper cervical spine is within normal limits.  Scalp soft tissues are unremarkable.  Paranasal sinuses are clear.  No mastoid effusion.  MRA HEAD FINDINGS  The visualized portions of the distal cervical internal carotid arteries are widely patent with antegrade flow. The petrous, cavernous, and supra clinoid segments are patent bilaterally. Mild irregularity and dolichoectasia of the cavernous segment of the right ICA and is stable. Ophthalmic artery origins remain normal. Carotid termini are patent. MCA and ACA origins are stable.  Mild irregularity  in of the left A1 segment is noted. The MCA arteries are stable bilaterally without proximal branch occlusion, high-grade stenosis, or aneurysm. MCA bifurcations are normal. Distal MCA branches are symmetric.  The vertebral arteries are widely patent with antegrade flow. The right vertebral artery is slightly diminutive. The posterior inferior cerebral arteries are normal. Vertebrobasilar junction and basilar artery are widely patent with antegrade flow without evidence of basilar tip stenosis or aneurysm. Posterior cerebral arteries are normal bilaterally. The superior cerebellar arteries and anterior inferior cerebellar arteries are widely patent bilaterally. No intracranial aneurysm within the posterior circulation.  MRI NECK FINDINGS  The time-of-flight images demonstrate normal appearance of the aortic arch with normal 3 vessel morphology. Widely patent antegrade flow present within both common carotid arteries which are mildly tortuous. Vertebral arteries are widely patent with antegrade flow. Vascular tortuosity again noted.  Postcontrast MRA images demonstrate no  significant stenosis at the origin of the great vessels.  Tortuosity of the bilateral vertebral arteries with occasional kinking, left greater than right, is similar. Vertebral arteries are codominant within the neck.  The right carotid bifurcation and right internal carotid artery are widely patent. Severe tortuosity of the proximal cervical right ICA again noted.  Susceptibility artifact associated with previously treated coiled segment of the left internal carotid artery. Stable small focus of enhancement at the proximal aspect of the coiled segment. No new aneurysmal growth. Distal left ICA is within normal limits. The left carotid bifurcation is widely patent.  IMPRESSION: MRI BRAIN:  1. Stable and negative for age MRI appearance of the brain.  MRA BRAIN:  1. Stable MRA of the brain. No intracranial stenosis, intracranial aneurysm, or proximal branch occlusion identified.  MRA NECK:  1. Stable MRI appearance of the neck with minimal neck remnant at the previously treated cervical left ICA coiled pseudoaneurysm. 2. Diffuse vessel tortuosity in the neck with no high-grade stenosis identified.   Electronically Signed   By: Jeannine Boga M.D.   On: 02/04/2014 22:08   Mr Jeri Cos [facility] Contrast  02/04/2014   CLINICAL DATA:  Followup left cervical ICA aneurysm, status post endovascular coil embolization.  EXAM: MRI HEAD WITH CONTRAST AND MRA HEAD WITH CONTRAST AND MRI NECK WITHOUT AND WITH CONTRAST  TECHNIQUE: Multiplanar, multiecho pulse sequences of the brain and surrounding structures were obtained with intravenous contrast. Angiographic images of the head were obtained using MRA technique with contrast. Multiplanar, multiecho pulse sequences of the neck and surrounding structures were obtained without and with intravenous contrast.  CONTRAST:  43m MULTIHANCE GADOBENATE DIMEGLUMINE 529 MG/ML IV SOLN  COMPARISON:  Prior MRI/ MRA and from 12/13/2012.  FINDINGS: MRI HEAD FINDINGS  The CSF containing spaces are  within normal limits for patient age. Few scattered T2/FLAIR hyperintensities within the periventricular and deep white matter are stable as compared to prior exam, and likely related to mild chronic microvascular ischemic changes. No mass lesion, midline shift, or extra-axial fluid collection. Ventricles are normal in size without evidence of hydrocephalus. No abnormal enhancement.  No diffusion-weighted signal abnormality is identified to suggest acute intracranial infarct. Gray-white matter differentiation is maintained. Normal flow voids are seen within the intracranial vasculature. No intracranial hemorrhage identified.  The cervicomedullary junction is normal. Pituitary gland is within normal limits. Pituitary stalk is midline. The globes and optic nerves demonstrate a normal appearance with normal signal intensity. The  The bone marrow signal intensity is normal. Calvarium is intact. Visualized upper cervical spine is within normal limits.  Scalp soft tissues are  unremarkable.  Paranasal sinuses are clear.  No mastoid effusion.  MRA HEAD FINDINGS  The visualized portions of the distal cervical internal carotid arteries are widely patent with antegrade flow. The petrous, cavernous, and supra clinoid segments are patent bilaterally. Mild irregularity and dolichoectasia of the cavernous segment of the right ICA and is stable. Ophthalmic artery origins remain normal. Carotid termini are patent. MCA and ACA origins are stable.  Mild irregularity in of the left A1 segment is noted. The MCA arteries are stable bilaterally without proximal branch occlusion, high-grade stenosis, or aneurysm. MCA bifurcations are normal. Distal MCA branches are symmetric.  The vertebral arteries are widely patent with antegrade flow. The right vertebral artery is slightly diminutive. The posterior inferior cerebral arteries are normal. Vertebrobasilar junction and basilar artery are widely patent with antegrade flow without evidence of  basilar tip stenosis or aneurysm. Posterior cerebral arteries are normal bilaterally. The superior cerebellar arteries and anterior inferior cerebellar arteries are widely patent bilaterally. No intracranial aneurysm within the posterior circulation.  MRI NECK FINDINGS  The time-of-flight images demonstrate normal appearance of the aortic arch with normal 3 vessel morphology. Widely patent antegrade flow present within both common carotid arteries which are mildly tortuous. Vertebral arteries are widely patent with antegrade flow. Vascular tortuosity again noted.  Postcontrast MRA images demonstrate no significant stenosis at the origin of the great vessels.  Tortuosity of the bilateral vertebral arteries with occasional kinking, left greater than right, is similar. Vertebral arteries are codominant within the neck.  The right carotid bifurcation and right internal carotid artery are widely patent. Severe tortuosity of the proximal cervical right ICA again noted.  Susceptibility artifact associated with previously treated coiled segment of the left internal carotid artery. Stable small focus of enhancement at the proximal aspect of the coiled segment. No new aneurysmal growth. Distal left ICA is within normal limits. The left carotid bifurcation is widely patent.  IMPRESSION: MRI BRAIN:  1. Stable and negative for age MRI appearance of the brain.  MRA BRAIN:  1. Stable MRA of the brain. No intracranial stenosis, intracranial aneurysm, or proximal branch occlusion identified.  MRA NECK:  1. Stable MRI appearance of the neck with minimal neck remnant at the previously treated cervical left ICA coiled pseudoaneurysm. 2. Diffuse vessel tortuosity in the neck with no high-grade stenosis identified.   Electronically Signed   By: Jeannine Boga M.D.   On: 02/04/2014 22:08    EKG: Orders placed in visit on 02/02/14  . EKG 12-LEAD     Hospital Course: ALANIE SYLER is a 63 y.o. who was admitted to [facility]. They were brought to the operating room on 02/08/2014 and underwent Procedure(s): LEFT TOTAL KNEE ARTHROPLASTY.  Patient tolerated the procedure well and was later transferred to the recovery room and then to the orthopaedic floor for postoperative care.  They were given PO and IV analgesics for pain control following their surgery.  They were given 24 hours of postoperative antibiotics of  Anti-infectives   Start     Dose/Rate Route Frequency Ordered Stop   02/08/14 1400  ceFAZolin (ANCEF) IVPB 2 g/50 mL premix     2 g 100 mL/hr over 30 Minutes Intravenous Every 6 hours 02/08/14 1057 02/08/14 2113   02/08/14 0508  ceFAZolin (ANCEF) IVPB 2 g/50 mL premix     2 g 100 mL/hr over 30 Minutes Intravenous On call to O.R. 02/08/14 2831 02/08/14 0716     and started on DVT prophylaxis  in the form of Xarelto.   PT and OT were ordered for total joint protocol.  Discharge planning consulted to help with postop disposition and equipment needs. Consulted CIR to look into CIR since she has no coverage for SNF through her insurance.  Patient had a tough night on the evening of surgery mainly due to nausea.  They started to get up OOB with therapy on day one. Hemovac drain was pulled without difficulty.  Continued to work with therapy into day two.  Dressing was changed on day two and the incision was healing well.  By day three, the patient had progressed with therapy and meeting their goals.  Incision was healing well.  Patient was seen in rounds and was ready to go the rehab facility for continued inpatient therapy and care.  Waiting on final insurance approval at time of summary.   Discharge Medications: Prior to Admission medications   Medication Sig Start Date End Date Taking? Authorizing Provider  acetaminophen (TYLENOL) 325 MG tablet Take 2 tablets (650 mg total) by mouth every 6 (six) hours as needed for mild pain (or Fever >/= 101). 02/10/14   Alexzandrew Perkins, PA-C  bisacodyl (DULCOLAX) 10 MG  suppository Place 1 suppository (10 mg total) rectally daily as needed for moderate constipation. 02/10/14   Alexzandrew Perkins, PA-C  docusate sodium 100 MG CAPS Take 100 mg by mouth 2 (two) times daily. 02/10/14   Alexzandrew Perkins, PA-C  HYDROmorphone (DILAUDID) 2 MG tablet Take 1-2 tablets (2-4 mg total) by mouth every 3 (three) hours as needed for moderate pain or severe pain. 02/10/14   Alexzandrew Dara Lords, PA-C  methocarbamol (ROBAXIN) 500 MG tablet Take 1 tablet (500 mg total) by mouth every 6 (six) hours as needed for muscle spasms. 02/10/14   Alexzandrew Dara Lords, PA-C  metoCLOPramide (REGLAN) 5 MG tablet Take 1-2 tablets (5-10 mg total) by mouth every 8 (eight) hours as needed for nausea (if ondansetron (ZOFRAN) ineffective.). 02/10/14   Alexzandrew Perkins, PA-C  ondansetron (ZOFRAN) 4 MG tablet Take 1 tablet (4 mg total) by mouth every 6 (six) hours as needed for nausea. 02/10/14   Alexzandrew Perkins, PA-C  polyethylene glycol (MIRALAX / GLYCOLAX) packet Take 17 g by mouth daily as needed for mild constipation. 02/10/14   Alexzandrew Perkins, PA-C  potassium chloride SA (K-DUR,KLOR-CON) 20 MEQ tablet Take 20 mEq by mouth daily.    Historical Provider, MD  rivaroxaban (XARELTO) 10 MG TABS tablet Take 1 tablet (10 mg total) by mouth daily with breakfast. Take Xarelto for two and a half more weeks, then discontinue Xarelto. Once the patient has completed the Xarelto, they may resume the 81 mg Aspirin. 02/10/14   Alexzandrew Perkins, PA-C  traMADol (ULTRAM) 50 MG tablet Take 1-2 tablets (50-100 mg total) by mouth every 6 (six) hours as needed (mild to moderate pain). 02/10/14   Alexzandrew Perkins, PA-C  valsartan-hydrochlorothiazide (DIOVAN-HCT) 160-12.5 MG per tablet Take 1 tablet by mouth at bedtime.    Historical Provider, MD    Diet: Cardiac diet Activity:WBAT Follow-up:in 2 weeks Disposition - Rehab Discharged Condition: good   Discharge Orders   Future Orders Complete By Expires    Call MD / Call 911  As directed    Comments:     If you experience chest pain or shortness of breath, CALL 911 and be transported to the hospital emergency room.  If you develope a fever above 101 F, pus (white drainage) or increased drainage or redness at the wound, or calf pain,  call your surgeon's office.   Change dressing  As directed    Comments:     Change dressing daily with sterile 4 x 4 inch gauze dressing and apply TED hose. Do not submerge the incision under water.   Constipation Prevention  As directed    Comments:     Drink plenty of fluids.  Prune juice may be helpful.  You may use a stool softener, such as Colace (over the counter) 100 mg twice a day.  Use MiraLax (over the counter) for constipation as needed.   Diet general  As directed    Discharge instructions  As directed    Comments:     Pick up stool softner and laxative for home. Do not submerge incision under water. May shower. Continue to use ice for pain and swelling from surgery.  Take Xarelto for two and a half more weeks, then discontinue Xarelto. Once the patient has completed the Xarelto, they may resume the 81 mg Aspirin.  When discharged from the skilled rehab facility, please have the facility set up the patient's [place] prior to being released.  Also provide the patient with their medications at time of release from the facility to include their pain medication, the muscle relaxants, and their blood thinner medication.  If the patient is still at the rehab facility at time of follow up appointment, please also assist the patient in arranging follow up appointment in our office and any transportation needs.   Do not put a pillow under the knee. Place it under the heel.  As directed    Do not sit on low chairs, stoools or toilet seats, as it may be difficult to get up from low surfaces  As directed    Driving restrictions  As directed    Comments:     No driving until released by the  physician.   Increase activity slowly as tolerated  As directed    Lifting restrictions  As directed    Comments:     No lifting until released by the physician.   Patient may shower  As directed    Comments:     You may shower without a dressing once there is no drainage.  Do not wash over the wound.  If drainage remains, do not shower until drainage stops.   TED hose  As directed    Comments:     Use stockings (TED hose) for 3 weeks on both leg(s).  You may remove them at night for sleeping.   Weight bearing as tolerated  As directed    Questions:     Laterality:     Extremity:         Medication List    STOP taking these medications       aspirin 81 MG tablet     multivitamin with minerals tablet      TAKE these medications       acetaminophen 325 MG tablet  Commonly known as:  TYLENOL  Take 2 tablets (650 mg total) by mouth every 6 (six) hours as needed for mild pain (or Fever >/= 101).     bisacodyl 10 MG suppository  Commonly known as:  DULCOLAX  Place 1 suppository (10 mg total) rectally daily as needed for moderate constipation.     DSS 100 MG Caps  Take 100 mg by mouth 2 (two) times daily.     HYDROmorphone 2 MG tablet  Commonly known as:  DILAUDID  Take 1-2 tablets (2-4 mg total) by mouth every 3 (three) hours as needed for moderate pain or severe pain.     methocarbamol 500 MG tablet  Commonly known as:  ROBAXIN  Take 1 tablet (500 mg total) by mouth every 6 (six) hours as needed for muscle spasms.     metoCLOPramide 5 MG tablet  Commonly known as:  REGLAN  Take 1-2 tablets (5-10 mg total) by mouth every 8 (eight) hours as needed for nausea (if ondansetron (ZOFRAN) ineffective.).     ondansetron 4 MG tablet  Commonly known as:  ZOFRAN  Take 1 tablet (4 mg total) by mouth every 6 (six) hours as needed for nausea.     polyethylene glycol packet  Commonly known as:  MIRALAX / GLYCOLAX  Take 17 g by mouth daily as needed for mild constipation.      potassium chloride SA 20 MEQ tablet  Commonly known as:  K-DUR,KLOR-CON  Take 20 mEq by mouth daily.     rivaroxaban 10 MG Tabs tablet  Commonly known as:  XARELTO  - Take 1 tablet (10 mg total) by mouth daily with breakfast. Take Xarelto for two and a half more weeks, then discontinue Xarelto.  - Once the patient has completed the Xarelto, they may resume the 81 mg Aspirin.     traMADol 50 MG tablet  Commonly known as:  ULTRAM  Take 1-2 tablets (50-100 mg total) by mouth every 6 (six) hours as needed (mild to moderate pain).     valsartan-hydrochlorothiazide 160-12.5 MG per tablet  Commonly known as:  DIOVAN-HCT  Take 1 tablet by mouth at bedtime.           Follow-up Information   Follow up with [name], MD. Schedule an appointment as soon as possible for a visit on 02/23/2014.   Specialty:  Orthopedic Surgery   Contact information:   [address] [place] [address] [phone]       Signed: Mickel Crow 02/10/2014, 10:42 PM

## 2014-02-10 NOTE — Progress Notes (Signed)
Occupational Therapy Treatment Patient Details Name: Joanna Lee MRN: [id number] DOB: 12-Dec-1950 Today's Date: 02/10/2014    History of present illness s/p L TKA   OT comments  Educated on use of reacher during adls.  Needs reinforcement  Follow Up Recommendations  CIR    Equipment Recommendations  3 in 1 bedside comode    Recommendations for Other Services      Precautions / Restrictions Precautions Precautions: Knee Required Braces or Orthoses: Knee Immobilizer - Left Knee Immobilizer - Left: Discontinue once straight leg raise with < 10 degree lag Restrictions Weight Bearing Restrictions: No       Mobility Bed Mobility                  Transfers       Sit to Stand: Min assist Stand pivot transfers: Min assist       General transfer comment: cues for hand placement and LLE position    Balance                                   ADL         Lower Body Bathing: Minimal assistance;Sit to/from stand;With adaptive equipment Lower Body Dressing: Moderate assistance;Sit to/from stand;With adaptive equipment (simulated pants with reacher)         General ADL Comments: educated on reacher uses and pt used reacher from sitting.  educated on crossing RLE under L to start pants:  pt needs assistance to get into this position. Stood with min guard for ADLs. Pt c/o lightheadedness--BP wfls.  student nurse present and informed RN      Vision                     Perception     Praxis      Cognition   Behavior During Therapy: WFL for tasks assessed/performed Overall Cognitive Status: Within Functional Limits for tasks assessed                       Extremity/Trunk Assessment               Exercises       General Comments      Pertinent Vitals/ Pain       No c/o pain.  Pt lightheaded and BP wfls--student nurse informed RN  Home Living                                          Prior  Functioning/Environment              Frequency Min 2X/week     Progress Toward Goals  OT Goals(current goals can now be found in the care plan section)  Progress towards OT goals: Progressing toward goals     Plan      End of Session CPM Left Knee CPM Left Knee: Off  Activity Tolerance  (limited by lightheadedness)   Patient Left in chair;with call bell/phone within reach   Nurse Communication          Time: 8466-5993 OT Time Calculation (min): 28 min  Charges: OT General Charges $OT Visit: 1 Procedure OT Treatments $Self Care/Home Management : 23-37 mins  Cris Gibby 02/10/2014, 9:52 AM  Lesle Chris, OTR/L [phone] 02/10/2014

## 2014-02-10 NOTE — Progress Notes (Signed)
Physical Therapy Treatment Patient Details Name: Joanna Lee MRN: [id number] DOB: August 13, 1951 Today's Date: 02/10/2014    History of Present Illness s/p L TKA    PT Comments    POD # 2 pt progressing slowly.  Assisted out of recliner to amb to BR.  Assisted with hygiene as pt had difficulty due to balance.  Assisted out of BR to amb limited distance in hallway.  Pt cont to demon hypertension 150/86 RN notified. Pt will need need Inpt Rehab prior to going home.   Follow Up Recommendations  CIR     Equipment Recommendations  Rolling walker with 5" wheels    Recommendations for Other Services       Precautions / Restrictions Precautions Precautions: Knee Precaution Comments: instructed pt on KI use for amb Required Braces or Orthoses: Knee Immobilizer - Left Knee Immobilizer - Left: Discontinue once straight leg raise with < 10 degree lag Restrictions Weight Bearing Restrictions: No Other Position/Activity Restrictions: WBAT    Mobility  Bed Mobility               General bed mobility comments: Pt OOB in recliner  Transfers Overall transfer level: Needs assistance Equipment used: Rolling walker (2 wheeled) Transfers: Sit to/from Stand Sit to Stand: Mod assist         General transfer comment: cues for hand placement and LLE position and increased assist off commode  Ambulation/Gait Ambulation/Gait assistance: Min assist Ambulation Distance (Feet): 42 Feet (12, 12, 18 with sitting rest breaks between) Assistive device: Rolling walker (2 wheeled) Gait Pattern/deviations: Step-to pattern;Decreased stance time - left Gait velocity: decreased   General Gait Details: cues for sequence, RW safety esp proper walker to self distance.  Limited amb distance due to pain and MAX c/o fatigue.   Stairs            Wheelchair Mobility    Modified Rankin (Stroke Patients Only)       Balance                                    Cognition                            Exercises      General Comments        Pertinent Vitals/Pain     Home Living                      Prior Function            PT Goals (current goals can now be found in the care plan section) Progress towards PT goals: Progressing toward goals    Frequency  7X/week    PT Plan      End of Session Equipment Utilized During Treatment: Gait belt Activity Tolerance: Patient limited by pain;Patient limited by fatigue Patient left: in chair;with call bell/phone within reach;with nursing/sitter in room     Time: 1335-1400 PT Time Calculation (min): 25 min  Charges:  $Gait Training: 8-22 mins $Therapeutic Activity: 8-22 mins                    G Codes:      Rica Koyanagi  PTA [facility]  Acute  Rehab Pager      [phone]

## 2014-02-11 LAB — CBC
HCT: 28.5 % — ABNORMAL LOW (ref 36.0–46.0)
Hemoglobin: 9.3 g/dL — ABNORMAL LOW (ref 12.0–15.0)
MCH: 29.7 pg (ref 26.0–34.0)
MCHC: 32.6 g/dL (ref 30.0–36.0)
MCV: 91.1 fL (ref 78.0–100.0)
PLATELETS: 241 10*3/uL (ref 150–400)
RBC: 3.13 MIL/uL — AB (ref 3.87–5.11)
RDW: 13.8 % (ref 11.5–15.5)
WBC: 11 10*3/uL — ABNORMAL HIGH (ref 4.0–10.5)

## 2014-02-11 NOTE — Progress Notes (Signed)
Noted updated therapy assessments today. [employer] has not made decision on rehab venue at this time. Pt is aware. [phone]

## 2014-02-11 NOTE — Progress Notes (Signed)
Physical Therapy Treatment Patient Details Name: Joanna Lee MRN: [id number] DOB: 1950-12-10 Today's Date: 02/11/2014    History of Present Illness s/p L TKA    PT Comments    POD # 3 pt progressing slowly with issues of nausea and then feeling "drunk" and groggy which is also effecting her balance and safety judgement.  Pt requires increased time and cueing for hand placement esp with stand to sit to control descend. Limited amb distance/tolerance. Pt is too unsafe to D/C to home and will need some sort of Inpt Rehab.   Follow Up Recommendations  CIR     Equipment Recommendations       Recommendations for Other Services       Precautions / Restrictions Precautions Precautions: Knee Precaution Comments: instructed pt on KI use for amb Required Braces or Orthoses: Knee Immobilizer - Left Knee Immobilizer - Left: Discontinue once straight leg raise with < 10 degree lag Restrictions Weight Bearing Restrictions: No Other Position/Activity Restrictions: WBAT    Mobility  Bed Mobility   Bed Mobility: Supine to Sit     Supine to sit: Min assist     General bed mobility comments: Pt OOB in recliner  Transfers Overall transfer level: Needs assistance Equipment used: Rolling walker (2 wheeled) Transfers: Sit to/from Stand Sit to Stand: Min assist         General transfer comment: cues for hand placement and increased time due to groggy/sleepy state  Ambulation/Gait Ambulation/Gait assistance: Min assist Ambulation Distance (Feet): 45 Feet Assistive device: Rolling walker (2 wheeled) Gait Pattern/deviations: Step-to pattern;Decreased stance time - left Gait velocity: decreased   General Gait Details: cues for sequence, RW safety esp proper walker to self distance.  Limited amb distance due MAX c/o feeling "drunk" and groggy   [employer] Rankin (Stroke Patients Only)       Balance                                     Cognition Arousal/Alertness: Awake/alert Behavior During Therapy: WFL for tasks assessed/performed Overall Cognitive Status: Within Functional Limits for tasks assessed                      Exercises      General Comments        Pertinent Vitals/Pain     Home Living                      Prior Function            PT Goals (current goals can now be found in the care plan section) Progress towards PT goals: Progressing toward goals    Frequency  7X/week    PT Plan      End of Session Equipment Utilized During Treatment: Gait belt Activity Tolerance: Patient limited by fatigue Patient left: in chair;with call bell/phone within reach;with nursing/sitter in room     Time: 1315-1340 PT Time Calculation (min): 25 min  Charges:  $Gait Training: 8-22 mins $Therapeutic Exercise: 8-22 mins                    G Codes:      Rica Koyanagi  PTA [facility]  Acute  Rehab Pager      [phone]

## 2014-02-11 NOTE — Progress Notes (Addendum)
I await insurance decision from [employer] on inpt rehab admission. Noted pt for d/c today. I will update everyone once decision from insurance finalized. I also await therapy update from today. [phone]

## 2014-02-11 NOTE — Progress Notes (Signed)
Occupational Therapy Treatment Patient Details Name: Joanna Lee MRN: [id number] DOB: 1951/08/06 Today's Date: 02/11/2014    History of present illness s/p L TKA   OT comments  Pt needs mod A for LB adls.  Min A for bed mobility and transfers.  Progressing slowly  Follow Up Recommendations  CIR    Equipment Recommendations  3 in 1 bedside comode    Recommendations for Other Services      Precautions / Restrictions Precautions Required Braces or Orthoses: Knee Immobilizer - Left Knee Immobilizer - Left: Discontinue once straight leg raise with < 10 degree lag Restrictions Other Position/Activity Restrictions: WBAT       Mobility Bed Mobility   Bed Mobility: Supine to Sit     Supine to sit: Min assist     General bed mobility comments: used sheet to assist with moving  LLE out of bed  Transfers     Transfers: Sit to/from Stand Sit to Stand: Min assist         General transfer comment: cues for hand placement    Balance                                   ADL         Lower Body Bathing: Minimal assistance;Sit to/from stand;With adaptive equipment Lower Body Dressing: Moderate assistance;Sit to/from stand;With adaptive equipment Toilet Transfer: Minimal assistance;Ambulation;Comfort height toilet;RW;Grab bars       General ADL Comments: used reacher and sock aid for RLE.  Unable to support LLE with RLE (hooking) to adequately clear pants over heel.  Unable to use sock aide on this foot      Vision                     Perception     Praxis      Cognition   Behavior During Therapy: [facility] for tasks assessed/performed Overall Cognitive Status: Within Functional Limits for tasks assessed                       Extremity/Trunk Assessment               Exercises       General Comments      Pertinent Vitals/ Pain       5/10 with weightbearing.  Pt had dizziness earlier but none during OT session  Home  Living                                          Prior Functioning/Environment              Frequency       Progress Toward Goals  OT Goals(current goals can now be found in the care plan section)  Progress towards OT goals: Progressing toward goals     Plan      End of Session    Activity Tolerance Patient tolerated treatment well   Patient Left in chair;with call bell/phone within reach;with family/visitor present   Nurse Communication          Time: 7425-9563 OT Time Calculation (min): 23 min  Charges: OT General Charges $OT Visit: 1 Procedure OT Treatments $Self Care/Home Management : 23-37 mins  Joanna Lee 02/11/2014, 1:13 PM  Lesle Chris, OTR/L [phone] 02/11/2014

## 2014-02-11 NOTE — Progress Notes (Signed)
   Subjective: 3 Days Post-Op Procedure(s) (LRB): LEFT TOTAL KNEE ARTHROPLASTY (Left) Patient reports pain as mild and moderate.   Patient seen in rounds for Dr. Wynelle Link.  She is doing a some better today.  Waiting on insurance approval for CIR. Patient is well, but has had some minor complaints of pain in the knee, requiring pain medications Patient is ready to go CIR if insurance approves an bed available.  Objective: Vital signs in last 24 hours: Temp:  [98 F (36.7 C)-99.1 F (37.3 C)] 98.9 F (37.2 C) (03/26 0631) Pulse Rate:  [64-99] 80 (03/26 0631) Resp:  [16-18] 16 (03/26 0631) BP: (120-148)/(73-110) 125/73 mmHg (03/26 0631) SpO2:  [95 %-99 %] 98 % (03/26 0631)  Intake/Output from previous day:  Intake/Output Summary (Last 24 hours) at 02/11/14 0747 Last data filed at 02/11/14 7858  Gross per 24 hour  Intake    420 ml  Output   2200 ml  Net  -1780 ml    Intake/Output this shift:    Labs:  Recent Labs  02/09/14 0400 02/10/14 0354 02/11/14 0408  HGB 9.5* 9.6* 9.3*    Recent Labs  02/10/14 0354 02/11/14 0408  WBC 10.1 11.0*  RBC 3.21* 3.13*  HCT 29.1* 28.5*  PLT 248 241    Recent Labs  02/09/14 0400 02/10/14 0354  NA 139 137  K 3.8 3.9  CL 104 100  CO2 24 28  BUN 11 12  CREATININE 0.58 0.65  GLUCOSE 149* 125*  CALCIUM 9.2 9.7   No results found for this basename: LABPT, INR,  in the last 72 hours  EXAM: General - Patient is Alert, Appropriate and Oriented Extremity - Neurovascular intact Sensation intact distally Dorsiflexion/Plantar flexion intact Incision - clean, dry, no drainage, healing Motor Function - intact, moving foot and toes well on exam.   Assessment/Plan: 3 Days Post-Op Procedure(s) (LRB): LEFT TOTAL KNEE ARTHROPLASTY (Left) Procedure(s) (LRB): LEFT TOTAL KNEE ARTHROPLASTY (Left) Past Medical History  Diagnosis Date  . HTN (hypertension)   . Vitamin D deficiency   . Rheumatoid arthritis(714.0)   . Migraines   .  Vertigo   . Osteopenia   . PONV (postoperative nausea and vomiting)     also states "SLOW TO WAKE UP"  . History of blood transfusion   . Sleep apnea     hasnt  used CPAP in years  . Carotid artery aneurysm 2005   Principal Problem:   OA (osteoarthritis) of knee Active Problems:   Postoperative anemia due to acute blood loss  Estimated body mass index is 34.55 kg/(m^2) as calculated from the following:   Height as of this encounter: 5\' 3"  (1.6 m).   Weight as of this encounter: 88.45 kg (195 lb). Up with therapy Discharge to CIR Diet - Cardiac diet Follow up - in 2 weeks Activity - WBAT Disposition - Rehab Condition Upon Discharge - Good D/C Meds - See DC Summary DVT Prophylaxis - Xarelto  PERKINS, ALEXZANDREW 02/11/2014, 7:47 AM

## 2014-02-12 ENCOUNTER — Encounter: Payer: Self-pay | Admitting: *Deleted

## 2014-02-12 NOTE — Progress Notes (Signed)
I contacted CM at [facility] and await their approval for inpt rehab vs SNF rehab today. I will contact RN CM and SW to discuss. [phone]

## 2014-02-12 NOTE — Progress Notes (Signed)
CSW assisting with d/c planning. [employer] approved SNF placement at [facility], pt's choice facility. CIR request has been withdrawn. [facility] to [place] contracted facility for rehab but pt prefers [facility] ( not in contract with [place] ). [facility] updated that contract is not needed at this time. CSW will assist with d/c planning to sNF today.  Werner Lean LCSW [phone]

## 2014-02-12 NOTE — Progress Notes (Signed)
Physical Therapy Treatment Patient Details Name: Joanna Lee MRN: [id number] DOB: September 05, 1951 Today's Date: 2014/03/01    History of Present Illness s/p L TKA    PT Comments    Marked improvement in activity tolerance.   Follow Up Recommendations  SNF     Equipment Recommendations  Rolling walker with 5" wheels    Recommendations for Other Services       Precautions / Restrictions Precautions Precautions: Knee Precaution Comments: instructed pt on KI use for amb Required Braces or Orthoses: Knee Immobilizer - Left Knee Immobilizer - Left: Discontinue once straight leg raise with < 10 degree lag Restrictions Weight Bearing Restrictions: No Other Position/Activity Restrictions: WBAT    Mobility  Bed Mobility Overal bed mobility: Needs Assistance Bed Mobility: Supine to Sit     Supine to sit: Min assist     General bed mobility comments: cues for use of R LE to self assist; min assist with L LE  Transfers Overall transfer level: Needs assistance Equipment used: Rolling walker (2 wheeled) Transfers: Sit to/from Stand Sit to Stand: Min assist         General transfer comment: cues for LE management and use of UEs to self assist  Ambulation/Gait Ambulation/Gait assistance: Min assist Ambulation Distance (Feet): 80 Feet (twice) Assistive device: Rolling walker (2 wheeled) Gait Pattern/deviations: Step-to pattern;Decreased step length - right;Decreased step length - left;Shuffle;Trunk flexed Gait velocity: decreased   General Gait Details: cues for initial sequence, posture and position from [employer]            Wheelchair Mobility    Modified Rankin (Stroke Patients Only)       Balance                                    Cognition Arousal/Alertness: Awake/alert Behavior During Therapy: WFL for tasks assessed/performed Overall Cognitive Status: Within Functional Limits for tasks assessed                       Exercises Total Joint Exercises Ankle Circles/Pumps: AROM;Both;20 reps;Supine Quad Sets: AROM;Both;20 reps;Supine Heel Slides: AAROM;Left;20 reps;Supine Straight Leg Raises: AAROM;Left;20 reps;Supine Goniometric ROM: AAROM at knee -10- 50    General Comments        Pertinent Vitals/Pain 5/10; pt declines Pain meds at this time.  Ice pack provided    Home Living                      Prior Function            PT Goals (current goals can now be found in the care plan section) Acute Rehab PT Goals Patient Stated Goal: I; pt wants to go to rehab PT Goal Formulation: With patient Time For Goal Achievement: 02/15/14 Potential to Achieve Goals: Good Progress towards PT goals: Progressing toward goals    Frequency  7X/week    PT Plan Current plan remains appropriate    End of Session Equipment Utilized During Treatment: Gait belt;Left knee immobilizer Activity Tolerance: Patient tolerated treatment well Patient left: in chair;with call bell/phone within reach     Time: 1105-1137 PT Time Calculation (min): 32 min  Charges:  $Gait Training: 8-22 mins $Therapeutic Exercise: 8-22 mins                    G Codes:      Joanna Lee 2014-03-01,  12:10 PM

## 2014-02-12 NOTE — Progress Notes (Signed)
Clinical Social Work Department CLINICAL SOCIAL WORK PLACEMENT NOTE 02/12/2014  Patient:  Joanna Lee, Joanna Lee  Account Number:  [id number] Admit date:  02/08/2014  Clinical Social Worker:  Werner Lean, LCSW  Date/time:  02/12/2014 12:36 PM  Clinical Social Work is seeking post-discharge placement for this patient at the following level of care:   SKILLED NURSING   (*CSW will update this form in Epic as items are completed)     Patient/family provided with [facility]'s list of facilities offering this level of care within the geographic area requested by the patient (or if unable, by the patient's family).  02/12/2014  Patient/family informed of their freedom to choose among providers that offer the needed level of care, that participate in Medicare, Medicaid or managed care program needed by the patient, have an available bed and are willing to accept the patient.    Patient/family informed of [facility]' ownership interest in [facility], as well as of the fact that they are under no obligation to receive care at this facility.  PASARR submitted to [employer] on 02/09/2014 PASARR number received from [employer] on 02/09/2014  FL2 transmitted to all facilities in geographic area requested by pt/family on  02/12/2014 FL2 transmitted to all facilities within larger geographic area on   Patient informed that his/her managed care company has contracts with or will negotiate with  certain facilities, including the following:     Patient/family informed of bed offers received:  02/12/2014 Patient chooses bed at [facility]ician recommends and patient chooses bed at    Patient to be transferred to [facility] on  02/12/2014 Patient to be transferred to facility by [facility]  The following physician request were entered in Epic:   Additional Comments:  Werner Lean LCSW [phone]

## 2014-02-12 NOTE — Discharge Summary (Signed)
ADDENDUM - Physician Discharge Summary   Patient ID: ANIS CINELLI MRN: [id number] DOB/AGE: [birth year] 63 y.o.  Admit date: 02/08/2014 Discharge date: New Date of Discharge - 02/12/2014  Primary Diagnosis:  Osteoarthritis Left knee(s)  Admission Diagnoses:  Past Medical History  Diagnosis Date  . HTN (hypertension)   . Vitamin D deficiency   . Rheumatoid arthritis(714.0)   . Migraines   . Vertigo   . Osteopenia   . PONV (postoperative nausea and vomiting)     also states "SLOW TO WAKE UP"  . History of blood transfusion   . Sleep apnea     hasnt  used CPAP in years  . Carotid artery aneurysm 2005   Discharge Diagnoses:   Principal Problem:   OA (osteoarthritis) of knee Active Problems:   Postoperative anemia due to acute blood loss  Estimated body mass index is 34.55 kg/(m^2) as calculated from the following:   Height as of this encounter: 5' 3" (1.6 m).   Weight as of this encounter: 88.45 kg (195 lb).  Procedure:  Procedure(s) (LRB): LEFT TOTAL KNEE ARTHROPLASTY (Left)   Consults: [facility]  HPI: Joanna Lee is a 63 y.o. year old female with end stage OA of her left knee with progressively worsening pain and dysfunction. She has constant pain, with activity and at rest and significant functional deficits with difficulties even with ADLs. She has had extensive non-op management including analgesics, injections of cortisone and viscosupplements, and home exercise program, but remains in significant pain with significant dysfunction. Radiographs show bone on bone arthritis medial and patellofemoral. She presents now for left Total Knee Arthroplasty.   Laboratory Data: Admission on 02/08/2014  Component Date Value Ref Range Status  . WBC 02/09/2014 9.5  4.0 - 10.5 K/uL Final  . RBC 02/09/2014 3.09* 3.87 - 5.11 MIL/uL Final  . Hemoglobin 02/09/2014 9.5* 12.0 - 15.0 g/dL Final   REPEATED TO VERIFY  . HCT 02/09/2014 27.9* 36.0 - 46.0 % Final  . MCV 02/09/2014  90.3  78.0 - 100.0 fL Final  . MCH 02/09/2014 30.7  26.0 - 34.0 pg Final  . MCHC 02/09/2014 34.1  30.0 - 36.0 g/dL Final  . RDW 02/09/2014 13.4  11.5 - 15.5 % Final  . Platelets 02/09/2014 235  150 - 400 K/uL Final  . Sodium 02/09/2014 139  137 - 147 mEq/L Final  . Potassium 02/09/2014 3.8  3.7 - 5.3 mEq/L Final  . Chloride 02/09/2014 104  96 - 112 mEq/L Final  . CO2 02/09/2014 24  19 - 32 mEq/L Final  . Glucose, Bld 02/09/2014 149* 70 - 99 mg/dL Final  . BUN 02/09/2014 11  6 - 23 mg/dL Final  . Creatinine, Ser 02/09/2014 0.58  0.50 - 1.10 mg/dL Final  . Calcium 02/09/2014 9.2  8.4 - 10.5 mg/dL Final  . GFR calc non Af Amer 02/09/2014 >90  >90 mL/min Final  . GFR calc Af Amer 02/09/2014 >90  >90 mL/min Final   Comment: (NOTE)                          The eGFR has been calculated using the CKD EPI equation.                          This calculation has not been validated in all clinical situations.  eGFR's persistently <90 mL/min signify possible Chronic Kidney                          Disease.  . WBC 02/10/2014 10.1  4.0 - 10.5 K/uL Final  . RBC 02/10/2014 3.21* 3.87 - 5.11 MIL/uL Final  . Hemoglobin 02/10/2014 9.6* 12.0 - 15.0 g/dL Final  . HCT 02/10/2014 29.1* 36.0 - 46.0 % Final  . MCV 02/10/2014 90.7  78.0 - 100.0 fL Final  . MCH 02/10/2014 29.9  26.0 - 34.0 pg Final  . MCHC 02/10/2014 33.0  30.0 - 36.0 g/dL Final  . RDW 02/10/2014 13.5  11.5 - 15.5 % Final  . Platelets 02/10/2014 248  150 - 400 K/uL Final  . Sodium 02/10/2014 137  137 - 147 mEq/L Final  . Potassium 02/10/2014 3.9  3.7 - 5.3 mEq/L Final  . Chloride 02/10/2014 100  96 - 112 mEq/L Final  . CO2 02/10/2014 28  19 - 32 mEq/L Final  . Glucose, Bld 02/10/2014 125* 70 - 99 mg/dL Final  . BUN 02/10/2014 12  6 - 23 mg/dL Final  . Creatinine, Ser 02/10/2014 0.65  0.50 - 1.10 mg/dL Final  . Calcium 02/10/2014 9.7  8.4 - 10.5 mg/dL Final  . GFR calc non Af Amer 02/10/2014 >90  >90 mL/min Final  .  GFR calc Af Amer 02/10/2014 >90  >90 mL/min Final   Comment: (NOTE)                          The eGFR has been calculated using the CKD EPI equation.                          This calculation has not been validated in all clinical situations.                          eGFR's persistently <90 mL/min signify possible Chronic Kidney                          Disease.  . WBC 02/11/2014 11.0* 4.0 - 10.5 K/uL Final  . RBC 02/11/2014 3.13* 3.87 - 5.11 MIL/uL Final  . Hemoglobin 02/11/2014 9.3* 12.0 - 15.0 g/dL Final  . HCT 02/11/2014 28.5* 36.0 - 46.0 % Final  . MCV 02/11/2014 91.1  78.0 - 100.0 fL Final  . MCH 02/11/2014 29.7  26.0 - 34.0 pg Final  . MCHC 02/11/2014 32.6  30.0 - 36.0 g/dL Final  . RDW 02/11/2014 13.8  11.5 - 15.5 % Final  . Platelets 02/11/2014 241  150 - 400 K/uL Final  Hospital Outpatient Visit on 02/02/2014  Component Date Value Ref Range Status  . aPTT 02/02/2014 30  24 - 37 seconds Final  . WBC 02/02/2014 6.7  4.0 - 10.5 K/uL Final  . RBC 02/02/2014 4.17  3.87 - 5.11 MIL/uL Final  . Hemoglobin 02/02/2014 12.7  12.0 - 15.0 g/dL Final  . HCT 02/02/2014 37.5  36.0 - 46.0 % Final  . MCV 02/02/2014 89.9  78.0 - 100.0 fL Final  . MCH 02/02/2014 30.5  26.0 - 34.0 pg Final  . MCHC 02/02/2014 33.9  30.0 - 36.0 g/dL Final  . RDW 02/02/2014 13.3  11.5 - 15.5 % Final  . Platelets 02/02/2014 290  150 - 400 K/uL Final  . Sodium 02/02/2014  140  137 - 147 mEq/L Final  . Potassium 02/02/2014 3.5* 3.7 - 5.3 mEq/L Final  . Chloride 02/02/2014 100  96 - 112 mEq/L Final  . CO2 02/02/2014 29  19 - 32 mEq/L Final  . Glucose, Bld 02/02/2014 116* 70 - 99 mg/dL Final  . BUN 02/02/2014 11  6 - 23 mg/dL Final  . Creatinine, Ser 02/02/2014 0.66  0.50 - 1.10 mg/dL Final  . Calcium 02/02/2014 10.6* 8.4 - 10.5 mg/dL Final  . Total Protein 02/02/2014 7.6  6.0 - 8.3 g/dL Final  . Albumin 02/02/2014 3.6  3.5 - 5.2 g/dL Final  . AST 02/02/2014 17  0 - 37 U/L Final  . ALT 02/02/2014 18  0 - 35 U/L  Final  . Alkaline Phosphatase 02/02/2014 84  39 - 117 U/L Final  . Total Bilirubin 02/02/2014 0.3  0.3 - 1.2 mg/dL Final  . GFR calc non Af Amer 02/02/2014 >90  >90 mL/min Final  . GFR calc Af Amer 02/02/2014 >90  >90 mL/min Final   Comment: (NOTE)                          The eGFR has been calculated using the CKD EPI equation.                          This calculation has not been validated in all clinical situations.                          eGFR's persistently <90 mL/min signify possible Chronic Kidney                          Disease.  Marland Kitchen Prothrombin Time 02/02/2014 12.6  11.6 - 15.2 seconds Final  . INR 02/02/2014 0.96  0.00 - 1.49 Final  . ABO/RH(D) 02/02/2014 A POS   Final  . Antibody Screen 02/02/2014 NEG   Final  . Sample Expiration 02/02/2014 02/11/2014   Final  . Color, Urine 02/02/2014 YELLOW  YELLOW Final  . APPearance 02/02/2014 CLEAR  CLEAR Final  . Specific Gravity, Urine 02/02/2014 1.025  1.005 - 1.030 Final  . pH 02/02/2014 5.0  5.0 - 8.0 Final  . Glucose, UA 02/02/2014 NEGATIVE  NEGATIVE mg/dL Final  . Hgb urine dipstick 02/02/2014 NEGATIVE  NEGATIVE Final  . Bilirubin Urine 02/02/2014 NEGATIVE  NEGATIVE Final  . Ketones, ur 02/02/2014 NEGATIVE  NEGATIVE mg/dL Final  . Protein, ur 02/02/2014 NEGATIVE  NEGATIVE mg/dL Final  . Urobilinogen, UA 02/02/2014 0.2  0.0 - 1.0 mg/dL Final  . Nitrite 02/02/2014 NEGATIVE  NEGATIVE Final  . Leukocytes, UA 02/02/2014 NEGATIVE  NEGATIVE Final   MICROSCOPIC NOT DONE ON URINES WITH NEGATIVE PROTEIN, BLOOD, LEUKOCYTES, NITRITE, OR GLUCOSE <1000 mg/dL.  Marland Kitchen MRSA, PCR 02/02/2014 NEGATIVE  NEGATIVE Final  . Staphylococcus aureus 02/02/2014 NEGATIVE  NEGATIVE Final   Comment:                                 The Xpert SA Assay (FDA                          approved for NASAL specimens  in patients over 32 years of age),                          is one component of                          a comprehensive  surveillance                          program.  Test performance has                          been validated by [employer] for patients greater                          than or equal to 86 year old.                          It is not intended                          to diagnose infection nor to                          guide or monitor treatment.  . ABO/RH(D) 02/02/2014 A POS   Final     X-Rays:Mr Mra Head Wo Contrast  02/04/2014   CLINICAL DATA:  Followup left cervical ICA aneurysm, status post endovascular coil embolization.  EXAM: MRI HEAD WITH CONTRAST AND MRA HEAD WITH CONTRAST AND MRI NECK WITHOUT AND WITH CONTRAST  TECHNIQUE: Multiplanar, multiecho pulse sequences of the brain and surrounding structures were obtained with intravenous contrast. Angiographic images of the head were obtained using MRA technique with contrast. Multiplanar, multiecho pulse sequences of the neck and surrounding structures were obtained without and with intravenous contrast.  CONTRAST:  55m MULTIHANCE GADOBENATE DIMEGLUMINE 529 MG/ML IV SOLN  COMPARISON:  Prior MRI/ MRA and from 12/13/2012.  FINDINGS: MRI HEAD FINDINGS  The CSF containing spaces are within normal limits for patient age. Few scattered T2/FLAIR hyperintensities within the periventricular and deep white matter are stable as compared to prior exam, and likely related to mild chronic microvascular ischemic changes. No mass lesion, midline shift, or extra-axial fluid collection. Ventricles are normal in size without evidence of hydrocephalus. No abnormal enhancement.  No diffusion-weighted signal abnormality is identified to suggest acute intracranial infarct. Gray-white matter differentiation is maintained. Normal flow voids are seen within the intracranial vasculature. No intracranial hemorrhage identified.  The cervicomedullary junction is normal. Pituitary gland is within normal limits. Pituitary stalk is midline. The globes and  optic nerves demonstrate a normal appearance with normal signal intensity. The  The bone marrow signal intensity is normal. Calvarium is intact. Visualized upper cervical spine is within normal limits.  Scalp soft tissues are unremarkable.  Paranasal sinuses are clear.  No mastoid effusion.  MRA HEAD FINDINGS  The visualized portions of the distal cervical internal carotid arteries are widely patent with antegrade flow. The petrous, cavernous, and supra clinoid segments are patent bilaterally. Mild irregularity and dolichoectasia of the cavernous segment of the right ICA and is stable. Ophthalmic artery  origins remain normal. Carotid termini are patent. MCA and ACA origins are stable.  Mild irregularity in of the left A1 segment is noted. The MCA arteries are stable bilaterally without proximal branch occlusion, high-grade stenosis, or aneurysm. MCA bifurcations are normal. Distal MCA branches are symmetric.  The vertebral arteries are widely patent with antegrade flow. The right vertebral artery is slightly diminutive. The posterior inferior cerebral arteries are normal. Vertebrobasilar junction and basilar artery are widely patent with antegrade flow without evidence of basilar tip stenosis or aneurysm. Posterior cerebral arteries are normal bilaterally. The superior cerebellar arteries and anterior inferior cerebellar arteries are widely patent bilaterally. No intracranial aneurysm within the posterior circulation.  MRI NECK FINDINGS  The time-of-flight images demonstrate normal appearance of the aortic arch with normal 3 vessel morphology. Widely patent antegrade flow present within both common carotid arteries which are mildly tortuous. Vertebral arteries are widely patent with antegrade flow. Vascular tortuosity again noted.  Postcontrast MRA images demonstrate no significant stenosis at the origin of the great vessels.  Tortuosity of the bilateral vertebral arteries with occasional kinking, left greater than  right, is similar. Vertebral arteries are codominant within the neck.  The right carotid bifurcation and right internal carotid artery are widely patent. Severe tortuosity of the proximal cervical right ICA again noted.  Susceptibility artifact associated with previously treated coiled segment of the left internal carotid artery. Stable small focus of enhancement at the proximal aspect of the coiled segment. No new aneurysmal growth. Distal left ICA is within normal limits. The left carotid bifurcation is widely patent.  IMPRESSION: MRI BRAIN:  1. Stable and negative for age MRI appearance of the brain.  MRA BRAIN:  1. Stable MRA of the brain. No intracranial stenosis, intracranial aneurysm, or proximal branch occlusion identified.  MRA NECK:  1. Stable MRI appearance of the neck with minimal neck remnant at the previously treated cervical left ICA coiled pseudoaneurysm. 2. Diffuse vessel tortuosity in the neck with no high-grade stenosis identified.   Electronically Signed   By: Jeannine Boga M.D.   On: 02/04/2014 22:08   Mr Angiogram Neck W Wo Contrast  02/04/2014   CLINICAL DATA:  Followup left cervical ICA aneurysm, status post endovascular coil embolization.  EXAM: MRI HEAD WITH CONTRAST AND MRA HEAD WITH CONTRAST AND MRI NECK WITHOUT AND WITH CONTRAST  TECHNIQUE: Multiplanar, multiecho pulse sequences of the brain and surrounding structures were obtained with intravenous contrast. Angiographic images of the head were obtained using MRA technique with contrast. Multiplanar, multiecho pulse sequences of the neck and surrounding structures were obtained without and with intravenous contrast.  CONTRAST:  81m MULTIHANCE GADOBENATE DIMEGLUMINE 529 MG/ML IV SOLN  COMPARISON:  Prior MRI/ MRA and from 12/13/2012.  FINDINGS: MRI HEAD FINDINGS  The CSF containing spaces are within normal limits for patient age. Few scattered T2/FLAIR hyperintensities within the periventricular and deep white matter are stable  as compared to prior exam, and likely related to mild chronic microvascular ischemic changes. No mass lesion, midline shift, or extra-axial fluid collection. Ventricles are normal in size without evidence of hydrocephalus. No abnormal enhancement.  No diffusion-weighted signal abnormality is identified to suggest acute intracranial infarct. Gray-white matter differentiation is maintained. Normal flow voids are seen within the intracranial vasculature. No intracranial hemorrhage identified.  The cervicomedullary junction is normal. Pituitary gland is within normal limits. Pituitary stalk is midline. The globes and optic nerves demonstrate a normal appearance with normal signal intensity. The  The bone marrow signal intensity is  normal. Calvarium is intact. Visualized upper cervical spine is within normal limits.  Scalp soft tissues are unremarkable.  Paranasal sinuses are clear.  No mastoid effusion.  MRA HEAD FINDINGS  The visualized portions of the distal cervical internal carotid arteries are widely patent with antegrade flow. The petrous, cavernous, and supra clinoid segments are patent bilaterally. Mild irregularity and dolichoectasia of the cavernous segment of the right ICA and is stable. Ophthalmic artery origins remain normal. Carotid termini are patent. MCA and ACA origins are stable.  Mild irregularity in of the left A1 segment is noted. The MCA arteries are stable bilaterally without proximal branch occlusion, high-grade stenosis, or aneurysm. MCA bifurcations are normal. Distal MCA branches are symmetric.  The vertebral arteries are widely patent with antegrade flow. The right vertebral artery is slightly diminutive. The posterior inferior cerebral arteries are normal. Vertebrobasilar junction and basilar artery are widely patent with antegrade flow without evidence of basilar tip stenosis or aneurysm. Posterior cerebral arteries are normal bilaterally. The superior cerebellar arteries and anterior  inferior cerebellar arteries are widely patent bilaterally. No intracranial aneurysm within the posterior circulation.  MRI NECK FINDINGS  The time-of-flight images demonstrate normal appearance of the aortic arch with normal 3 vessel morphology. Widely patent antegrade flow present within both common carotid arteries which are mildly tortuous. Vertebral arteries are widely patent with antegrade flow. Vascular tortuosity again noted.  Postcontrast MRA images demonstrate no significant stenosis at the origin of the great vessels.  Tortuosity of the bilateral vertebral arteries with occasional kinking, left greater than right, is similar. Vertebral arteries are codominant within the neck.  The right carotid bifurcation and right internal carotid artery are widely patent. Severe tortuosity of the proximal cervical right ICA again noted.  Susceptibility artifact associated with previously treated coiled segment of the left internal carotid artery. Stable small focus of enhancement at the proximal aspect of the coiled segment. No new aneurysmal growth. Distal left ICA is within normal limits. The left carotid bifurcation is widely patent.  IMPRESSION: MRI BRAIN:  1. Stable and negative for age MRI appearance of the brain.  MRA BRAIN:  1. Stable MRA of the brain. No intracranial stenosis, intracranial aneurysm, or proximal branch occlusion identified.  MRA NECK:  1. Stable MRI appearance of the neck with minimal neck remnant at the previously treated cervical left ICA coiled pseudoaneurysm. 2. Diffuse vessel tortuosity in the neck with no high-grade stenosis identified.   Electronically Signed   By: Jeannine Boga M.D.   On: 02/04/2014 22:08   Mr Jeri Cos XH Contrast  02/04/2014   CLINICAL DATA:  Followup left cervical ICA aneurysm, status post endovascular coil embolization.  EXAM: MRI HEAD WITH CONTRAST AND MRA HEAD WITH CONTRAST AND MRI NECK WITHOUT AND WITH CONTRAST  TECHNIQUE: Multiplanar, multiecho pulse  sequences of the brain and surrounding structures were obtained with intravenous contrast. Angiographic images of the head were obtained using MRA technique with contrast. Multiplanar, multiecho pulse sequences of the neck and surrounding structures were obtained without and with intravenous contrast.  CONTRAST:  76m MULTIHANCE GADOBENATE DIMEGLUMINE 529 MG/ML IV SOLN  COMPARISON:  Prior MRI/ MRA and from 12/13/2012.  FINDINGS: MRI HEAD FINDINGS  The CSF containing spaces are within normal limits for patient age. Few scattered T2/FLAIR hyperintensities within the periventricular and deep white matter are stable as compared to prior exam, and likely related to mild chronic microvascular ischemic changes. No mass lesion, midline shift, or extra-axial fluid collection. Ventricles are normal in size  without evidence of hydrocephalus. No abnormal enhancement.  No diffusion-weighted signal abnormality is identified to suggest acute intracranial infarct. Gray-white matter differentiation is maintained. Normal flow voids are seen within the intracranial vasculature. No intracranial hemorrhage identified.  The cervicomedullary junction is normal. Pituitary gland is within normal limits. Pituitary stalk is midline. The globes and optic nerves demonstrate a normal appearance with normal signal intensity. The  The bone marrow signal intensity is normal. Calvarium is intact. Visualized upper cervical spine is within normal limits.  Scalp soft tissues are unremarkable.  Paranasal sinuses are clear.  No mastoid effusion.  MRA HEAD FINDINGS  The visualized portions of the distal cervical internal carotid arteries are widely patent with antegrade flow. The petrous, cavernous, and supra clinoid segments are patent bilaterally. Mild irregularity and dolichoectasia of the cavernous segment of the right ICA and is stable. Ophthalmic artery origins remain normal. Carotid termini are patent. MCA and ACA origins are stable.  Mild  irregularity in of the left A1 segment is noted. The MCA arteries are stable bilaterally without proximal branch occlusion, high-grade stenosis, or aneurysm. MCA bifurcations are normal. Distal MCA branches are symmetric.  The vertebral arteries are widely patent with antegrade flow. The right vertebral artery is slightly diminutive. The posterior inferior cerebral arteries are normal. Vertebrobasilar junction and basilar artery are widely patent with antegrade flow without evidence of basilar tip stenosis or aneurysm. Posterior cerebral arteries are normal bilaterally. The superior cerebellar arteries and anterior inferior cerebellar arteries are widely patent bilaterally. No intracranial aneurysm within the posterior circulation.  MRI NECK FINDINGS  The time-of-flight images demonstrate normal appearance of the aortic arch with normal 3 vessel morphology. Widely patent antegrade flow present within both common carotid arteries which are mildly tortuous. Vertebral arteries are widely patent with antegrade flow. Vascular tortuosity again noted.  Postcontrast MRA images demonstrate no significant stenosis at the origin of the great vessels.  Tortuosity of the bilateral vertebral arteries with occasional kinking, left greater than right, is similar. Vertebral arteries are codominant within the neck.  The right carotid bifurcation and right internal carotid artery are widely patent. Severe tortuosity of the proximal cervical right ICA again noted.  Susceptibility artifact associated with previously treated coiled segment of the left internal carotid artery. Stable small focus of enhancement at the proximal aspect of the coiled segment. No new aneurysmal growth. Distal left ICA is within normal limits. The left carotid bifurcation is widely patent.  IMPRESSION: MRI BRAIN:  1. Stable and negative for age MRI appearance of the brain.  MRA BRAIN:  1. Stable MRA of the brain. No intracranial stenosis, intracranial aneurysm,  or proximal branch occlusion identified.  MRA NECK:  1. Stable MRI appearance of the neck with minimal neck remnant at the previously treated cervical left ICA coiled pseudoaneurysm. 2. Diffuse vessel tortuosity in the neck with no high-grade stenosis identified.   Electronically Signed   By: Jeannine Boga M.D.   On: 02/04/2014 22:08    EKG: Orders placed in visit on 02/02/14  . EKG 12-LEAD     Hospital Course: Joanna Lee is a 64 y.o. who was admitted to [facility]. They were brought to the operating room on 02/08/2014 and underwent Procedure(s):  LEFT TOTAL KNEE ARTHROPLASTY. Patient tolerated the procedure well and was later transferred to the recovery room and then to the orthopaedic floor for postoperative care. They were given PO and IV analgesics for pain control following their surgery. They were given 24 hours of  postoperative antibiotics of  Anti-infectives    Start    Dose/Rate  Route  Frequency  Ordered  Stop    02/08/14 1400   ceFAZolin (ANCEF) IVPB 2 g/50 mL premix  2 g  100 mL/hr over 30 Minutes  Intravenous  Every 6 hours  02/08/14 1057  02/08/14 2113    02/08/14 0508   ceFAZolin (ANCEF) IVPB 2 g/50 mL premix  2 g  100 mL/hr over 30 Minutes  Intravenous  On call to O.R.  02/08/14 5852  02/08/14 0716     and started on DVT prophylaxis in the form of Xarelto. PT and OT were ordered for total joint protocol. Discharge planning consulted to help with postop disposition and equipment needs. Consulted CIR to look into CIR since she has no coverage for SNF through her insurance. Patient had a tough night on the evening of surgery mainly due to nausea. They started to get up OOB with therapy on day one. Hemovac drain was pulled without difficulty. Continued to work with therapy into day two. Dressing was changed on day two and the incision was healing well. By day three, the patient had progressed with therapy and meeting their goals. Incision was healing well. Patient was  seen in rounds and was ready to go the rehab facility for continued inpatient therapy and care. Waiting on final insurance approval at time of summary.  ADDENDUM - Patient did not hear from [facility] on Thrusday 3/36/2015 so she remained in the hospital another day.  She was seen in rounds on Friday, POD 4, and she was doing a little better. She was sitting up in bed eating breakfast that morning. Pain was better today. Patient waiting on insurance approval for CIR. Did not receive approval yesterday so she stayed in the hospital through the night. Social worker was working on other options since [employer] may not come through. Possible [facility] benefits and bed at [facility]. Hopefully, the patient will hear about one of the two options today, Friday, POD 4. Will setup transfer today in anticipation of approval on one of the beds.   Discharge Medications: Prior to Admission medications   Medication Sig Start Date End Date Taking? Authorizing Provider  acetaminophen (TYLENOL) 325 MG tablet Take 2 tablets (650 mg total) by mouth every 6 (six) hours as needed for mild pain (or Fever >/= 101). 02/10/14   Alexzandrew Perkins, PA-C  bisacodyl (DULCOLAX) 10 MG suppository Place 1 suppository (10 mg total) rectally daily as needed for moderate constipation. 02/10/14   Alexzandrew Perkins, PA-C  docusate sodium 100 MG CAPS Take 100 mg by mouth 2 (two) times daily. 02/10/14   Alexzandrew Perkins, PA-C  HYDROmorphone (DILAUDID) 2 MG tablet Take 1-2 tablets (2-4 mg total) by mouth every 3 (three) hours as needed for moderate pain or severe pain. 02/10/14   Alexzandrew Dara Lords, PA-C  methocarbamol (ROBAXIN) 500 MG tablet Take 1 tablet (500 mg total) by mouth every 6 (six) hours as needed for muscle spasms. 02/10/14   Alexzandrew Dara Lords, PA-C  metoCLOPramide (REGLAN) 5 MG tablet Take 1-2 tablets (5-10 mg total) by mouth every 8 (eight) hours as needed for nausea (if ondansetron (ZOFRAN) ineffective.). 02/10/14   Alexzandrew Perkins,  PA-C  ondansetron (ZOFRAN) 4 MG tablet Take 1 tablet (4 mg total) by mouth every 6 (six) hours as needed for nausea. 02/10/14   Alexzandrew Perkins, PA-C  polyethylene glycol (MIRALAX / GLYCOLAX) packet Take 17 g by mouth daily as needed for mild constipation. 02/10/14  Adrielle Polakowski, PA-C  potassium chloride SA (K-DUR,KLOR-CON) 20 MEQ tablet Take 20 mEq by mouth daily.    Historical Provider, MD  rivaroxaban (XARELTO) 10 MG TABS tablet Take 1 tablet (10 mg total) by mouth daily with breakfast. Take Xarelto for two and a half more weeks, then discontinue Xarelto. Once the patient has completed the Xarelto, they may resume the 81 mg Aspirin. 02/10/14   Ivelisse Culverhouse, PA-C  traMADol (ULTRAM) 50 MG tablet Take 1-2 tablets (50-100 mg total) by mouth every 6 (six) hours as needed (mild to moderate pain). 02/10/14   Rowan Pollman, PA-C  valsartan-hydrochlorothiazide (DIOVAN-HCT) 160-12.5 MG per tablet Take 1 tablet by mouth at bedtime.    Historical Provider, MD    Discharge to CIR or possible [facility] today.  Diet - Cardiac diet  Follow up - in 2 weeks  Activity - WBAT  Disposition - Rehab  Condition Upon Discharge - Good  D/C Meds - See DC Summary  DVT Prophylaxis - Xarelto       Discharge Orders   Future Orders Complete By Expires   Call MD / Call 911  As directed    Comments:     If you experience chest pain or shortness of breath, CALL 911 and be transported to the hospital emergency room.  If you develope a fever above 101 F, pus (white drainage) or increased drainage or redness at the wound, or calf pain, call your surgeon's office.   Change dressing  As directed    Comments:     Change dressing daily with sterile 4 x 4 inch gauze dressing and apply TED hose. Do not submerge the incision under water.   Constipation Prevention  As directed    Comments:     Drink plenty of fluids.  Prune juice may be helpful.  You may use a stool softener, such as Colace (over the  counter) 100 mg twice a day.  Use MiraLax (over the counter) for constipation as needed.   Diet general  As directed    Discharge instructions  As directed    Comments:     Pick up stool softner and laxative for home. Do not submerge incision under water. May shower. Continue to use ice for pain and swelling from surgery.  Take Xarelto for two and a half more weeks, then discontinue Xarelto. Once the patient has completed the Xarelto, they may resume the 81 mg Aspirin.  When discharged from the skilled rehab facility, please have the facility set up the patient's [place] prior to being released.  Also provide the patient with their medications at time of release from the facility to include their pain medication, the muscle relaxants, and their blood thinner medication.  If the patient is still at the rehab facility at time of follow up appointment, please also assist the patient in arranging follow up appointment in our office and any transportation needs.   Do not put a pillow under the knee. Place it under the heel.  As directed    Do not sit on low chairs, stoools or toilet seats, as it may be difficult to get up from low surfaces  As directed    Driving restrictions  As directed    Comments:     No driving until released by the physician.   Increase activity slowly as tolerated  As directed    Lifting restrictions  As directed    Comments:     No lifting until  released by the physician.   Patient may shower  As directed    Comments:     You may shower without a dressing once there is no drainage.  Do not wash over the wound.  If drainage remains, do not shower until drainage stops.   TED hose  As directed    Comments:     Use stockings (TED hose) for 3 weeks on both leg(s).  You may remove them at night for sleeping.   Weight bearing as tolerated  As directed    Questions:     Laterality:     Extremity:         Medication List    STOP taking these  medications       aspirin 81 MG tablet     multivitamin with minerals tablet      TAKE these medications       acetaminophen 325 MG tablet  Commonly known as:  TYLENOL  Take 2 tablets (650 mg total) by mouth every 6 (six) hours as needed for mild pain (or Fever >/= 101).     bisacodyl 10 MG suppository  Commonly known as:  DULCOLAX  Place 1 suppository (10 mg total) rectally daily as needed for moderate constipation.     DSS 100 MG Caps  Take 100 mg by mouth 2 (two) times daily.     HYDROmorphone 2 MG tablet  Commonly known as:  DILAUDID  Take 1-2 tablets (2-4 mg total) by mouth every 3 (three) hours as needed for moderate pain or severe pain.     methocarbamol 500 MG tablet  Commonly known as:  ROBAXIN  Take 1 tablet (500 mg total) by mouth every 6 (six) hours as needed for muscle spasms.     metoCLOPramide 5 MG tablet  Commonly known as:  REGLAN  Take 1-2 tablets (5-10 mg total) by mouth every 8 (eight) hours as needed for nausea (if ondansetron (ZOFRAN) ineffective.).     ondansetron 4 MG tablet  Commonly known as:  ZOFRAN  Take 1 tablet (4 mg total) by mouth every 6 (six) hours as needed for nausea.     polyethylene glycol packet  Commonly known as:  MIRALAX / GLYCOLAX  Take 17 g by mouth daily as needed for mild constipation.     potassium chloride SA 20 MEQ tablet  Commonly known as:  K-DUR,KLOR-CON  Take 20 mEq by mouth daily.     rivaroxaban 10 MG Tabs tablet  Commonly known as:  XARELTO  - Take 1 tablet (10 mg total) by mouth daily with breakfast. Take Xarelto for two and a half more weeks, then discontinue Xarelto.  - Once the patient has completed the Xarelto, they may resume the 81 mg Aspirin.     traMADol 50 MG tablet  Commonly known as:  ULTRAM  Take 1-2 tablets (50-100 mg total) by mouth every 6 (six) hours as needed (mild to moderate pain).     valsartan-hydrochlorothiazide 160-12.5 MG per tablet  Commonly known as:  DIOVAN-HCT  Take 1 tablet by  mouth at bedtime.       Follow-up Information   Follow up with [name], MD. Schedule an appointment as soon as possible for a visit on 02/23/2014.   Specialty:  Orthopedic Surgery   Contact information:   [address] [place] [address] [phone]       Signed: Mickel Crow 02/12/2014, 7:50 AM

## 2014-02-12 NOTE — Progress Notes (Addendum)
I spoke with [name], CM [phone])  with [name] and she has negotiated a SNF rate with [facility] for admission today. I have contacted SW and she is aware and will speak with pt. I will withdraw request for an inpt rehab admission and sign off. Please call me for any questions. [phone]

## 2014-02-12 NOTE — Progress Notes (Signed)
   Subjective: 4 Days Post-Op Procedure(s) (LRB): LEFT TOTAL KNEE ARTHROPLASTY (Left) Patient reports pain as mild.   Patient seen in rounds for Dr. Wynelle Link.  She is sitting up in bed eating breakfast this morning.  Pain is better today.  Patient waiting on insurance approval for CIR.  Did not receive approval yesterday so she stayed in the hospital through the night.  Social worker is working on other options since [facility] may not come through.  Possible VA benefits and bed at [facility]. Hopefully, the patient  willhear about one of the two options today.  Will setup transfer today in anticipation of approval on one of the beds. Patient is well, but has had some minor complaints of pain in the left knee, requiring pain medications Patient is ready to go to inpatient rehab for continue total knee protocol care.  Objective: Vital signs in last 24 hours: Temp:  [98 F (36.7 C)-99.1 F (37.3 C)] 98 F (36.7 C) (03/27 0604) Pulse Rate:  [69-90] 69 (03/27 0604) Resp:  [16] 16 (03/27 0604) BP: (109-135)/(68-75) 109/68 mmHg (03/27 0604) SpO2:  [95 %-98 %] 98 % (03/27 0604)  Intake/Output from previous day:  Intake/Output Summary (Last 24 hours) at 02/12/14 0743 Last data filed at 02/11/14 2212  Gross per 24 hour  Intake    540 ml  Output      0 ml  Net    540 ml    Labs:  Recent Labs  02/10/14 0354 02/11/14 0408  HGB 9.6* 9.3*    Recent Labs  02/10/14 0354 02/11/14 0408  WBC 10.1 11.0*  RBC 3.21* 3.13*  HCT 29.1* 28.5*  PLT 248 241    Recent Labs  02/10/14 0354  NA 137  K 3.9  CL 100  CO2 28  BUN 12  CREATININE 0.65  GLUCOSE 125*  CALCIUM 9.7   No results found for this basename: LABPT, INR,  in the last 72 hours  EXAM: General - Patient is Alert, Appropriate and Oriented Extremity - Neurovascular intact Sensation intact distally Dorsiflexion/Plantar flexion intact some ecchymosis on posterior knee and expect for it to track further down leg Incision -  clean, dry, no drainage, healing Motor Function - intact, moving foot and toes well on exam.   Assessment/Plan: 4 Days Post-Op Procedure(s) (LRB): LEFT TOTAL KNEE ARTHROPLASTY (Left) Procedure(s) (LRB): LEFT TOTAL KNEE ARTHROPLASTY (Left) Past Medical History  Diagnosis Date  . HTN (hypertension)   . Vitamin D deficiency   . Rheumatoid arthritis(714.0)   . Migraines   . Vertigo   . Osteopenia   . PONV (postoperative nausea and vomiting)     also states "SLOW TO WAKE UP"  . History of blood transfusion   . Sleep apnea     hasnt  used CPAP in years  . Carotid artery aneurysm 2005   Principal Problem:   OA (osteoarthritis) of knee Active Problems:   Postoperative anemia due to acute blood loss  Estimated body mass index is 34.55 kg/(m^2) as calculated from the following:   Height as of this encounter: 5\' 3"  (1.6 m).   Weight as of this encounter: 88.45 kg (195 lb). Up with therapy Discharge to CIR or possible [place] today. Diet - Cardiac diet Follow up - in 2 weeks Activity - WBAT Disposition - Rehab Condition Upon Discharge - Good D/C Meds - See DC Summary DVT Prophylaxis - Xarelto  PERKINS, ALEXZANDREW 02/12/2014, 7:43 AM

## 2014-02-15 ENCOUNTER — Encounter: Payer: Self-pay | Admitting: *Deleted

## 2014-02-19 ENCOUNTER — Telehealth (HOSPITAL_COMMUNITY): Payer: Self-pay | Admitting: Interventional Radiology

## 2014-02-19 ENCOUNTER — Non-Acute Institutional Stay (SKILLED_NURSING_FACILITY): Payer: Federal, State, Local not specified - PPO | Admitting: Internal Medicine

## 2014-02-19 DIAGNOSIS — M25469 Effusion, unspecified knee: Secondary | ICD-10-CM

## 2014-02-19 DIAGNOSIS — Z96659 Presence of unspecified artificial knee joint: Secondary | ICD-10-CM

## 2014-02-19 DIAGNOSIS — I10 Essential (primary) hypertension: Secondary | ICD-10-CM

## 2014-02-19 DIAGNOSIS — D62 Acute posthemorrhagic anemia: Secondary | ICD-10-CM

## 2014-02-19 NOTE — Telephone Encounter (Signed)
Returned pt's call concerning her recent MRI and her brother's recent cath angio. Left her a VM that we would do a f/u angio in 1 yr for Ovid Curd (brother) and she would need US carotids in 6 months time. Told her to call me back with any questions. JM

## 2014-02-20 ENCOUNTER — Encounter: Payer: Self-pay | Admitting: Internal Medicine

## 2014-02-20 DIAGNOSIS — Z96659 Presence of unspecified artificial knee joint: Secondary | ICD-10-CM | POA: Insufficient documentation

## 2014-02-20 NOTE — Assessment & Plan Note (Addendum)
2/2 endstage OA; dilaudid, tramadol, robaxin and xarelto for 2 1/2 weeks, then ASA 81 mg again

## 2014-02-20 NOTE — Assessment & Plan Note (Signed)
Continue diovan

## 2014-02-20 NOTE — Progress Notes (Signed)
MRN: [id number] Name: Joanna Lee  Sex: female Age: 63 y.o. DOB: 01-27-1951  [place] #: [facility] Facility/Room: 1202 Level Of Care: SNF Provider: Inocencio Homes D Emergency Contacts: Extended Emergency Contact Information Primary Emergency Contact: Hayes,Cynthia Address: [place],  [address] [place] Phone: [phone] Mobile Phone: [phone] Relation: Sister  Code Status: FULL  Allergies: Codeine  Chief Complaint  Patient presents with  . nursing home admission    HPI: Patient is 63 y.o. female who is s/p l knee arthroplasty who is admitted for OT/PT.  Past Medical History  Diagnosis Date  . HTN (hypertension)   . Vitamin D deficiency   . Rheumatoid arthritis(714.0)   . Migraines   . Vertigo   . Osteopenia   . PONV (postoperative nausea and vomiting)     also states "SLOW TO WAKE UP"  . History of blood transfusion   . Sleep apnea     hasnt  used CPAP in years  . Carotid artery aneurysm 2005  . Anemia     Past Surgical History  Procedure Laterality Date  . Shoulder surgery  1996    Left  . Coiling of carotid aneurysm  2005    CAROTID  . Knee surgery  2000/2009    Bilateral/ arthroscopy  . Abdominal hysterectomy    . Total knee arthroplasty Left 02/08/2014    Procedure: LEFT TOTAL KNEE ARTHROPLASTY;  Surgeon: [name], MD;  Location: [facility];  Service: Orthopedics;  Laterality: Left;      Medication List       This list is accurate as of: 02/19/14 11:59 PM.  Always use your most recent med list.               acetaminophen 325 MG tablet  Commonly known as:  TYLENOL  Take 2 tablets (650 mg total) by mouth every 6 (six) hours as needed for mild pain (or Fever >/= 101).     bisacodyl 10 MG suppository  Commonly known as:  DULCOLAX  Place 1 suppository (10 mg total) rectally daily as needed for moderate constipation.     DSS 100 MG Caps  Take 100 mg by mouth 2 (two) times daily.     HYDROmorphone 2  MG tablet  Commonly known as:  DILAUDID  Take 1-2 tablets (2-4 mg total) by mouth every 3 (three) hours as needed for moderate pain or severe pain.     methocarbamol 500 MG tablet  Commonly known as:  ROBAXIN  Take 1 tablet (500 mg total) by mouth every 6 (six) hours as needed for muscle spasms.     metoCLOPramide 5 MG tablet  Commonly known as:  REGLAN  Take 1-2 tablets (5-10 mg total) by mouth every 8 (eight) hours as needed for nausea (if ondansetron (ZOFRAN) ineffective.).     ondansetron 4 MG tablet  Commonly known as:  ZOFRAN  Take 1 tablet (4 mg total) by mouth every 6 (six) hours as needed for nausea.     polyethylene glycol packet  Commonly known as:  MIRALAX / GLYCOLAX  Take 17 g by mouth daily as needed for mild constipation.     potassium chloride SA 20 MEQ tablet  Commonly known as:  K-DUR,KLOR-CON  Take 20 mEq by mouth daily.     rivaroxaban 10 MG Tabs tablet  Commonly known as:  XARELTO  - Take 1 tablet (10 mg total) by mouth daily with  breakfast. Take Xarelto for two and a half more weeks, then discontinue Xarelto.  - Once the patient has completed the Xarelto, they may resume the 81 mg Aspirin.     traMADol 50 MG tablet  Commonly known as:  ULTRAM  Take 1-2 tablets (50-100 mg total) by mouth every 6 (six) hours as needed (mild to moderate pain).     valsartan-hydrochlorothiazide 160-12.5 MG per tablet  Commonly known as:  DIOVAN-HCT  Take 1 tablet by mouth at bedtime.        No orders of the defined types were placed in this encounter.     There is no immunization history on file for this patient.  History  Substance Use Topics  . Smoking status: Never Smoker   . Smokeless tobacco: Never Used  . Alcohol Use: 0.6 oz/week    1 Glasses of wine per week     Comment: weekly    Family history is noncontributory    Review of Systems  DATA OBTAINED: from patient GENERAL: Feels well no fevers, fatigue, appetite changes SKIN: No itching, rash or  wounds EYES: No eye pain, redness, discharge EARS: No earache, tinnitus, change in hearing NOSE: No congestion, drainage or bleeding  MOUTH/THROAT: No mouth or tooth pain, No sore throat, No difficulty chewing or swallowing  RESPIRATORY: No cough, wheezing, SOB CARDIAC: No chest pain, palpitations, lower extremity edema  GI: No abdominal pain, No N/V/D or constipation, No heartburn or reflux  GU: No dysuria, frequency or urgency, or incontinence  MUSCULOSKELETAL: feels a lump in incision L knee NEUROLOGIC: No headache, dizziness or focal weakness PSYCHIATRIC: No overt anxiety or sadness. Sleeps well. No behavior issue.   Filed Vitals:   02/20/14 1342  BP: 119/76  Pulse: 78  Temp: 99.1 F (37.3 C)  Resp: 14    Physical Exam  GENERAL APPEARANCE: Alert, conversant. Appropriately groomed. No acute distress.  SKIN: No diaphoresis rash HEAD: Normocephalic, atraumatic  EYES: Conjunctiva/lids clear. Pupils round, reactive. EOMs intact.  EARS: External exam WNL, canals clear. Hearing grossly normal.  NOSE: No deformity or discharge.  MOUTH/THROAT: Lips w/o lesions  RESPIRATORY: Breathing is even, unlabored. Lung sounds are clear   CARDIOVASCULAR: Heart RRR no murmurs, rubs or gallops. No peripheral edema GASTROINTESTINAL: Abdomen is soft, non-tender, not distended w/ normal bowel sounds GENITOURINARY: Bladder non tender, not distended  MUSCULOSKELETAL: LLE- L knee without lumps, small collection fluid over the knee, fluid wave appreciated, no heat, redness or tenderness NEUROLOGIC: Oriented X3. Cranial nerves 2-12 grossly intact. Moves all extremities no tremor. PSYCHIATRIC: Mood and affect appropriate to situation, no behavioral issues  Patient Active Problem List   Diagnosis Date Noted  . S/P total knee arthroplasty 02/20/2014  . Postoperative anemia due to acute blood loss 02/09/2014  . OA (osteoarthritis) of knee 02/08/2014  . Chronic cough 02/05/2012  . Nonruptured cerebral  aneurysm, internal carotid artery, intracranial portion 12/18/2011  . Rheumatoid arthritis(714.0) 12/10/2011  . Dizziness 11/29/2011  . HTN (hypertension) 11/29/2011  . Dyspnea 11/29/2011    CBC    Component Value Date/Time   WBC 11.0* 02/11/2014 0408   RBC 3.13* 02/11/2014 0408   HGB 9.3* 02/11/2014 0408   HCT 28.5* 02/11/2014 0408   PLT 241 02/11/2014 0408   MCV 91.1 02/11/2014 0408   LYMPHSABS 2.8 05/28/2008 0200   MONOABS 0.5 05/28/2008 0200   EOSABS 0.2 05/28/2008 0200   BASOSABS 0.0 05/28/2008 0200    CMP     Component Value Date/Time   NA  137 02/10/2014 0354   K 3.9 02/10/2014 0354   CL 100 02/10/2014 0354   CO2 28 02/10/2014 0354   GLUCOSE 125* 02/10/2014 0354   BUN 12 02/10/2014 0354   CREATININE 0.65 02/10/2014 0354   CALCIUM 9.7 02/10/2014 0354   PROT 7.6 02/02/2014 1220   ALBUMIN 3.6 02/02/2014 1220   AST 17 02/02/2014 1220   ALT 18 02/02/2014 1220   ALKPHOS 84 02/02/2014 1220   BILITOT 0.3 02/02/2014 1220   GFRNONAA >90 02/10/2014 0354   GFRAA >90 02/10/2014 0354    Assessment and Plan  S/P total knee arthroplasty 2/2 endstage OA; dilaudid, tramadol, robaxin and xarelto for 2 1/2 weeks, then ASA 81 mg again  Postoperative anemia due to acute blood loss HB dropped from 12.5 to 9.3; no intervention required  HTN (hypertension) Continue diovan  L KNEE FLUID - [place] ordered; result - neg  Hennie Duos, MD

## 2014-02-20 NOTE — Assessment & Plan Note (Signed)
HB dropped from 12.5 to 9.3; no intervention required

## 2014-03-02 ENCOUNTER — Encounter: Payer: Self-pay | Admitting: Adult Health

## 2014-03-02 ENCOUNTER — Non-Acute Institutional Stay (SKILLED_NURSING_FACILITY): Payer: Federal, State, Local not specified - PPO | Admitting: Adult Health

## 2014-03-02 DIAGNOSIS — IMO0002 Reserved for concepts with insufficient information to code with codable children: Secondary | ICD-10-CM

## 2014-03-02 DIAGNOSIS — Z96659 Presence of unspecified artificial knee joint: Secondary | ICD-10-CM

## 2014-03-02 DIAGNOSIS — M179 Osteoarthritis of knee, unspecified: Secondary | ICD-10-CM

## 2014-03-02 DIAGNOSIS — D62 Acute posthemorrhagic anemia: Secondary | ICD-10-CM

## 2014-03-02 DIAGNOSIS — J309 Allergic rhinitis, unspecified: Secondary | ICD-10-CM

## 2014-03-02 DIAGNOSIS — I10 Essential (primary) hypertension: Secondary | ICD-10-CM

## 2014-03-02 DIAGNOSIS — M1710 Unilateral primary osteoarthritis, unspecified knee: Secondary | ICD-10-CM

## 2014-03-02 NOTE — Progress Notes (Signed)
Patient ID: Joanna Lee, female   DOB: Oct 04, 1951, 63 y.o.   MRN: [id number]              PROGRESS NOTE  DATE: 03/02/2014   FACILITY: [facility]  LEVEL OF CARE: SNF (31)  Acute Visit  CHIEF COMPLAINT:  Discharge Notes  HISTORY OF PRESENT ILLNESS: This is a 63 year old female who is for discharge home with Home health PT and OT. DME: Single point cane. She has been admitted to [facility] on 02/12/14 from [facility] with Osteoarthritis S/P left total knee arthroplasty. Patient was admitted to this facility for short-term rehabilitation after the patient's recent hospitalization.  Patient has completed SNF rehabilitation and therapy has cleared the patient for discharge.  Reassessment of ongoing problem(s):  HTN: Pt 's HTN remains stable.  Denies CP, sob, DOE, pedal edema, headaches, dizziness or visual disturbances.  No complications from the medications currently being used.  Last BP : 124/66  ANEMIA: The anemia has been stable. The patient denies fatigue, melena or hematochezia.  3/15 hgb 11.0  ALLERGIC RHINITIS: Allergic rhinitis remains stable.  Patient denies ongoing symptoms such as runny nose sneezing or tearing. No complications reported from the current medication(s) being used.  PAST MEDICAL HISTORY : Reviewed.  No changes.  CURRENT MEDICATIONS: Reviewed per [facility]  REVIEW OF SYSTEMS:  GENERAL: no change in appetite, no fatigue, no weight changes, no fever, chills or weakness RESPIRATORY: no cough, SOB, DOE, wheezing, hemoptysis CARDIAC: no chest pain, edema or palpitations GI: no abdominal pain, diarrhea, constipation, heart burn, nausea or vomiting  PHYSICAL EXAMINATION  GENERAL: no acute distress, normal body habitus EYES: conjunctivae normal, sclerae normal, normal eye lids NECK: supple, trachea midline, no neck masses, no thyroid tenderness, no thyromegaly LYMPHATICS: no LAN in the neck, no supraclavicular LAN RESPIRATORY: breathing is  even & unlabored, BS CTAB CARDIAC: RRR, no murmur,no extra heart sounds, no edema GI: abdomen soft, normal BS, no masses, no tenderness, no hepatomegaly, no splenomegaly EXTREMITIES: able to ambulate with cane PSYCHIATRIC: the patient is alert & oriented to person, affect & behavior appropriate  LABS/RADIOLOGY: Labs reviewed: Basic Metabolic Panel:  Recent Labs  02/02/14 1220 02/09/14 0400 02/10/14 0354  NA 140 139 137  K 3.5* 3.8 3.9  CL 100 104 100  CO2 29 24 28   GLUCOSE 116* 149* 125*  BUN 11 11 12   CREATININE 0.66 0.58 0.65  CALCIUM 10.6* 9.2 9.7   Liver Function Tests:  Recent Labs  02/02/14 1220  AST 17  ALT 18  ALKPHOS 84  BILITOT 0.3  PROT 7.6  ALBUMIN 3.6   CBC:  Recent Labs  02/09/14 0400 02/10/14 0354 02/11/14 0408  WBC 9.5 10.1 11.0*  HGB 9.5* 9.6* 9.3*  HCT 27.9* 29.1* 28.5*  MCV 90.3 90.7 91.1  PLT 235 248 241     ASSESSMENT/PLAN:  Osteoarthritis status post left total knee arthroplasty - for home health PT and OT Allergic rhinitis - continue Singulair Hypertension - continue Hyzaar Anemia - stable    I have filled out patient's discharge paperwork and written prescriptions.  Patient will receive home health PT and OT> DME provided: Single point cane  Total discharge time: Greater than 30 minutes Discharge time involved coordination of the discharge process with social worker, nursing staff and therapy department. Medical justification for home health services/DME verified.  CPT CODE: 58527  Seth Bake - NP [facility] [phone]

## 2014-07-20 ENCOUNTER — Other Ambulatory Visit: Payer: Self-pay | Admitting: Radiology

## 2014-07-20 DIAGNOSIS — I6529 Occlusion and stenosis of unspecified carotid artery: Secondary | ICD-10-CM

## 2014-07-28 ENCOUNTER — Ambulatory Visit (HOSPITAL_COMMUNITY)
Admission: RE | Admit: 2014-07-28 | Discharge: 2014-07-28 | Disposition: A | Discharge status: Home / Self Care | Payer: Federal, State, Local not specified - PPO | Source: Ambulatory Visit | Attending: Vascular Surgery | Admitting: Vascular Surgery

## 2014-07-28 DIAGNOSIS — I6529 Occlusion and stenosis of unspecified carotid artery: Secondary | ICD-10-CM

## 2014-07-28 NOTE — Progress Notes (Signed)
*  PRELIMINARY RESULTS* Vascular Ultrasound Carotid Duplex (Doppler) has been completed. Findings suggest 1-39% internal carotid artery stenosis bilaterally. Vertebral arteries are patent with antegrade flow.  07/28/2014 2:57 PM Maudry Mayhew, RVT, RDCS, RDMS

## 2014-08-12 ENCOUNTER — Telehealth (HOSPITAL_COMMUNITY): Payer: Self-pay | Admitting: Interventional Radiology

## 2014-08-12 NOTE — Telephone Encounter (Signed)
Called pt, left VM that she would need f/u US in 6 months time. JM

## 2014-11-03 ENCOUNTER — Emergency Department (HOSPITAL_COMMUNITY): Payer: Federal, State, Local not specified - PPO

## 2014-11-03 ENCOUNTER — Encounter (HOSPITAL_COMMUNITY): Payer: Self-pay | Admitting: *Deleted

## 2014-11-03 ENCOUNTER — Emergency Department (HOSPITAL_COMMUNITY)
Admission: EM | Admit: 2014-11-03 | Discharge: 2014-11-04 | Disposition: A | Discharge status: Home / Self Care | Payer: Federal, State, Local not specified - PPO | Attending: Emergency Medicine | Admitting: Emergency Medicine

## 2014-11-03 DIAGNOSIS — E876 Hypokalemia: Secondary | ICD-10-CM | POA: Insufficient documentation

## 2014-11-03 DIAGNOSIS — D649 Anemia, unspecified: Secondary | ICD-10-CM | POA: Diagnosis not present

## 2014-11-03 DIAGNOSIS — R42 Dizziness and giddiness: Secondary | ICD-10-CM | POA: Diagnosis present

## 2014-11-03 DIAGNOSIS — Z9981 Dependence on supplemental oxygen: Secondary | ICD-10-CM | POA: Diagnosis not present

## 2014-11-03 DIAGNOSIS — Z9889 Other specified postprocedural states: Secondary | ICD-10-CM | POA: Diagnosis not present

## 2014-11-03 DIAGNOSIS — E559 Vitamin D deficiency, unspecified: Secondary | ICD-10-CM | POA: Insufficient documentation

## 2014-11-03 DIAGNOSIS — R112 Nausea with vomiting, unspecified: Secondary | ICD-10-CM | POA: Insufficient documentation

## 2014-11-03 DIAGNOSIS — I10 Essential (primary) hypertension: Secondary | ICD-10-CM | POA: Diagnosis not present

## 2014-11-03 DIAGNOSIS — Z7901 Long term (current) use of anticoagulants: Secondary | ICD-10-CM | POA: Insufficient documentation

## 2014-11-03 DIAGNOSIS — R55 Syncope and collapse: Secondary | ICD-10-CM | POA: Diagnosis not present

## 2014-11-03 DIAGNOSIS — G4730 Sleep apnea, unspecified: Secondary | ICD-10-CM | POA: Diagnosis not present

## 2014-11-03 DIAGNOSIS — M069 Rheumatoid arthritis, unspecified: Secondary | ICD-10-CM | POA: Insufficient documentation

## 2014-11-03 DIAGNOSIS — G43909 Migraine, unspecified, not intractable, without status migrainosus: Secondary | ICD-10-CM | POA: Diagnosis not present

## 2014-11-03 DIAGNOSIS — M8580 Other specified disorders of bone density and structure, unspecified site: Secondary | ICD-10-CM | POA: Diagnosis not present

## 2014-11-03 DIAGNOSIS — Z7982 Long term (current) use of aspirin: Secondary | ICD-10-CM | POA: Diagnosis not present

## 2014-11-03 DIAGNOSIS — R569 Unspecified convulsions: Secondary | ICD-10-CM | POA: Insufficient documentation

## 2014-11-03 LAB — URINALYSIS, ROUTINE W REFLEX MICROSCOPIC
Glucose, UA: NEGATIVE mg/dL
Hgb urine dipstick: NEGATIVE
Ketones, ur: NEGATIVE mg/dL
LEUKOCYTES UA: NEGATIVE
NITRITE: NEGATIVE
PH: 5 (ref 5.0–8.0)
PROTEIN: NEGATIVE mg/dL
Specific Gravity, Urine: 1.029 (ref 1.005–1.030)
Urobilinogen, UA: 0.2 mg/dL (ref 0.0–1.0)

## 2014-11-03 LAB — COMPREHENSIVE METABOLIC PANEL
ALK PHOS: 87 U/L (ref 39–117)
ALT: 21 U/L (ref 0–35)
ANION GAP: 13 (ref 5–15)
AST: 20 U/L (ref 0–37)
Albumin: 3.9 g/dL (ref 3.5–5.2)
BUN: 10 mg/dL (ref 6–23)
CALCIUM: 10.6 mg/dL — AB (ref 8.4–10.5)
CO2: 28 meq/L (ref 19–32)
Chloride: 97 mEq/L (ref 96–112)
Creatinine, Ser: 0.7 mg/dL (ref 0.50–1.10)
GFR calc Af Amer: >90 mL/min (ref >90)
GFR calc non Af Amer: 90 mL/min — ABNORMAL LOW (ref >90)
Glucose, Bld: 113 mg/dL — ABNORMAL HIGH (ref 70–99)
Potassium: 2.8 mEq/L — CL (ref 3.7–5.3)
SODIUM: 138 meq/L (ref 137–147)
TOTAL PROTEIN: 7.8 g/dL (ref 6.0–8.3)
Total Bilirubin: 0.3 mg/dL (ref 0.3–1.2)

## 2014-11-03 LAB — CBC WITH DIFFERENTIAL/PLATELET
BASOS ABS: 0 10*3/uL (ref 0.0–0.1)
Basophils Relative: 0 % (ref 0–1)
EOS ABS: 0.1 10*3/uL (ref 0.0–0.7)
Eosinophils Relative: 1 % (ref 0–5)
HCT: 39.5 % (ref 36.0–46.0)
Hemoglobin: 12.8 g/dL (ref 12.0–15.0)
LYMPHS ABS: 2.5 10*3/uL (ref 0.7–4.0)
LYMPHS PCT: 23 % (ref 12–46)
MCH: 29.2 pg (ref 26.0–34.0)
MCHC: 32.4 g/dL (ref 30.0–36.0)
MCV: 90.2 fL (ref 78.0–100.0)
Monocytes Absolute: 0.6 10*3/uL (ref 0.1–1.0)
Monocytes Relative: 5 % (ref 3–12)
Neutro Abs: 7.5 10*3/uL (ref 1.7–7.7)
Neutrophils Relative %: 71 % (ref 43–77)
PLATELETS: 308 10*3/uL (ref 150–400)
RBC: 4.38 MIL/uL (ref 3.87–5.11)
RDW: 13.6 % (ref 11.5–15.5)
WBC: 10.6 10*3/uL — AB (ref 4.0–10.5)

## 2014-11-03 LAB — I-STAT TROPONIN, ED: Troponin i, poc: 0 ng/mL (ref 0.00–0.08)

## 2014-11-03 MED ORDER — SODIUM CHLORIDE 0.9 % IV BOLUS (SEPSIS)
1000.0000 mL | Freq: Once | INTRAVENOUS | Status: AC
  Administered 2014-11-03: 1000 mL via INTRAVENOUS

## 2014-11-03 MED ORDER — POTASSIUM CHLORIDE CRYS ER 20 MEQ PO TBCR
40.0000 meq | EXTENDED_RELEASE_TABLET | Freq: Once | ORAL | Status: AC
  Administered 2014-11-03: 40 meq via ORAL
  Filled 2014-11-03: qty 2

## 2014-11-03 MED ORDER — ONDANSETRON HCL 4 MG/2ML IJ SOLN
4.0000 mg | Freq: Once | INTRAMUSCULAR | Status: AC
  Administered 2014-11-03: 4 mg via INTRAVENOUS
  Filled 2014-11-03: qty 2

## 2014-11-03 NOTE — Discharge Instructions (Signed)
Hypokalemia Hypokalemia means that the amount of potassium in the blood is lower than normal.Potassium is a chemical, called an electrolyte, that helps regulate the amount of fluid in the body. It also stimulates muscle contraction and helps nerves function properly.Most of the body's potassium is inside of cells, and only a very small amount is in the blood. Because the amount in the blood is so small, minor changes can be life-threatening. CAUSES  Antibiotics.  Diarrhea or vomiting.  Using laxatives too much, which can cause diarrhea.  Chronic kidney disease.  Water pills (diuretics).  Eating disorders (bulimia).  Low magnesium level.  Sweating a lot. SIGNS AND SYMPTOMS  Weakness.  Constipation.  Fatigue.  Muscle cramps.  Mental confusion.  Skipped heartbeats or irregular heartbeat (palpitations).  Tingling or numbness. DIAGNOSIS  Your health care provider can diagnose hypokalemia with blood tests. In addition to checking your potassium level, your health care provider may also check other lab tests. TREATMENT Hypokalemia can be treated with potassium supplements taken by mouth or adjustments in your current medicines. If your potassium level is very low, you may need to get potassium through a vein (IV) and be monitored in the hospital. A diet high in potassium is also helpful. Foods high in potassium are:  Nuts, such as peanuts and pistachios.  Seeds, such as sunflower seeds and pumpkin seeds.  Peas, lentils, and lima beans.  Whole grain and bran cereals and breads.  Fresh fruit and vegetables, such as apricots, avocado, bananas, cantaloupe, kiwi, oranges, tomatoes, asparagus, and potatoes.  Orange and tomato juices.  Red meats.  Fruit yogurt. HOME CARE INSTRUCTIONS  Take all medicines as prescribed by your health care provider.  Maintain a healthy diet by including nutritious food, such as fruits, vegetables, nuts, whole grains, and lean meats.  If  you are taking a laxative, be sure to follow the directions on the label. SEEK MEDICAL CARE IF:  Your weakness gets worse.  You feel your heart pounding or racing.  You are vomiting or having diarrhea.  You are diabetic and having trouble keeping your blood glucose in the normal range. SEEK IMMEDIATE MEDICAL CARE IF:  You have chest pain, shortness of breath, or dizziness.  You are vomiting or having diarrhea for more than 2 days.  You faint. MAKE SURE YOU:   Understand these instructions.  Will watch your condition.  Will get help right away if you are not doing well or get worse. Document Released: 11/05/2005 Document Revised: 08/26/2013 Document Reviewed: 05/08/2013 [facility] Patient Information 2015 [place], [place]. This information is not intended to replace advice given to you by your health care provider. Make sure you discuss any questions you have with your health care provider.  Near-Syncope Near-syncope (commonly known as near fainting) is sudden weakness, dizziness, or feeling like you might pass out. During an episode of near-syncope, you may also develop pale skin, have tunnel vision, or feel sick to your stomach (nauseous). Near-syncope may occur when getting up after sitting or while standing for a long time. It is caused by a sudden decrease in blood flow to the brain. This decrease can result from various causes or triggers, most of which are not serious. However, because near-syncope can sometimes be a sign of something serious, a medical evaluation is required. The specific cause is often not determined. HOME CARE INSTRUCTIONS  Monitor your condition for any changes. The following actions may help to alleviate any discomfort you are experiencing:  Have someone stay with you  until you feel stable.  Lie down right away and prop your feet up if you start feeling like you might faint. Breathe deeply and steadily. Wait until all the symptoms have passed. Most of these  episodes last only a few minutes. You may feel tired for several hours.   Drink enough fluids to keep your urine clear or pale yellow.   If you are taking blood pressure or heart medicine, get up slowly when seated or lying down. Take several minutes to sit and then stand. This can reduce dizziness.  Follow up with your health care provider as directed. SEEK IMMEDIATE MEDICAL CARE IF:   You have a severe headache.   You have unusual pain in the chest, abdomen, or back.   You are bleeding from the mouth or rectum, or you have black or tarry stool.   You have an irregular or very fast heartbeat.   You have repeated fainting or have seizure-like jerking during an episode.   You faint when sitting or lying down.   You have confusion.   You have difficulty walking.   You have severe weakness.   You have vision problems.  MAKE SURE YOU:   Understand these instructions.  Will watch your condition.  Will get help right away if you are not doing well or get worse. Document Released: 11/05/2005 Document Revised: 11/10/2013 Document Reviewed: 04/10/2013 [facility] Patient Information 2015 [place], [place]. This information is not intended to replace advice given to you by your health care provider. Make sure you discuss any questions you have with your health care provider.

## 2014-11-03 NOTE — ED Notes (Signed)
Pt reports dizziness and near syncopal episode today while on the way home.  Pt reports then she became nauseous and started vomited.  Pt reports pending MRI and has had an EEG and is awaiting results.  Pt reports they are r/o seizures.  Pt denies any pain at this time.

## 2014-11-03 NOTE — ED Provider Notes (Signed)
CSN: [id number]     Arrival date & time 11/03/14  1909 History   First MD Initiated Contact with Patient 11/03/14 1952     Chief Complaint  Patient presents with  . Dizziness  . Nausea   HPI  Patient is a 63 year old female who presents emergency room for evaluation after an episode of dizziness and nausea. Patient states that she was on her way home and was going to get something to eat when she developed a dizzy and lightheaded feeling and then got very flushed and developed nausea. She has vomited 2 times and sent. She states that she is had similar episodes to this in the past but not as severe. She did not have any syncope or loss of consciousness. Patient states that she is concerned because she was recently evaluated at the [facility] and was told that she was having right-sided seizures on EEG. Patient states that this was prompted by headaches that were worse at nighttime and laying down. Patient states that she does have a history of a carotid aneurysm is followed by Dr.Devashuar.  She denies any fevers, chills, chest pain, shortness of breath, diarrhea, melena, medications, constipation, or urinary symptoms.   Past Medical History  Diagnosis Date  . HTN (hypertension)   . Vitamin D deficiency   . Rheumatoid arthritis(714.0)   . Migraines   . Vertigo   . Osteopenia   . PONV (postoperative nausea and vomiting)     also states "SLOW TO WAKE UP"  . History of blood transfusion   . Sleep apnea     hasnt  used CPAP in years  . Carotid artery aneurysm 2005  . Anemia    Past Surgical History  Procedure Laterality Date  . Shoulder surgery  1996    Left  . Coiling of carotid aneurysm  2005    CAROTID  . Knee surgery  2000/2009    Bilateral/ arthroscopy  . Abdominal hysterectomy    . Total knee arthroplasty Left 02/08/2014    Procedure: LEFT TOTAL KNEE ARTHROPLASTY;  Surgeon: [name], MD;  Location: [facility];  Service: Orthopedics;  Laterality: Left;   Family History  Problem  Relation Age of Onset  . Lung cancer Father   . Lung cancer Brother   . Heart attack Brother 7  . Colon cancer Brother     x 2  . Colon cancer Sister   . Colon cancer Sister   . Hypertension Other   . Diabetes Other   . Gout Other   . Multiple sclerosis Sister   . Ovarian cancer Sister   . Breast cancer Mother   . Diabetes Mother   . Hypertension Mother   . Asthma Brother    History  Substance Use Topics  . Smoking status: Never Smoker   . Smokeless tobacco: Never Used  . Alcohol Use: 0.6 oz/week    1 Glasses of wine per week     Comment: weekly   OB History    No data available     Review of Systems  Constitutional: Negative for fever, chills and fatigue.  Respiratory: Negative for chest tightness, shortness of breath and wheezing.   Cardiovascular: Negative for chest pain, palpitations and leg swelling.  Gastrointestinal: Positive for nausea and vomiting. Negative for abdominal pain, diarrhea, constipation, blood in stool and anal bleeding.  Genitourinary: Negative for dysuria, urgency, frequency, hematuria and difficulty urinating.  Neurological: Positive for dizziness, seizures (Patient reports staring episodes and blackouts) and headaches. Negative  for tremors, syncope, speech difficulty, weakness and numbness.  Psychiatric/Behavioral: Negative for confusion.      Allergies  Codeine  Home Medications   Prior to Admission medications   Medication Sig Start Date End Date Taking? Authorizing Provider  aspirin 81 MG tablet Take 81 mg by mouth at bedtime.   Yes Historical Provider, MD  cholecalciferol (VITAMIN D) 1000 UNITS tablet Take 1,000 Units by mouth at bedtime.   Yes Historical Provider, MD  folic acid (FOLVITE) 1 MG tablet Take 1 mg by mouth at bedtime.   Yes Historical Provider, MD  methotrexate (RHEUMATREX) 2.5 MG tablet Take 12.5 mg by mouth once a week. Every Friday.   Yes Historical Provider, MD  potassium chloride SA (K-DUR,KLOR-CON) 20 MEQ tablet  Take 20 mEq by mouth daily.   Yes Historical Provider, MD  valsartan-hydrochlorothiazide (DIOVAN-HCT) 160-12.5 MG per tablet Take 1 tablet by mouth at bedtime.   Yes Historical Provider, MD  acetaminophen (TYLENOL) 325 MG tablet Take 2 tablets (650 mg total) by mouth every 6 (six) hours as needed for mild pain (or Fever >/= 101). 02/10/14   Arlee Muslim, PA-C  bisacodyl (DULCOLAX) 10 MG suppository Place 1 suppository (10 mg total) rectally daily as needed for moderate constipation. 02/10/14   Arlee Muslim, PA-C  docusate sodium 100 MG CAPS Take 100 mg by mouth 2 (two) times daily. Patient not taking: Reported on 11/03/2014 02/10/14   Arlee Muslim, PA-C  HYDROmorphone (DILAUDID) 2 MG tablet Take 1-2 tablets (2-4 mg total) by mouth every 3 (three) hours as needed for moderate pain or severe pain. 02/10/14   Arlee Muslim, PA-C  methocarbamol (ROBAXIN) 500 MG tablet Take 1 tablet (500 mg total) by mouth every 6 (six) hours as needed for muscle spasms. 02/10/14   Arlee Muslim, PA-C  metoCLOPramide (REGLAN) 5 MG tablet Take 1-2 tablets (5-10 mg total) by mouth every 8 (eight) hours as needed for nausea (if ondansetron (ZOFRAN) ineffective.). 02/10/14   Arlee Muslim, PA-C  ondansetron (ZOFRAN) 4 MG tablet Take 1 tablet (4 mg total) by mouth every 6 (six) hours as needed for nausea. 02/10/14   Arlee Muslim, PA-C  polyethylene glycol [facility] / GLYCOLAX) packet Take 17 g by mouth daily as needed for mild constipation. 02/10/14   Arlee Muslim, PA-C  rivaroxaban (XARELTO) 10 MG TABS tablet Take 1 tablet (10 mg total) by mouth daily with breakfast. Take Xarelto for two and a half more weeks, then discontinue Xarelto. Once the patient has completed the Xarelto, they may resume the 81 mg Aspirin. Patient not taking: Reported on 11/03/2014 02/10/14   Arlee Muslim, PA-C  traMADol (ULTRAM) 50 MG tablet Take 1-2 tablets (50-100 mg total) by mouth every 6 (six) hours as needed (mild to moderate pain). 02/10/14   Arlee Muslim,  PA-C   BP 158/81 mmHg  Pulse 85  Temp(Src) 98.1 F (36.7 C) (Oral)  Resp 18  SpO2 100% Physical Exam  Constitutional: She is oriented to person, place, and time. She appears well-developed and well-nourished. No distress.  HENT:  Head: Normocephalic and atraumatic.  Mouth/Throat: Oropharynx is clear and moist. No oropharyngeal exudate.  Eyes: Conjunctivae and EOM are normal. Pupils are equal, round, and reactive to light. No scleral icterus.  Neck: Normal range of motion. Neck supple. No JVD present. No thyromegaly present.  No evidence of nuchal rigidity.  Cardiovascular: Normal rate, regular rhythm, normal heart sounds and intact distal pulses.  Exam reveals no gallop and no friction rub.   No murmur  heard. Pulmonary/Chest: Effort normal and breath sounds normal. No respiratory distress. She has no wheezes. She has no rales. She exhibits no tenderness.  Abdominal: Soft. Bowel sounds are normal. She exhibits no distension and no mass. There is no tenderness. There is no rebound and no guarding.  Musculoskeletal: Normal range of motion.  Lymphadenopathy:    She has no cervical adenopathy.  Neurological: She is alert and oriented to person, place, and time. She has normal strength. No cranial nerve deficit or sensory deficit. Coordination normal.  Skin: Skin is warm and dry. She is not diaphoretic.  Psychiatric: She has a normal mood and affect. Her behavior is normal. Judgment and thought content normal.  Nursing note and vitals reviewed.   ED Course  Procedures (including critical care time) Labs Review Labs Reviewed  CBC WITH DIFFERENTIAL - Abnormal; Notable for the following:    WBC 10.6 (*)    All other components within normal limits  COMPREHENSIVE METABOLIC PANEL - Abnormal; Notable for the following:    Potassium 2.8 (*)    Glucose, Bld 113 (*)    Calcium 10.6 (*)    GFR calc non Af Amer 90 (*)    All other components within normal limits  URINALYSIS, ROUTINE W REFLEX  MICROSCOPIC - Abnormal; Notable for the following:    Bilirubin Urine SMALL (*)    All other components within normal limits  I-STAT TROPOININ, ED    Imaging Review Dg Chest 2 View  11/03/2014   CLINICAL DATA:  Near syncope  EXAM: CHEST  2 VIEW  COMPARISON:  04/03/2013  FINDINGS: There is chronic mild cardiomegaly and aortic tortuosity. The hila are negative. There is no edema, consolidation, effusion, or pneumothorax.  IMPRESSION: Chronic mild cardiomegaly.  No acute findings.   Electronically Signed   By: Jorje Guild M.D.   On: 11/03/2014 21:32     EKG Interpretation   Date/Time:  Wednesday November 03 2014 21:03:26 EST Ventricular Rate:  59 PR Interval:  158 QRS Duration: 98 QT Interval:  492 QTC Calculation: 487 R Axis:   24 Text Interpretation:  Sinus rhythm Borderline prolonged QT interval No  significant change since last tracing Confirmed by POLLINA  MD,  CHRISTOPHER [phone]) on 11/03/2014 9:19:58 PM      MDM   Final diagnoses:  Near syncope  Hypokalemia    Patient is a 63 year old female who presents emergency room for evaluation of near syncopal event. Physical exam is unremarkable. EKG is unchanged from prior. Chest x-ray is negative. I-STAT troponin is negative. CBC is unremarkable. CMP reveals hypokalemia. Patient given 40 mEq of potassium here. Patient given 1 L normal saline bolus and Zofran with relief of her nausea and weakness. Suspect that presyncopal event may have been due to likely abnormality. Patient is able to ambulate here with a steady gait. Patient has close follow-up with her PCP. Patient is stable for discharge at this time. I discussed the patient with Dr.Pollina who agrees to the above workup and plan. Patients to for discharge.  Cherylann Parr, PA-C 11/03/14 [id number]  Orpah Greek, MD 11/03/14 [phone]

## 2014-11-22 ENCOUNTER — Telehealth: Payer: Self-pay | Admitting: Neurology

## 2014-11-22 NOTE — Telephone Encounter (Signed)
Pt resch appt from 12-24-14 to 11-23-14

## 2014-11-23 ENCOUNTER — Ambulatory Visit (INDEPENDENT_AMBULATORY_CARE_PROVIDER_SITE_OTHER): Payer: Federal, State, Local not specified - PPO | Admitting: Neurology

## 2014-11-23 ENCOUNTER — Encounter: Payer: Self-pay | Admitting: Neurology

## 2014-11-23 VITALS — BP 132/82 | HR 71 | Resp 16 | Ht 62.0 in | Wt 198.0 lb

## 2014-11-23 DIAGNOSIS — I729 Aneurysm of unspecified site: Secondary | ICD-10-CM

## 2014-11-23 DIAGNOSIS — I720 Aneurysm of carotid artery: Secondary | ICD-10-CM

## 2014-11-23 DIAGNOSIS — R404 Transient alteration of awareness: Secondary | ICD-10-CM
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
## 2014-11-23 NOTE — Patient Instructions (Signed)
1. Schedule MRI brain with and without contrast seizure protocol 2. Schedule 24-hour EEG 3. As per [place] driving laws, for any episode of loss of awareness, one should not drive until 6 months event-free 4. Follow-up after EEG

## 2014-11-24 ENCOUNTER — Telehealth: Payer: Self-pay | Admitting: Family Medicine

## 2014-11-24 NOTE — Telephone Encounter (Signed)
Called patient to let her know that we had to change her MRI Brain appt date due to Dr. Delice Lesch ordering additional testing of MRA head & neck. Appt has now been moved to 12/07/14 @ [facility]. She is to arrive at 1:30.

## 2014-11-26 ENCOUNTER — Encounter: Payer: Self-pay | Admitting: Neurology

## 2014-12-04 ENCOUNTER — Other Ambulatory Visit: Payer: Federal, State, Local not specified - PPO

## 2014-12-07 ENCOUNTER — Ambulatory Visit
Admission: RE | Admit: 2014-12-07 | Discharge: 2014-12-07 | Disposition: A | Discharge status: Home / Self Care | Payer: Federal, State, Local not specified - PPO | Source: Ambulatory Visit | Attending: Neurology | Admitting: Neurology

## 2014-12-07 DIAGNOSIS — I729 Aneurysm of unspecified site: Secondary | ICD-10-CM

## 2014-12-07 DIAGNOSIS — I720 Aneurysm of carotid artery: Secondary | ICD-10-CM

## 2014-12-07 MED ORDER — GADOBENATE DIMEGLUMINE 529 MG/ML IV SOLN
18.0000 mL | Freq: Once | INTRAVENOUS | Status: AC | PRN
  Administered 2014-12-07: 18 mL via INTRAVENOUS

## 2014-12-24 ENCOUNTER — Ambulatory Visit: Payer: Federal, State, Local not specified - PPO | Admitting: Neurology

## 2015-01-05 ENCOUNTER — Ambulatory Visit (INDEPENDENT_AMBULATORY_CARE_PROVIDER_SITE_OTHER): Payer: Federal, State, Local not specified - PPO | Admitting: Neurology

## 2015-01-05 DIAGNOSIS — R404 Transient alteration of awareness: Secondary | ICD-10-CM

## 2015-01-10 ENCOUNTER — Encounter: Payer: Self-pay | Admitting: Neurology

## 2015-01-10 ENCOUNTER — Ambulatory Visit (INDEPENDENT_AMBULATORY_CARE_PROVIDER_SITE_OTHER): Payer: Federal, State, Local not specified - PPO | Admitting: Neurology

## 2015-01-10 VITALS — BP 142/88 | HR 76 | Ht 63.5 in | Wt 200.0 lb

## 2015-01-10 DIAGNOSIS — G43109 Migraine with aura, not intractable, without status migrainosus: Secondary | ICD-10-CM | POA: Diagnosis not present

## 2015-01-10 DIAGNOSIS — R404 Transient alteration of awareness: Secondary | ICD-10-CM | POA: Diagnosis not present

## 2015-01-10 NOTE — Patient Instructions (Signed)
1. Continue to monitor your symptoms, if they change, please call our office 2. Follow-up in 3 months 3. As per [place] driving laws, after any episode of loss of awareness, one should not drive until  6 months event-free
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
## 2015-01-11 ENCOUNTER — Telehealth: Payer: Self-pay | Admitting: Neurology

## 2015-01-11 NOTE — Telephone Encounter (Signed)
Lmom to return my call. 

## 2015-01-11 NOTE — Telephone Encounter (Signed)
Pt wants to talk to Dr Delice Lesch about something that was talked about yesterday when she was here. She states it is about test results. Pt will not be able until around 4 today. Please call [phone] or [phone]

## 2015-01-11 NOTE — Telephone Encounter (Signed)
Spoke to patient. She wanted to add information that she found paperwork dating back to the 1980s where she was having similar symptoms and actually blacked out, and had an EEG with "similar abnormalities." She will bring the papers by for my review. Discussed re-trial of medication for both seizure and headache prophylaxis, she will think about it.

## 2015-01-24 ENCOUNTER — Telehealth: Payer: Self-pay | Admitting: Neurology

## 2015-01-24 NOTE — Telephone Encounter (Signed)
Pt called wanting to know if she could pick up a copy of her EEG report today. Please call pt # [phone]

## 2015-01-25 NOTE — Telephone Encounter (Signed)
Called patient left msg on voicemail that EEG report was ready & also CD of EEG left up front for patient p/u. Did tell patient that she would need to sign a release when she came in to pick up CD.
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
## 2015-02-01 ENCOUNTER — Encounter: Payer: Self-pay | Admitting: Neurology

## 2015-02-11 ENCOUNTER — Other Ambulatory Visit: Payer: Self-pay | Admitting: Radiology

## 2015-02-11 DIAGNOSIS — I6529 Occlusion and stenosis of unspecified carotid artery: Secondary | ICD-10-CM

## 2015-02-28 ENCOUNTER — Ambulatory Visit (HOSPITAL_BASED_OUTPATIENT_CLINIC_OR_DEPARTMENT_OTHER): Payer: Medicare Other | Attending: Cardiology | Admitting: Radiology

## 2015-02-28 VITALS — Ht 63.0 in | Wt 189.0 lb

## 2015-02-28 DIAGNOSIS — G4733 Obstructive sleep apnea (adult) (pediatric): Secondary | ICD-10-CM

## 2015-02-28 DIAGNOSIS — R0683 Snoring: Secondary | ICD-10-CM | POA: Diagnosis not present

## 2015-02-28 DIAGNOSIS — G4710 Hypersomnia, unspecified: Secondary | ICD-10-CM | POA: Diagnosis present

## 2015-03-05 DIAGNOSIS — G4733 Obstructive sleep apnea (adult) (pediatric): Secondary | ICD-10-CM | POA: Diagnosis not present

## 2015-03-05 NOTE — Sleep Study (Signed)
   NAME: Joanna Lee DATE OF BIRTH:  [birth year] MEDICAL RECORD NUMBER [id number]  LOCATION: [facility]  PHYSICIAN: Fardeen Steinberger D  DATE OF STUDY: 02/28/2015  SLEEP STUDY TYPE: Nocturnal Polysomnogram               REFERRING PHYSICIAN: Wallene Huh, MD  INDICATION FOR STUDY: Hypersomnia with sleep apnea  EPWORTH SLEEPINESS SCORE:   3/24  HEIGHT: 5\' 3"  (160 cm)  WEIGHT: 189 lb (85.73 kg)    Body mass index is 33.49 kg/(m^2).  NECK SIZE: 14 in.  MEDICATIONS: Charted for review  SLEEP ARCHITECTURE: Total sleep time 357.5 minutes with sleep efficiency 86.4%. Stage I was 6.3%, stage II 79.9%, stage III absent, REM 13.8% of total sleep time. Sleep latency 6.5 minutes, REM latency 64 minutes, awake after sleep onset 50 minutes, arousal index 17.6, bedtime medication: Aspirin, folic acid, Diovan, vitamin D  RESPIRATORY DATA: Apnea hypopnea index (AHI) 6.7 per hour. 40 total events scored including 19 obstructive apneas and 21 hypopneas. Events in all sleep positions. REM AHI 41.2 per hour. There were not enough early events to permit application of split protocol CPAP titration.  OXYGEN DATA: Moderately loud snoring with oxygen desaturation to a nadir of 81% and mean saturation 96.1% on room air  CARDIAC DATA: Sinus rhythm with PVCs and PACs  MOVEMENT/PARASOMNIA: No significant movement disturbance, bathroom 1  IMPRESSION/ RECOMMENDATION:   1) Mild obstructive sleep apnea/hypopnea syndrome, AHI 6.7 per hour with non-positional events. REM AHI 41.2 per hour. Moderately loud snoring with oxygen desaturation to a nadir of 81% and mean saturation 96.1% on room air. 2) Scores in this range may not require treatment. The upper limit of normal is an AHI of 5 per hour. Where appropriate, weight loss, management of nasal congestion and encouragement to sleep off flat of back may be sufficient.   Deneise Lever Diplomate, American Board of Sleep Medicine  ELECTRONICALLY  SIGNED ON:  03/05/2015, 10:37 AM [place] PH: [phone]   FX: [phone] [place]

## 2015-03-08 ENCOUNTER — Ambulatory Visit (HOSPITAL_COMMUNITY)
Admission: RE | Admit: 2015-03-08 | Discharge: 2015-03-08 | Disposition: A | Discharge status: Home / Self Care | Payer: Medicare Other | Source: Ambulatory Visit | Attending: Radiology | Admitting: Radiology

## 2015-03-08 DIAGNOSIS — I6523 Occlusion and stenosis of bilateral carotid arteries: Secondary | ICD-10-CM | POA: Diagnosis not present

## 2015-03-08 DIAGNOSIS — R42 Dizziness and giddiness: Secondary | ICD-10-CM | POA: Insufficient documentation

## 2015-03-08 DIAGNOSIS — I6529 Occlusion and stenosis of unspecified carotid artery: Secondary | ICD-10-CM | POA: Diagnosis not present

## 2015-03-08 NOTE — Progress Notes (Signed)
VASCULAR LAB PRELIMINARY  PRELIMINARY  PRELIMINARY  PRELIMINARY  Carotid Duplex completed.    Preliminary report:  40-59% stenosis bilaterally.   Vertebral arteries are patent and antegrade.  August Albino, RVT 03/08/2015, 4:22 PM

## 2015-03-09 DIAGNOSIS — I6529 Occlusion and stenosis of unspecified carotid artery: Secondary | ICD-10-CM | POA: Insufficient documentation

## 2015-04-04 DIAGNOSIS — L723 Sebaceous cyst: Secondary | ICD-10-CM | POA: Diagnosis not present

## 2015-04-04 DIAGNOSIS — Z6835 Body mass index (BMI) 35.0-35.9, adult: Secondary | ICD-10-CM | POA: Diagnosis not present

## 2015-04-20 ENCOUNTER — Ambulatory Visit (INDEPENDENT_AMBULATORY_CARE_PROVIDER_SITE_OTHER): Payer: Medicare Other | Admitting: Neurology

## 2015-04-20 ENCOUNTER — Encounter: Payer: Self-pay | Admitting: Neurology

## 2015-04-20 VITALS — BP 120/78 | HR 59 | Resp 16 | Ht 63.5 in | Wt 202.0 lb

## 2015-04-20 DIAGNOSIS — G43109 Migraine with aura, not intractable, without status migrainosus: Secondary | ICD-10-CM | POA: Insufficient documentation

## 2015-04-20 DIAGNOSIS — I720 Aneurysm of carotid artery: Secondary | ICD-10-CM | POA: Diagnosis not present

## 2015-04-20 DIAGNOSIS — I6529 Occlusion and stenosis of unspecified carotid artery: Secondary | ICD-10-CM

## 2015-04-20 DIAGNOSIS — R404 Transient alteration of awareness: Secondary | ICD-10-CM | POA: Insufficient documentation

## 2015-04-20 NOTE — Patient Instructions (Signed)
1. Continue to monitor symptoms, keep a calendar 2. As per [place] driving laws, after an episode of loss of awareness or consciousness, stop driving until 6 months event-free 3. Follow-up in 4-6 months, call our office for any changes
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
## 2015-04-29 DIAGNOSIS — Z6835 Body mass index (BMI) 35.0-35.9, adult: Secondary | ICD-10-CM | POA: Diagnosis not present

## 2015-04-29 DIAGNOSIS — L723 Sebaceous cyst: Secondary | ICD-10-CM | POA: Diagnosis not present

## 2015-04-29 DIAGNOSIS — N390 Urinary tract infection, site not specified: Secondary | ICD-10-CM | POA: Diagnosis not present

## 2015-04-29 DIAGNOSIS — R8290 Unspecified abnormal findings in urine: Secondary | ICD-10-CM | POA: Diagnosis not present

## 2015-04-29 DIAGNOSIS — N898 Other specified noninflammatory disorders of vagina: Secondary | ICD-10-CM | POA: Diagnosis not present

## 2015-05-04 DIAGNOSIS — M9900 Segmental and somatic dysfunction of head region: Secondary | ICD-10-CM | POA: Diagnosis not present

## 2015-05-04 DIAGNOSIS — M9904 Segmental and somatic dysfunction of sacral region: Secondary | ICD-10-CM | POA: Diagnosis not present

## 2015-05-04 DIAGNOSIS — M9901 Segmental and somatic dysfunction of cervical region: Secondary | ICD-10-CM | POA: Diagnosis not present

## 2015-05-04 DIAGNOSIS — G441 Vascular headache, not elsewhere classified: Secondary | ICD-10-CM | POA: Diagnosis not present

## 2015-05-04 DIAGNOSIS — M9902 Segmental and somatic dysfunction of thoracic region: Secondary | ICD-10-CM | POA: Diagnosis not present

## 2015-05-04 DIAGNOSIS — R51 Headache: Secondary | ICD-10-CM | POA: Diagnosis not present

## 2015-05-04 DIAGNOSIS — M542 Cervicalgia: Secondary | ICD-10-CM | POA: Diagnosis not present

## 2015-05-05 DIAGNOSIS — M9900 Segmental and somatic dysfunction of head region: Secondary | ICD-10-CM | POA: Diagnosis not present

## 2015-05-05 DIAGNOSIS — G441 Vascular headache, not elsewhere classified: Secondary | ICD-10-CM | POA: Diagnosis not present

## 2015-05-05 DIAGNOSIS — M9904 Segmental and somatic dysfunction of sacral region: Secondary | ICD-10-CM | POA: Diagnosis not present

## 2015-05-05 DIAGNOSIS — R51 Headache: Secondary | ICD-10-CM | POA: Diagnosis not present

## 2015-05-05 DIAGNOSIS — M9901 Segmental and somatic dysfunction of cervical region: Secondary | ICD-10-CM | POA: Diagnosis not present

## 2015-05-05 DIAGNOSIS — M542 Cervicalgia: Secondary | ICD-10-CM | POA: Diagnosis not present

## 2015-05-05 DIAGNOSIS — M9902 Segmental and somatic dysfunction of thoracic region: Secondary | ICD-10-CM | POA: Diagnosis not present

## 2015-05-09 DIAGNOSIS — G441 Vascular headache, not elsewhere classified: Secondary | ICD-10-CM | POA: Diagnosis not present

## 2015-05-09 DIAGNOSIS — M542 Cervicalgia: Secondary | ICD-10-CM | POA: Diagnosis not present

## 2015-05-09 DIAGNOSIS — M9902 Segmental and somatic dysfunction of thoracic region: Secondary | ICD-10-CM | POA: Diagnosis not present

## 2015-05-09 DIAGNOSIS — M9900 Segmental and somatic dysfunction of head region: Secondary | ICD-10-CM | POA: Diagnosis not present

## 2015-05-09 DIAGNOSIS — R51 Headache: Secondary | ICD-10-CM | POA: Diagnosis not present

## 2015-05-09 DIAGNOSIS — M9901 Segmental and somatic dysfunction of cervical region: Secondary | ICD-10-CM | POA: Diagnosis not present

## 2015-05-09 DIAGNOSIS — M9904 Segmental and somatic dysfunction of sacral region: Secondary | ICD-10-CM | POA: Diagnosis not present

## 2015-05-11 DIAGNOSIS — M9900 Segmental and somatic dysfunction of head region: Secondary | ICD-10-CM | POA: Diagnosis not present

## 2015-05-11 DIAGNOSIS — G441 Vascular headache, not elsewhere classified: Secondary | ICD-10-CM | POA: Diagnosis not present

## 2015-05-11 DIAGNOSIS — R51 Headache: Secondary | ICD-10-CM | POA: Diagnosis not present

## 2015-05-11 DIAGNOSIS — M542 Cervicalgia: Secondary | ICD-10-CM | POA: Diagnosis not present

## 2015-05-11 DIAGNOSIS — M9902 Segmental and somatic dysfunction of thoracic region: Secondary | ICD-10-CM | POA: Diagnosis not present

## 2015-05-11 DIAGNOSIS — M9901 Segmental and somatic dysfunction of cervical region: Secondary | ICD-10-CM | POA: Diagnosis not present

## 2015-05-11 DIAGNOSIS — M9904 Segmental and somatic dysfunction of sacral region: Secondary | ICD-10-CM | POA: Diagnosis not present

## 2015-05-12 DIAGNOSIS — M9900 Segmental and somatic dysfunction of head region: Secondary | ICD-10-CM | POA: Diagnosis not present

## 2015-05-12 DIAGNOSIS — G441 Vascular headache, not elsewhere classified: Secondary | ICD-10-CM | POA: Diagnosis not present

## 2015-05-12 DIAGNOSIS — M9904 Segmental and somatic dysfunction of sacral region: Secondary | ICD-10-CM | POA: Diagnosis not present

## 2015-05-12 DIAGNOSIS — R51 Headache: Secondary | ICD-10-CM | POA: Diagnosis not present

## 2015-05-12 DIAGNOSIS — M542 Cervicalgia: Secondary | ICD-10-CM | POA: Diagnosis not present

## 2015-05-12 DIAGNOSIS — M65319 Trigger thumb, unspecified thumb: Secondary | ICD-10-CM | POA: Diagnosis not present

## 2015-05-12 DIAGNOSIS — M0579 Rheumatoid arthritis with rheumatoid factor of multiple sites without organ or systems involvement: Secondary | ICD-10-CM | POA: Diagnosis not present

## 2015-05-12 DIAGNOSIS — M9902 Segmental and somatic dysfunction of thoracic region: Secondary | ICD-10-CM | POA: Diagnosis not present

## 2015-05-12 DIAGNOSIS — M9901 Segmental and somatic dysfunction of cervical region: Secondary | ICD-10-CM | POA: Diagnosis not present

## 2015-06-02 DIAGNOSIS — M9900 Segmental and somatic dysfunction of head region: Secondary | ICD-10-CM | POA: Diagnosis not present

## 2015-06-02 DIAGNOSIS — G441 Vascular headache, not elsewhere classified: Secondary | ICD-10-CM | POA: Diagnosis not present

## 2015-06-02 DIAGNOSIS — R51 Headache: Secondary | ICD-10-CM | POA: Diagnosis not present

## 2015-06-02 DIAGNOSIS — M9904 Segmental and somatic dysfunction of sacral region: Secondary | ICD-10-CM | POA: Diagnosis not present

## 2015-06-02 DIAGNOSIS — M542 Cervicalgia: Secondary | ICD-10-CM | POA: Diagnosis not present

## 2015-06-02 DIAGNOSIS — M9901 Segmental and somatic dysfunction of cervical region: Secondary | ICD-10-CM | POA: Diagnosis not present

## 2015-06-02 DIAGNOSIS — M9902 Segmental and somatic dysfunction of thoracic region: Secondary | ICD-10-CM | POA: Diagnosis not present

## 2015-07-15 ENCOUNTER — Ambulatory Visit (INDEPENDENT_AMBULATORY_CARE_PROVIDER_SITE_OTHER): Payer: Medicare Other | Admitting: Gynecology

## 2015-07-15 ENCOUNTER — Encounter: Payer: Self-pay | Admitting: Gynecology

## 2015-07-15 ENCOUNTER — Other Ambulatory Visit (HOSPITAL_COMMUNITY)
Admission: RE | Admit: 2015-07-15 | Discharge: 2015-07-15 | Disposition: A | Discharge status: Home / Self Care | Payer: Medicare Other | Source: Ambulatory Visit | Attending: Gynecology | Admitting: Gynecology

## 2015-07-15 VITALS — BP 144/90 | Ht 62.0 in | Wt 206.0 lb

## 2015-07-15 DIAGNOSIS — M8580 Other specified disorders of bone density and structure, unspecified site: Secondary | ICD-10-CM | POA: Diagnosis not present

## 2015-07-15 DIAGNOSIS — Z1151 Encounter for screening for human papillomavirus (HPV): Secondary | ICD-10-CM | POA: Diagnosis not present

## 2015-07-15 DIAGNOSIS — Z01419 Encounter for gynecological examination (general) (routine) without abnormal findings: Secondary | ICD-10-CM

## 2015-07-15 DIAGNOSIS — Z113 Encounter for screening for infections with a predominantly sexual mode of transmission: Secondary | ICD-10-CM

## 2015-07-15 LAB — WET PREP FOR TRICH, YEAST, CLUE
CLUE CELLS WET PREP: NONE SEEN
TRICH WET PREP: NONE SEEN
WBC WET PREP: NONE SEEN
YEAST WET PREP: NONE SEEN

## 2015-07-15 LAB — RPR

## 2015-07-15 NOTE — Patient Instructions (Signed)

## 2015-07-15 NOTE — Progress Notes (Signed)
Joanna Lee 02-Nov-1951 [id number]   History:    64 y.o.  for annual gyn exam who has not been seen in our office since 2003. Patient has been receiving her medical care at the [facility] Review of her records indicated that in 1978 she had a total abdominal hysterectomy with bilateral salpingo-oophorectomy as a result of chronic PID and chronic pelvic pain. Patient was requesting an STD screen today she is not having any vaginal discharge. She stated she had her bone density study done at the [facility] many years ago the last one 5 years ago. Patient denied any past history of any abnormal Pap smear. She had recent left knee replacement. Many years ago she had been on hormone replacement therapy. She is being followed here by her PCP Dr. Reynaldo Minium who is been doing her blood work. Her colonoscopy was reportedly normal less than 10 years ago and she is due for mammogram next month.  Past medical history,surgical history, family history and social history were all reviewed and documented in the [facility] chart.  Gynecologic History No LMP recorded. Patient has had a hysterectomy. Contraception: status post hysterectomy Last Pap: Several years ago. Results were: normal Last mammogram: 2015. Results were: normal  Obstetric History OB History  Gravida Para Term Preterm AB SAB TAB Ectopic Multiple Living  2 1   1     1     # Outcome Date GA Lbr Len/2nd Weight Sex Delivery Anes PTL Lv  2 AB           1 Para                ROS: A ROS was performed and pertinent positives and negatives are included in the history.  GENERAL: No fevers or chills. HEENT: No change in vision, no earache, sore throat or sinus congestion. NECK: No pain or stiffness. CARDIOVASCULAR: No chest pain or pressure. No palpitations. PULMONARY: No shortness of breath, cough or wheeze. GASTROINTESTINAL: No abdominal pain, nausea, vomiting or diarrhea, melena or bright red blood per rectum. GENITOURINARY: No urinary frequency,  urgency, hesitancy or dysuria. MUSCULOSKELETAL: No joint or muscle pain, no back pain, no recent trauma. DERMATOLOGIC: No rash, no itching, no lesions. ENDOCRINE: No polyuria, polydipsia, no heat or cold intolerance. No recent change in weight. HEMATOLOGICAL: No anemia or easy bruising or bleeding. NEUROLOGIC: No headache, seizures, numbness, tingling or weakness. PSYCHIATRIC: No depression, no loss of interest in normal activity or change in sleep pattern.     Exam: chaperone present  BP 144/90 mmHg  Ht 5\' 2"  (1.575 m)  Wt 206 lb (93.441 kg)  BMI 37.67 kg/m2  Body mass index is 37.67 kg/(m^2).  General appearance : Well developed well nourished female. No acute distress HEENT: Eyes: no retinal hemorrhage or exudates,  Neck supple, trachea midline, no carotid bruits, no thyroidmegaly Lungs: Clear to auscultation, no rhonchi or wheezes, or rib retractions  Heart: Regular rate and rhythm, no murmurs or gallops Breast:Examined in sitting and supine position were symmetrical in appearance, no palpable masses or tenderness,  no skin retraction, no nipple inversion, no nipple discharge, no skin discoloration, no axillary or supraclavicular lymphadenopathy Abdomen: no palpable masses or tenderness, no rebound or guarding Extremities: no edema or skin discoloration or tenderness  Pelvic:  Bartholin, Urethra, Skene Glands: Within normal limits             Vagina: No gross lesions or discharge  Cervix: Absent  Uterus  Absent  Adnexa: No palpable mass or tenderness   Rectovaginal  normal sphincter tone without palpated masses or tenderness             HemoccuPCP provides  Assessment/plan: 64 year old patient requesting STD screening. We'll screen for gonorrhea and chlamydia, HIV, RPR, hepatitis B and C. A Pap smear will be done today. Her wet prep was negative. We'll request medical release of patient's last 2 bone density studies at the [facility]. Patient to schedule a bone density  study here in the office. Patient was noted to have several moles on her anterior abdomen somewhat hyperpigmented and raised I want to refer her to the dermatologist for further evaluation. We discussed importance of calcium vitamin D and regular exercise for osteoporosis prevention.    Terrance Mass MD, 12:41 PM 07/15/2015

## 2015-07-16 LAB — GC/CHLAMYDIA PROBE AMP, URINE
CHLAMYDIA, SWAB/URINE, PCR: NEGATIVE
GC Probe Amp, Urine: NEGATIVE

## 2015-07-16 LAB — HEPATITIS C ANTIBODY: HCV Ab: NEGATIVE

## 2015-07-16 LAB — HEPATITIS B SURFACE ANTIGEN: Hepatitis B Surface Ag: NEGATIVE

## 2015-07-16 LAB — HIV ANTIBODY (ROUTINE TESTING W REFLEX): HIV: NONREACTIVE

## 2015-07-18 LAB — CYTOLOGY - PAP

## 2015-08-01 DIAGNOSIS — E785 Hyperlipidemia, unspecified: Secondary | ICD-10-CM | POA: Diagnosis not present

## 2015-08-01 DIAGNOSIS — I10 Essential (primary) hypertension: Secondary | ICD-10-CM | POA: Diagnosis not present

## 2015-08-08 DIAGNOSIS — M1990 Unspecified osteoarthritis, unspecified site: Secondary | ICD-10-CM | POA: Diagnosis not present

## 2015-08-08 DIAGNOSIS — E876 Hypokalemia: Secondary | ICD-10-CM | POA: Diagnosis not present

## 2015-08-08 DIAGNOSIS — M069 Rheumatoid arthritis, unspecified: Secondary | ICD-10-CM | POA: Diagnosis not present

## 2015-08-08 DIAGNOSIS — Z0000 Encounter for general adult medical examination without abnormal findings: Secondary | ICD-10-CM | POA: Diagnosis not present

## 2015-08-08 DIAGNOSIS — I10 Essential (primary) hypertension: Secondary | ICD-10-CM | POA: Diagnosis not present

## 2015-08-08 DIAGNOSIS — Z6836 Body mass index (BMI) 36.0-36.9, adult: Secondary | ICD-10-CM | POA: Diagnosis not present

## 2015-08-08 DIAGNOSIS — E785 Hyperlipidemia, unspecified: Secondary | ICD-10-CM | POA: Diagnosis not present

## 2015-08-08 DIAGNOSIS — E669 Obesity, unspecified: Secondary | ICD-10-CM | POA: Diagnosis not present

## 2015-08-08 DIAGNOSIS — R05 Cough: Secondary | ICD-10-CM | POA: Diagnosis not present

## 2015-08-10 DIAGNOSIS — Z1212 Encounter for screening for malignant neoplasm of rectum: Secondary | ICD-10-CM | POA: Diagnosis not present

## 2015-08-31 ENCOUNTER — Other Ambulatory Visit: Payer: Self-pay | Admitting: Radiology

## 2015-08-31 DIAGNOSIS — I6523 Occlusion and stenosis of bilateral carotid arteries: Secondary | ICD-10-CM

## 2015-09-06 ENCOUNTER — Other Ambulatory Visit (HOSPITAL_COMMUNITY): Payer: Self-pay | Admitting: Interventional Radiology

## 2015-09-06 DIAGNOSIS — I6523 Occlusion and stenosis of bilateral carotid arteries: Secondary | ICD-10-CM

## 2015-09-07 ENCOUNTER — Ambulatory Visit (HOSPITAL_COMMUNITY)
Admission: RE | Admit: 2015-09-07 | Discharge: 2015-09-07 | Disposition: A | Discharge status: Home / Self Care | Payer: Medicare Other | Source: Ambulatory Visit | Attending: Interventional Radiology | Admitting: Interventional Radiology

## 2015-09-07 DIAGNOSIS — I6529 Occlusion and stenosis of unspecified carotid artery: Secondary | ICD-10-CM | POA: Diagnosis not present

## 2015-09-07 DIAGNOSIS — I6523 Occlusion and stenosis of bilateral carotid arteries: Secondary | ICD-10-CM | POA: Diagnosis not present

## 2015-09-07 NOTE — Progress Notes (Signed)
*  PRELIMINARY RESULTS* Vascular Ultrasound Carotid Duplex (Doppler) has been completed.  Preliminary findings:  Right mid ICA is in the 60-79% range and the right distal ICA is in the 40-59% range, based on velocities. Left mid ICA is in the 40-59% range, based on velocities. Bilateral ICAs appear tortuous. No plaque morphology noted to suggest stenosis. Increased velocities most likely due to tortuosity.    Landry Mellow, RDMS, RVT  09/07/2015, 9:48 AM

## 2015-09-20 ENCOUNTER — Ambulatory Visit: Payer: Medicare Other | Admitting: Neurology

## 2015-09-22 ENCOUNTER — Telehealth (HOSPITAL_COMMUNITY): Payer: Self-pay

## 2015-09-22 NOTE — Telephone Encounter (Signed)
Called to pt and let her know that Dr. Estanislado Pandy wants her to f/u with a Carotid Ultrasound in 6 months. Pt agreed with this plan.

## 2015-10-19 DIAGNOSIS — R51 Headache: Secondary | ICD-10-CM | POA: Diagnosis not present

## 2015-10-19 DIAGNOSIS — M9902 Segmental and somatic dysfunction of thoracic region: Secondary | ICD-10-CM | POA: Diagnosis not present

## 2015-10-19 DIAGNOSIS — M9900 Segmental and somatic dysfunction of head region: Secondary | ICD-10-CM | POA: Diagnosis not present

## 2015-10-19 DIAGNOSIS — M9901 Segmental and somatic dysfunction of cervical region: Secondary | ICD-10-CM | POA: Diagnosis not present

## 2015-10-19 DIAGNOSIS — M9904 Segmental and somatic dysfunction of sacral region: Secondary | ICD-10-CM | POA: Diagnosis not present

## 2015-10-19 DIAGNOSIS — M542 Cervicalgia: Secondary | ICD-10-CM | POA: Diagnosis not present

## 2015-10-19 DIAGNOSIS — G441 Vascular headache, not elsewhere classified: Secondary | ICD-10-CM | POA: Diagnosis not present

## 2015-10-20 DIAGNOSIS — R51 Headache: Secondary | ICD-10-CM | POA: Diagnosis not present

## 2015-10-20 DIAGNOSIS — M9904 Segmental and somatic dysfunction of sacral region: Secondary | ICD-10-CM | POA: Diagnosis not present

## 2015-10-20 DIAGNOSIS — M9902 Segmental and somatic dysfunction of thoracic region: Secondary | ICD-10-CM | POA: Diagnosis not present

## 2015-10-20 DIAGNOSIS — G441 Vascular headache, not elsewhere classified: Secondary | ICD-10-CM | POA: Diagnosis not present

## 2015-10-20 DIAGNOSIS — M9901 Segmental and somatic dysfunction of cervical region: Secondary | ICD-10-CM | POA: Diagnosis not present

## 2015-10-20 DIAGNOSIS — M542 Cervicalgia: Secondary | ICD-10-CM | POA: Diagnosis not present

## 2015-10-20 DIAGNOSIS — M9900 Segmental and somatic dysfunction of head region: Secondary | ICD-10-CM | POA: Diagnosis not present

## 2015-10-25 DIAGNOSIS — M9900 Segmental and somatic dysfunction of head region: Secondary | ICD-10-CM | POA: Diagnosis not present

## 2015-10-25 DIAGNOSIS — M542 Cervicalgia: Secondary | ICD-10-CM | POA: Diagnosis not present

## 2015-10-25 DIAGNOSIS — M9901 Segmental and somatic dysfunction of cervical region: Secondary | ICD-10-CM | POA: Diagnosis not present

## 2015-10-25 DIAGNOSIS — M9902 Segmental and somatic dysfunction of thoracic region: Secondary | ICD-10-CM | POA: Diagnosis not present

## 2015-10-25 DIAGNOSIS — R51 Headache: Secondary | ICD-10-CM | POA: Diagnosis not present

## 2015-10-25 DIAGNOSIS — G441 Vascular headache, not elsewhere classified: Secondary | ICD-10-CM | POA: Diagnosis not present

## 2015-10-25 DIAGNOSIS — M9904 Segmental and somatic dysfunction of sacral region: Secondary | ICD-10-CM | POA: Diagnosis not present

## 2015-10-26 DIAGNOSIS — M9901 Segmental and somatic dysfunction of cervical region: Secondary | ICD-10-CM | POA: Diagnosis not present

## 2015-10-26 DIAGNOSIS — M9904 Segmental and somatic dysfunction of sacral region: Secondary | ICD-10-CM | POA: Diagnosis not present

## 2015-10-26 DIAGNOSIS — M542 Cervicalgia: Secondary | ICD-10-CM | POA: Diagnosis not present

## 2015-10-26 DIAGNOSIS — R51 Headache: Secondary | ICD-10-CM | POA: Diagnosis not present

## 2015-10-26 DIAGNOSIS — M9900 Segmental and somatic dysfunction of head region: Secondary | ICD-10-CM | POA: Diagnosis not present

## 2015-10-26 DIAGNOSIS — G441 Vascular headache, not elsewhere classified: Secondary | ICD-10-CM | POA: Diagnosis not present

## 2015-10-26 DIAGNOSIS — M9902 Segmental and somatic dysfunction of thoracic region: Secondary | ICD-10-CM | POA: Diagnosis not present

## 2015-10-27 DIAGNOSIS — M9902 Segmental and somatic dysfunction of thoracic region: Secondary | ICD-10-CM | POA: Diagnosis not present

## 2015-10-27 DIAGNOSIS — M542 Cervicalgia: Secondary | ICD-10-CM | POA: Diagnosis not present

## 2015-10-27 DIAGNOSIS — M9900 Segmental and somatic dysfunction of head region: Secondary | ICD-10-CM | POA: Diagnosis not present

## 2015-10-27 DIAGNOSIS — M9901 Segmental and somatic dysfunction of cervical region: Secondary | ICD-10-CM | POA: Diagnosis not present

## 2015-10-27 DIAGNOSIS — M9904 Segmental and somatic dysfunction of sacral region: Secondary | ICD-10-CM | POA: Diagnosis not present

## 2015-10-27 DIAGNOSIS — G441 Vascular headache, not elsewhere classified: Secondary | ICD-10-CM | POA: Diagnosis not present

## 2015-10-27 DIAGNOSIS — R51 Headache: Secondary | ICD-10-CM | POA: Diagnosis not present

## 2015-11-01 DIAGNOSIS — M542 Cervicalgia: Secondary | ICD-10-CM | POA: Diagnosis not present

## 2015-11-01 DIAGNOSIS — M9904 Segmental and somatic dysfunction of sacral region: Secondary | ICD-10-CM | POA: Diagnosis not present

## 2015-11-01 DIAGNOSIS — M9901 Segmental and somatic dysfunction of cervical region: Secondary | ICD-10-CM | POA: Diagnosis not present

## 2015-11-01 DIAGNOSIS — G441 Vascular headache, not elsewhere classified: Secondary | ICD-10-CM | POA: Diagnosis not present

## 2015-11-01 DIAGNOSIS — M9900 Segmental and somatic dysfunction of head region: Secondary | ICD-10-CM | POA: Diagnosis not present

## 2015-11-01 DIAGNOSIS — M9902 Segmental and somatic dysfunction of thoracic region: Secondary | ICD-10-CM | POA: Diagnosis not present

## 2015-11-01 DIAGNOSIS — R51 Headache: Secondary | ICD-10-CM | POA: Diagnosis not present

## 2015-11-02 DIAGNOSIS — M9904 Segmental and somatic dysfunction of sacral region: Secondary | ICD-10-CM | POA: Diagnosis not present

## 2015-11-02 DIAGNOSIS — M542 Cervicalgia: Secondary | ICD-10-CM | POA: Diagnosis not present

## 2015-11-02 DIAGNOSIS — M9901 Segmental and somatic dysfunction of cervical region: Secondary | ICD-10-CM | POA: Diagnosis not present

## 2015-11-02 DIAGNOSIS — R51 Headache: Secondary | ICD-10-CM | POA: Diagnosis not present

## 2015-11-02 DIAGNOSIS — M9902 Segmental and somatic dysfunction of thoracic region: Secondary | ICD-10-CM | POA: Diagnosis not present

## 2015-11-02 DIAGNOSIS — G441 Vascular headache, not elsewhere classified: Secondary | ICD-10-CM | POA: Diagnosis not present

## 2015-11-02 DIAGNOSIS — M9900 Segmental and somatic dysfunction of head region: Secondary | ICD-10-CM | POA: Diagnosis not present

## 2015-11-07 DIAGNOSIS — M9902 Segmental and somatic dysfunction of thoracic region: Secondary | ICD-10-CM | POA: Diagnosis not present

## 2015-11-07 DIAGNOSIS — G441 Vascular headache, not elsewhere classified: Secondary | ICD-10-CM | POA: Diagnosis not present

## 2015-11-07 DIAGNOSIS — R51 Headache: Secondary | ICD-10-CM | POA: Diagnosis not present

## 2015-11-07 DIAGNOSIS — M9904 Segmental and somatic dysfunction of sacral region: Secondary | ICD-10-CM | POA: Diagnosis not present

## 2015-11-07 DIAGNOSIS — M9901 Segmental and somatic dysfunction of cervical region: Secondary | ICD-10-CM | POA: Diagnosis not present

## 2015-11-07 DIAGNOSIS — M9900 Segmental and somatic dysfunction of head region: Secondary | ICD-10-CM | POA: Diagnosis not present

## 2015-11-07 DIAGNOSIS — M542 Cervicalgia: Secondary | ICD-10-CM | POA: Diagnosis not present

## 2015-11-09 DIAGNOSIS — M9901 Segmental and somatic dysfunction of cervical region: Secondary | ICD-10-CM | POA: Diagnosis not present

## 2015-11-09 DIAGNOSIS — M9904 Segmental and somatic dysfunction of sacral region: Secondary | ICD-10-CM | POA: Diagnosis not present

## 2015-11-09 DIAGNOSIS — M542 Cervicalgia: Secondary | ICD-10-CM | POA: Diagnosis not present

## 2015-11-09 DIAGNOSIS — M9902 Segmental and somatic dysfunction of thoracic region: Secondary | ICD-10-CM | POA: Diagnosis not present

## 2015-11-09 DIAGNOSIS — G441 Vascular headache, not elsewhere classified: Secondary | ICD-10-CM | POA: Diagnosis not present

## 2015-11-09 DIAGNOSIS — M9900 Segmental and somatic dysfunction of head region: Secondary | ICD-10-CM | POA: Diagnosis not present

## 2015-11-09 DIAGNOSIS — R51 Headache: Secondary | ICD-10-CM | POA: Diagnosis not present

## 2015-12-02 DIAGNOSIS — R070 Pain in throat: Secondary | ICD-10-CM | POA: Diagnosis not present

## 2015-12-02 DIAGNOSIS — Z6835 Body mass index (BMI) 35.0-35.9, adult: Secondary | ICD-10-CM | POA: Diagnosis not present

## 2015-12-02 DIAGNOSIS — J069 Acute upper respiratory infection, unspecified: Secondary | ICD-10-CM | POA: Diagnosis not present

## 2015-12-05 DIAGNOSIS — M1711 Unilateral primary osteoarthritis, right knee: Secondary | ICD-10-CM | POA: Diagnosis not present

## 2015-12-05 DIAGNOSIS — Z471 Aftercare following joint replacement surgery: Secondary | ICD-10-CM | POA: Diagnosis not present

## 2015-12-05 DIAGNOSIS — Z96652 Presence of left artificial knee joint: Secondary | ICD-10-CM | POA: Diagnosis not present

## 2015-12-26 DIAGNOSIS — R51 Headache: Secondary | ICD-10-CM | POA: Diagnosis not present

## 2015-12-26 DIAGNOSIS — M542 Cervicalgia: Secondary | ICD-10-CM | POA: Diagnosis not present

## 2015-12-26 DIAGNOSIS — M9901 Segmental and somatic dysfunction of cervical region: Secondary | ICD-10-CM | POA: Diagnosis not present

## 2015-12-26 DIAGNOSIS — M9900 Segmental and somatic dysfunction of head region: Secondary | ICD-10-CM | POA: Diagnosis not present

## 2015-12-26 DIAGNOSIS — M9904 Segmental and somatic dysfunction of sacral region: Secondary | ICD-10-CM | POA: Diagnosis not present

## 2015-12-26 DIAGNOSIS — M9902 Segmental and somatic dysfunction of thoracic region: Secondary | ICD-10-CM | POA: Diagnosis not present

## 2015-12-26 DIAGNOSIS — G441 Vascular headache, not elsewhere classified: Secondary | ICD-10-CM | POA: Diagnosis not present

## 2016-01-06 DIAGNOSIS — M1711 Unilateral primary osteoarthritis, right knee: Secondary | ICD-10-CM | POA: Diagnosis not present

## 2016-01-06 DIAGNOSIS — Z471 Aftercare following joint replacement surgery: Secondary | ICD-10-CM | POA: Diagnosis not present

## 2016-01-06 DIAGNOSIS — Z96652 Presence of left artificial knee joint: Secondary | ICD-10-CM | POA: Diagnosis not present

## 2016-02-14 ENCOUNTER — Telehealth (HOSPITAL_COMMUNITY): Payer: Self-pay

## 2016-02-14 ENCOUNTER — Telehealth: Payer: Self-pay | Admitting: Neurology

## 2016-02-14 NOTE — Telephone Encounter (Signed)
Called to have pt f/u in 1 year with a MRI per Dr. Estanislado Pandy, left message for pt to call back. AW

## 2016-02-14 NOTE — Telephone Encounter (Signed)
Pt agreed to f/u in 1 year with a Carotid US per Dr. Estanislado Pandy. AW

## 2016-02-14 NOTE — Telephone Encounter (Signed)
Pt has some questions about how to get a cpap machine please call [phone]

## 2016-02-14 NOTE — Telephone Encounter (Signed)
Lmovm to rtn my call. 

## 2016-03-29 DIAGNOSIS — Z471 Aftercare following joint replacement surgery: Secondary | ICD-10-CM | POA: Diagnosis not present

## 2016-03-29 DIAGNOSIS — Z96652 Presence of left artificial knee joint: Secondary | ICD-10-CM | POA: Diagnosis not present

## 2016-03-29 DIAGNOSIS — M1711 Unilateral primary osteoarthritis, right knee: Secondary | ICD-10-CM | POA: Diagnosis not present

## 2016-05-15 DIAGNOSIS — M1711 Unilateral primary osteoarthritis, right knee: Secondary | ICD-10-CM | POA: Diagnosis not present

## 2016-05-25 DIAGNOSIS — M1711 Unilateral primary osteoarthritis, right knee: Secondary | ICD-10-CM | POA: Diagnosis not present

## 2016-06-01 DIAGNOSIS — M1711 Unilateral primary osteoarthritis, right knee: Secondary | ICD-10-CM | POA: Diagnosis not present

## 2016-06-22 IMAGING — MR MR HEAD WO/W CM
15 of 18 series · 36 of 48 positions shown · IV contrast (multihance)
Comparison: Brain MRI and MRA 02/04/2014 and earlier.

CLINICAL DATA: 63-year-old female with seizure activity on EEG.
Initial encounter. Partial history of left cervical ICA aneurysm
treated with stent assisted coil embolization.

EXAM:
MRI HEAD WITHOUT AND WITH CONTRAST
MRA HEAD WITHOUT CONTRAST
MRA NECK WITHOUT AND WITH CONTRAST
TECHNIQUE: Multiplanar, multiecho pulse sequences of the brain and surrounding
structures were obtained without and with intravenous contrast.
Angiographic images of the Circle of Willis were obtained using MRA
technique without intravenous contrast. Angiographic images of the
neck were obtained using MRA technique without and with intravenous
contrast. Carotid stenosis measurements (when applicable) are
obtained utilizing NASCET criteria, using the distal internal
carotid diameter as the denominator.
CONTRAST:  18mL MULTIHANCE GADOBENATE DIMEGLUMINE 529 MG/ML IV SOLN

[Series 2: T1 · sagittal · 5.0mm · 0.45mm/px · 1 of 19 slices shown]
[im 1/19]
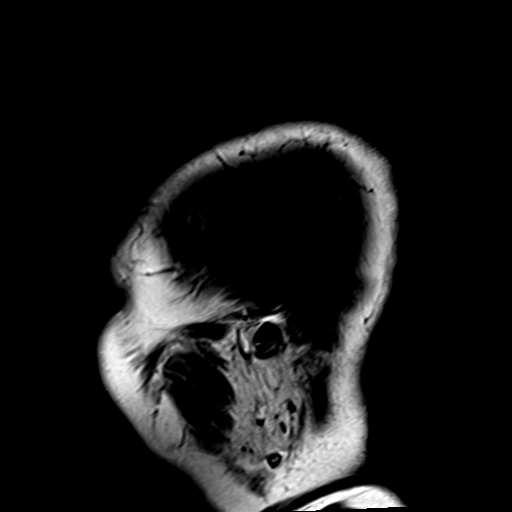

[Series 3: ep2d_diff_(id)_trace · axial · 3.0mm · 1.80mm/px · z∈[-34,+113]mm · 4 of 100 slices shown]
[im 1/100]
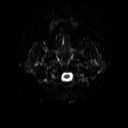
[im 34/100]
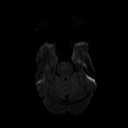
[im 67/100]
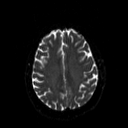
[im 100/100]
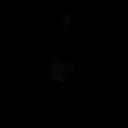

[Series 4: ep2d_diff_(id)_trace_adc · axial · 3.0mm · 1.80mm/px · z∈[-34,+113]mm · 2 of 49 slices shown]
[im 1/49]
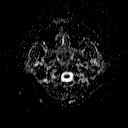
[im 49/49]
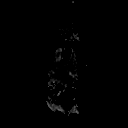

[Series 5: ep2d_diff cor for · coronal · 5.0mm · 0.90mm/px · 3 of 68 slices shown (1 of 2)]
[im 1/68]
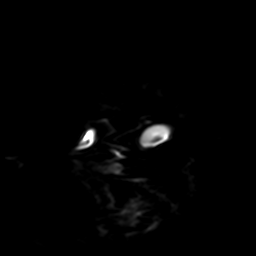
[im 34/68]
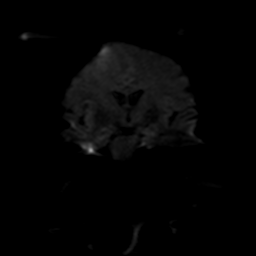
[im 68/68]
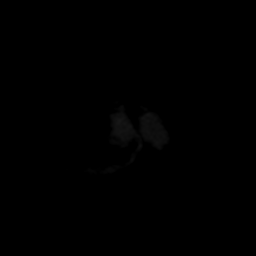

[Series 6: ep2d_diff cor for · coronal · 5.0mm · 0.90mm/px · 2 of 34 slices shown (2 of 2)]
[im 1/34]
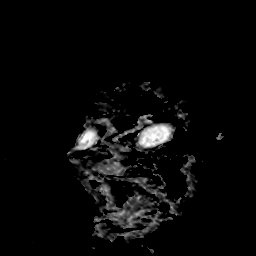
[im 34/34]
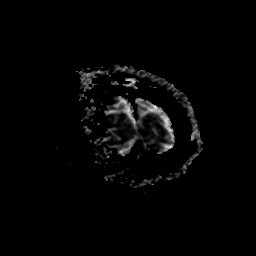

[Series 8: tof_3d_multi-slab new · axial · 0.7mm · 0.37mm/px · z∈[-10,+71]mm · 5 of 117 slices shown]
[im 1/117]
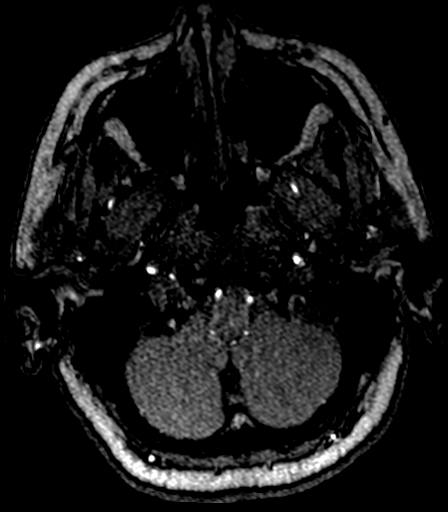
[im 30/117]
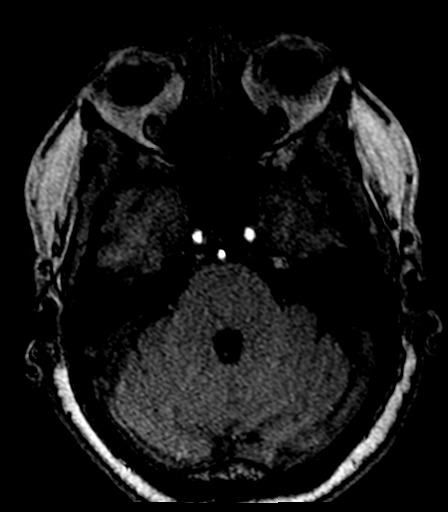
[im 59/117]
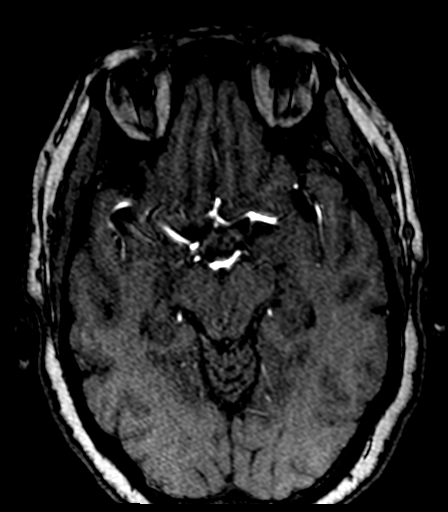
[im 88/117]
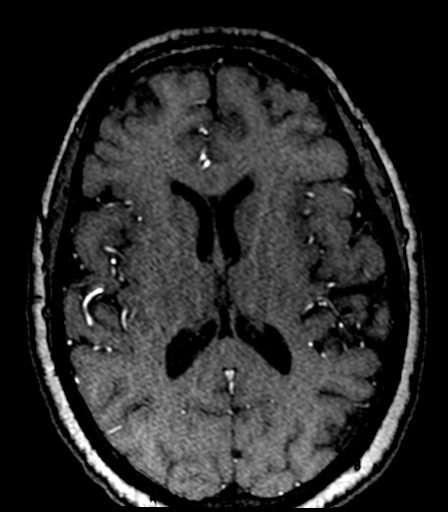
[im 117/117]
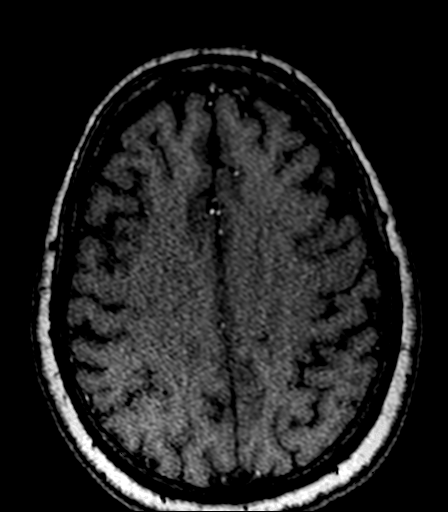

[Series 17: T2 · axial · 5.0mm · 0.30mm/px · 1 of 22 slices shown (1 of 3)]
[im 1/22]
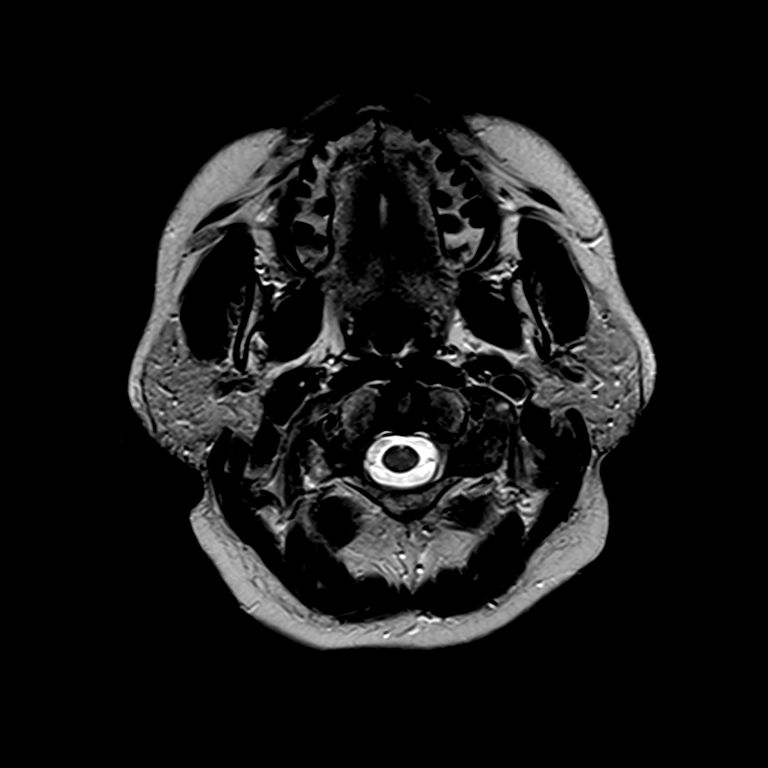

[Series 20: T2 · coronal · 3.5mm · 0.59mm/px · 1 of 24 slices shown (2 of 3)]
[im 1/24]
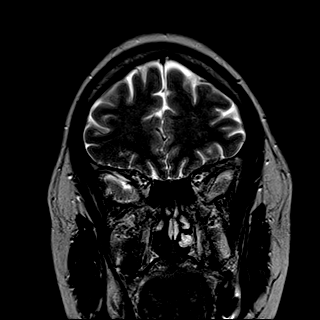

[Series 22: swi_images · axial · 2.0mm · 0.90mm/px · z∈[-39,+119]mm · 4 of 80 slices shown]
[im 1/80]
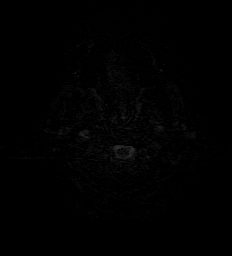
[im 27/80]
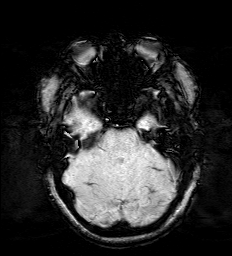
[im 53/80]
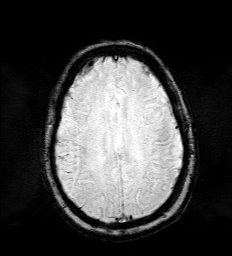
[im 80/80]
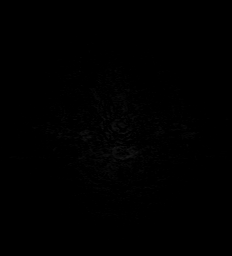

[Series 23: FLAIR · axial · 5.0mm · 0.45mm/px · 1 of 22 slices shown]
[im 1/22]
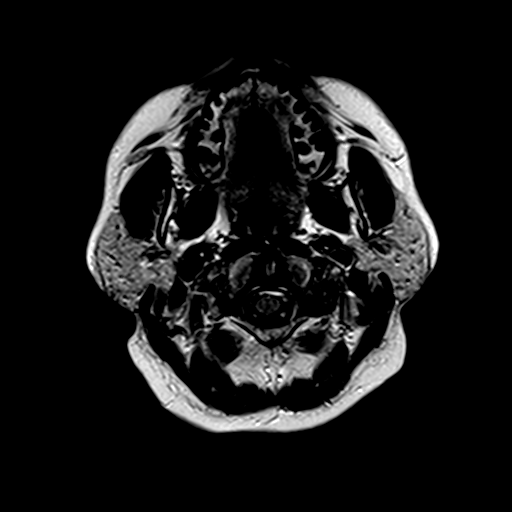

[Series 28: (id) · axial · 3.0mm · 0.98mm/px · z∈[-100,-1]mm · 2 of 50 slices shown]
[im 1/50]
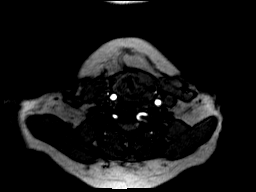
[im 50/50]
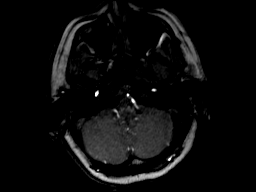

[Series 32: (id)_tt=1.0s · coronal · 0.8mm · 0.78mm/px · 4 of 88 slices shown (1 of 2)]
[im 1/88]
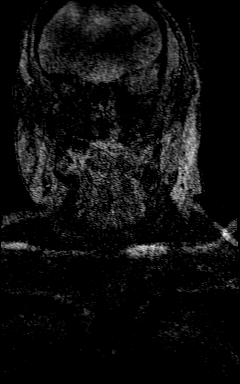
[im 30/88]
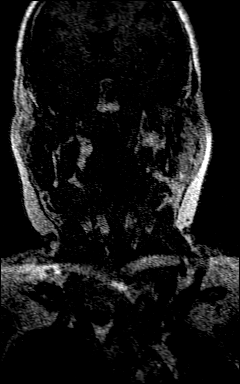
[im 59/88]
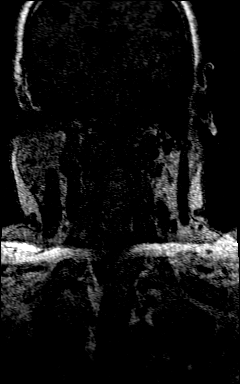
[im 88/88]
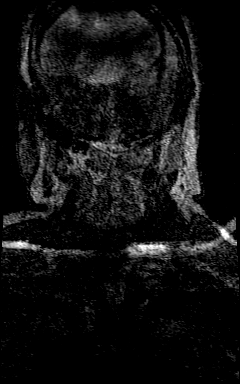

[Series 34: (id)_tt=1.0s · coronal · 0.8mm · 0.78mm/px · 4 of 87 slices shown (2 of 2)]
[im 1/87]
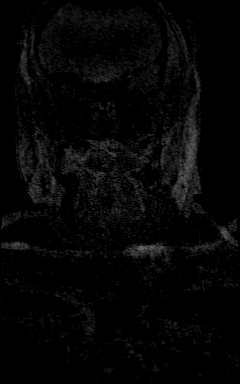
[im 29/87]
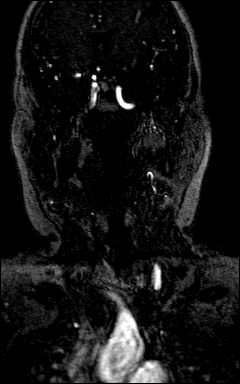
[im 58/87]
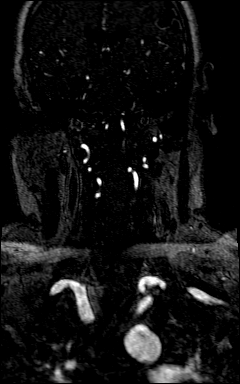
[im 87/87]
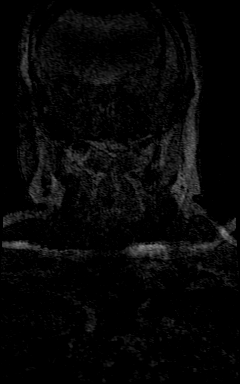

[Series 40: T2 · coronal · 5.0mm · 0.45mm/px · 1 of 24 slices shown (3 of 3)]
[im 1/24]
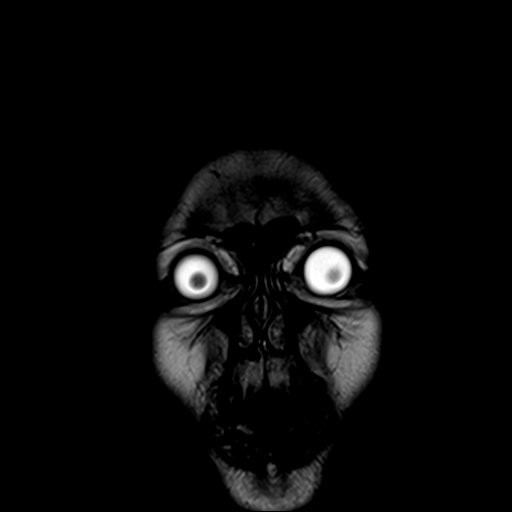

[Series 43: T1 post-contrast · coronal · 5.0mm · 0.43mm/px · 1 of 24 slices shown]
[im 1/24]
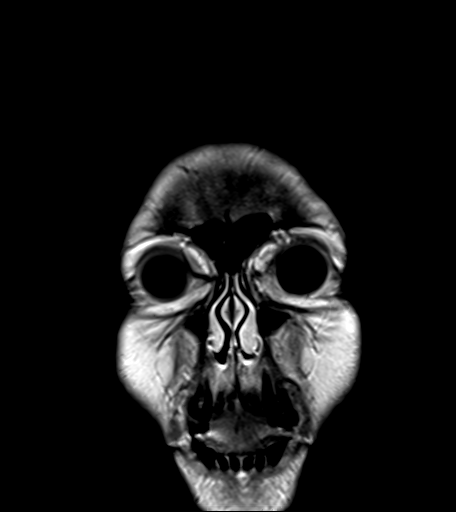

[36 of 48 positions shown; findings below may reference images not displayed]

FINDINGS: MRI HEAD FINDINGS

Stable cerebral volume since 5849. Major intracranial vascular flow
voids are stable. No restricted diffusion to suggest acute
infarction. No midline shift, mass effect, evidence of mass lesion,
ventriculomegaly, extra-axial collection or acute intracranial
hemorrhage. Cervicomedullary junction and pituitary are within
normal limits.

Mesial temporal lobe structures including the hippocampal complexes
appear symmetric and within normal limits. Stable gray and white
matter signal. No abnormal enhancement identified.

Visible internal auditory structures appear normal. Visualized
paranasal sinuses and mastoids are clear. Visualized orbit soft
tissues are within normal limits. Visualized scalp soft tissues are
within normal limits. Normal bone marrow signal.

MRA HEAD FINDINGS

Stable antegrade flow in the distal vertebral arteries. PICA origins
remain normal. Normal vertebrobasilar junction. No basilar stenosis.
SCA and PCA origins are stable and within normal limits. Bilateral
PCA branches are within normal limits.

Stable antegrade flow in both ICA siphons. No siphon stenosis.
Ophthalmic artery origins are stable. Patent carotid termini. MCA
and ACA origins are stable. Anterior communicating artery and
visualized bilateral ACA branches remain within normal limits.
Visualized bilateral MCA branches are stable and within normal
limits.

MRA NECK FINDINGS

Precontrast time-of-flight images demonstrate antegrade flow in both
carotid and vertebral arteries to the skullbase.

Post-contrast MRA images re- demonstrate a 3 vessel arch
configuration, with no great vessel origin stenosis.

Tortuous proximal right CCA and subclavian arteries with no
stenosis. Widely patent right carotid bifurcation. Highly tortuous
cervical right ICA, otherwise negative right carotid system in the
neck.

Tortuous proximal left CCA. Widely patent left carotid bifurcation.
Height leak tortuous cervical left ICA re- identified. Unchanged
appearance of the cervical left ICA at the area of pseudo aneurysm
coil embolization (series 44, image 8) compared to 8119. Left ICA
siphon remains patent.

Tortuous proximal left subclavian artery. Both vertebral artery
origins are within normal limits. Codominant vertebral arteries are
intermittently tortuous, stable to the skullbase.
IMPRESSION: 1. No acute intracranial abnormality. Stable and normal for age MRI
appearance of the brain.
2. Stable neck MRA since 8119 status post coil embolization of
cervical left ICA pseudoaneurysm. Highly tortuous bilateral carotid
arteries.
3. Stable and negative intracranial MRA since [DATE].

## 2016-07-13 DIAGNOSIS — M1711 Unilateral primary osteoarthritis, right knee: Secondary | ICD-10-CM | POA: Diagnosis not present

## 2016-08-13 DIAGNOSIS — M542 Cervicalgia: Secondary | ICD-10-CM | POA: Diagnosis not present

## 2016-08-13 DIAGNOSIS — M9902 Segmental and somatic dysfunction of thoracic region: Secondary | ICD-10-CM | POA: Diagnosis not present

## 2016-08-13 DIAGNOSIS — M9901 Segmental and somatic dysfunction of cervical region: Secondary | ICD-10-CM | POA: Diagnosis not present

## 2016-08-13 DIAGNOSIS — M5412 Radiculopathy, cervical region: Secondary | ICD-10-CM | POA: Diagnosis not present

## 2016-08-14 DIAGNOSIS — M9901 Segmental and somatic dysfunction of cervical region: Secondary | ICD-10-CM | POA: Diagnosis not present

## 2016-08-14 DIAGNOSIS — M5412 Radiculopathy, cervical region: Secondary | ICD-10-CM | POA: Diagnosis not present

## 2016-08-14 DIAGNOSIS — M9902 Segmental and somatic dysfunction of thoracic region: Secondary | ICD-10-CM | POA: Diagnosis not present

## 2016-08-14 DIAGNOSIS — M542 Cervicalgia: Secondary | ICD-10-CM | POA: Diagnosis not present

## 2016-08-16 DIAGNOSIS — M9901 Segmental and somatic dysfunction of cervical region: Secondary | ICD-10-CM | POA: Diagnosis not present

## 2016-08-16 DIAGNOSIS — M5412 Radiculopathy, cervical region: Secondary | ICD-10-CM | POA: Diagnosis not present

## 2016-08-16 DIAGNOSIS — M9902 Segmental and somatic dysfunction of thoracic region: Secondary | ICD-10-CM | POA: Diagnosis not present

## 2016-08-16 DIAGNOSIS — M542 Cervicalgia: Secondary | ICD-10-CM | POA: Diagnosis not present

## 2016-08-17 DIAGNOSIS — M1990 Unspecified osteoarthritis, unspecified site: Secondary | ICD-10-CM | POA: Diagnosis not present

## 2016-08-17 DIAGNOSIS — M069 Rheumatoid arthritis, unspecified: Secondary | ICD-10-CM | POA: Diagnosis not present

## 2016-08-17 DIAGNOSIS — I10 Essential (primary) hypertension: Secondary | ICD-10-CM | POA: Diagnosis not present

## 2016-08-17 DIAGNOSIS — M5010 Cervical disc disorder with radiculopathy, unspecified cervical region: Secondary | ICD-10-CM | POA: Diagnosis not present

## 2016-08-17 DIAGNOSIS — Z79899 Other long term (current) drug therapy: Secondary | ICD-10-CM | POA: Diagnosis not present

## 2016-08-17 DIAGNOSIS — Z6835 Body mass index (BMI) 35.0-35.9, adult: Secondary | ICD-10-CM | POA: Diagnosis not present

## 2016-08-20 DIAGNOSIS — M9901 Segmental and somatic dysfunction of cervical region: Secondary | ICD-10-CM | POA: Diagnosis not present

## 2016-08-20 DIAGNOSIS — M9902 Segmental and somatic dysfunction of thoracic region: Secondary | ICD-10-CM | POA: Diagnosis not present

## 2016-08-20 DIAGNOSIS — M5412 Radiculopathy, cervical region: Secondary | ICD-10-CM | POA: Diagnosis not present

## 2016-08-20 DIAGNOSIS — M542 Cervicalgia: Secondary | ICD-10-CM | POA: Diagnosis not present

## 2016-08-23 DIAGNOSIS — M9902 Segmental and somatic dysfunction of thoracic region: Secondary | ICD-10-CM | POA: Diagnosis not present

## 2016-08-23 DIAGNOSIS — M542 Cervicalgia: Secondary | ICD-10-CM | POA: Diagnosis not present

## 2016-08-23 DIAGNOSIS — M5412 Radiculopathy, cervical region: Secondary | ICD-10-CM | POA: Diagnosis not present

## 2016-08-23 DIAGNOSIS — M9901 Segmental and somatic dysfunction of cervical region: Secondary | ICD-10-CM | POA: Diagnosis not present

## 2016-08-27 DIAGNOSIS — M9901 Segmental and somatic dysfunction of cervical region: Secondary | ICD-10-CM | POA: Diagnosis not present

## 2016-08-27 DIAGNOSIS — M9902 Segmental and somatic dysfunction of thoracic region: Secondary | ICD-10-CM | POA: Diagnosis not present

## 2016-08-27 DIAGNOSIS — M542 Cervicalgia: Secondary | ICD-10-CM | POA: Diagnosis not present

## 2016-08-27 DIAGNOSIS — M5412 Radiculopathy, cervical region: Secondary | ICD-10-CM | POA: Diagnosis not present

## 2016-09-04 DIAGNOSIS — M9901 Segmental and somatic dysfunction of cervical region: Secondary | ICD-10-CM | POA: Diagnosis not present

## 2016-09-04 DIAGNOSIS — M542 Cervicalgia: Secondary | ICD-10-CM | POA: Diagnosis not present

## 2016-09-04 DIAGNOSIS — M9902 Segmental and somatic dysfunction of thoracic region: Secondary | ICD-10-CM | POA: Diagnosis not present

## 2016-09-04 DIAGNOSIS — M5412 Radiculopathy, cervical region: Secondary | ICD-10-CM | POA: Diagnosis not present

## 2016-09-21 ENCOUNTER — Encounter (HOSPITAL_COMMUNITY): Payer: Self-pay

## 2016-09-21 ENCOUNTER — Emergency Department (HOSPITAL_COMMUNITY): Payer: No Typology Code available for payment source

## 2016-09-21 ENCOUNTER — Emergency Department (HOSPITAL_COMMUNITY)
Admission: EM | Admit: 2016-09-21 | Discharge: 2016-09-21 | Disposition: A | Discharge status: Home / Self Care | Payer: No Typology Code available for payment source | Attending: Emergency Medicine | Admitting: Emergency Medicine

## 2016-09-21 DIAGNOSIS — Y92410 Unspecified street and highway as the place of occurrence of the external cause: Secondary | ICD-10-CM | POA: Diagnosis not present

## 2016-09-21 DIAGNOSIS — Z7982 Long term (current) use of aspirin: Secondary | ICD-10-CM | POA: Insufficient documentation

## 2016-09-21 DIAGNOSIS — Y939 Activity, unspecified: Secondary | ICD-10-CM | POA: Insufficient documentation

## 2016-09-21 DIAGNOSIS — I10 Essential (primary) hypertension: Secondary | ICD-10-CM | POA: Diagnosis not present

## 2016-09-21 DIAGNOSIS — S161XXA Strain of muscle, fascia and tendon at neck level, initial encounter: Secondary | ICD-10-CM | POA: Diagnosis not present

## 2016-09-21 DIAGNOSIS — Z96652 Presence of left artificial knee joint: Secondary | ICD-10-CM | POA: Diagnosis not present

## 2016-09-21 DIAGNOSIS — Y999 Unspecified external cause status: Secondary | ICD-10-CM | POA: Diagnosis not present

## 2016-09-21 DIAGNOSIS — M542 Cervicalgia: Secondary | ICD-10-CM | POA: Diagnosis not present

## 2016-09-21 DIAGNOSIS — S199XXA Unspecified injury of neck, initial encounter: Secondary | ICD-10-CM | POA: Diagnosis present
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
## 2016-09-24 DIAGNOSIS — M0579 Rheumatoid arthritis with rheumatoid factor of multiple sites without organ or systems involvement: Secondary | ICD-10-CM | POA: Diagnosis not present

## 2016-09-24 DIAGNOSIS — M797 Fibromyalgia: Secondary | ICD-10-CM | POA: Diagnosis not present

## 2016-09-24 DIAGNOSIS — M79641 Pain in right hand: Secondary | ICD-10-CM | POA: Diagnosis not present

## 2016-09-24 DIAGNOSIS — R5383 Other fatigue: Secondary | ICD-10-CM | POA: Diagnosis not present

## 2016-09-24 DIAGNOSIS — M542 Cervicalgia: Secondary | ICD-10-CM | POA: Diagnosis not present

## 2016-09-24 DIAGNOSIS — M79642 Pain in left hand: Secondary | ICD-10-CM | POA: Diagnosis not present

## 2016-09-24 DIAGNOSIS — M2550 Pain in unspecified joint: Secondary | ICD-10-CM | POA: Diagnosis not present

## 2016-09-24 DIAGNOSIS — M150 Primary generalized (osteo)arthritis: Secondary | ICD-10-CM | POA: Diagnosis not present

## 2016-10-05 DIAGNOSIS — M1711 Unilateral primary osteoarthritis, right knee: Secondary | ICD-10-CM | POA: Diagnosis not present

## 2016-10-23 DIAGNOSIS — M2550 Pain in unspecified joint: Secondary | ICD-10-CM | POA: Diagnosis not present

## 2016-10-23 DIAGNOSIS — M797 Fibromyalgia: Secondary | ICD-10-CM | POA: Diagnosis not present

## 2016-10-23 DIAGNOSIS — M150 Primary generalized (osteo)arthritis: Secondary | ICD-10-CM | POA: Diagnosis not present

## 2016-10-23 DIAGNOSIS — Z6834 Body mass index (BMI) 34.0-34.9, adult: Secondary | ICD-10-CM | POA: Diagnosis not present

## 2016-10-23 DIAGNOSIS — M542 Cervicalgia: Secondary | ICD-10-CM | POA: Diagnosis not present

## 2016-10-23 DIAGNOSIS — M0579 Rheumatoid arthritis with rheumatoid factor of multiple sites without organ or systems involvement: Secondary | ICD-10-CM | POA: Diagnosis not present

## 2016-10-23 DIAGNOSIS — E669 Obesity, unspecified: Secondary | ICD-10-CM | POA: Diagnosis not present

## 2016-12-11 DIAGNOSIS — M1711 Unilateral primary osteoarthritis, right knee: Secondary | ICD-10-CM | POA: Diagnosis not present

## 2016-12-12 ENCOUNTER — Telehealth (HOSPITAL_COMMUNITY): Payer: Self-pay

## 2016-12-12 NOTE — Telephone Encounter (Signed)
Called to schedule f/u us carotid, left message for pt to return call. AW 

## 2016-12-19 DIAGNOSIS — M1711 Unilateral primary osteoarthritis, right knee: Secondary | ICD-10-CM | POA: Diagnosis not present

## 2016-12-26 DIAGNOSIS — M1711 Unilateral primary osteoarthritis, right knee: Secondary | ICD-10-CM | POA: Diagnosis not present

## 2017-01-22 DIAGNOSIS — M797 Fibromyalgia: Secondary | ICD-10-CM | POA: Diagnosis not present

## 2017-01-22 DIAGNOSIS — M150 Primary generalized (osteo)arthritis: Secondary | ICD-10-CM | POA: Diagnosis not present

## 2017-01-22 DIAGNOSIS — M2550 Pain in unspecified joint: Secondary | ICD-10-CM | POA: Diagnosis not present

## 2017-01-22 DIAGNOSIS — Z6835 Body mass index (BMI) 35.0-35.9, adult: Secondary | ICD-10-CM | POA: Diagnosis not present

## 2017-01-22 DIAGNOSIS — M0579 Rheumatoid arthritis with rheumatoid factor of multiple sites without organ or systems involvement: Secondary | ICD-10-CM | POA: Diagnosis not present

## 2017-01-22 DIAGNOSIS — E669 Obesity, unspecified: Secondary | ICD-10-CM | POA: Diagnosis not present

## 2017-02-06 DIAGNOSIS — M79674 Pain in right toe(s): Secondary | ICD-10-CM | POA: Diagnosis not present

## 2017-02-06 DIAGNOSIS — L600 Ingrowing nail: Secondary | ICD-10-CM | POA: Diagnosis not present

## 2017-02-12 DIAGNOSIS — I10 Essential (primary) hypertension: Secondary | ICD-10-CM | POA: Diagnosis not present

## 2017-02-20 DIAGNOSIS — E784 Other hyperlipidemia: Secondary | ICD-10-CM | POA: Diagnosis not present

## 2017-02-20 DIAGNOSIS — M1990 Unspecified osteoarthritis, unspecified site: Secondary | ICD-10-CM | POA: Diagnosis not present

## 2017-02-20 DIAGNOSIS — I10 Essential (primary) hypertension: Secondary | ICD-10-CM | POA: Diagnosis not present

## 2017-02-20 DIAGNOSIS — Z1389 Encounter for screening for other disorder: Secondary | ICD-10-CM | POA: Diagnosis not present

## 2017-02-20 DIAGNOSIS — M79671 Pain in right foot: Secondary | ICD-10-CM | POA: Diagnosis not present

## 2017-02-20 DIAGNOSIS — E876 Hypokalemia: Secondary | ICD-10-CM | POA: Diagnosis not present

## 2017-02-20 DIAGNOSIS — Z008 Encounter for other general examination: Secondary | ICD-10-CM | POA: Diagnosis not present

## 2017-02-20 DIAGNOSIS — Z6835 Body mass index (BMI) 35.0-35.9, adult: Secondary | ICD-10-CM | POA: Diagnosis not present

## 2017-02-20 DIAGNOSIS — G43009 Migraine without aura, not intractable, without status migrainosus: Secondary | ICD-10-CM | POA: Diagnosis not present

## 2017-02-20 DIAGNOSIS — M5010 Cervical disc disorder with radiculopathy, unspecified cervical region: Secondary | ICD-10-CM | POA: Diagnosis not present

## 2017-02-20 DIAGNOSIS — M069 Rheumatoid arthritis, unspecified: Secondary | ICD-10-CM | POA: Diagnosis not present

## 2017-02-20 DIAGNOSIS — L03031 Cellulitis of right toe: Secondary | ICD-10-CM | POA: Diagnosis not present

## 2017-02-20 DIAGNOSIS — E668 Other obesity: Secondary | ICD-10-CM | POA: Diagnosis not present

## 2017-02-28 ENCOUNTER — Other Ambulatory Visit (HOSPITAL_COMMUNITY): Payer: Self-pay | Admitting: Interventional Radiology

## 2017-02-28 DIAGNOSIS — I729 Aneurysm of unspecified site: Secondary | ICD-10-CM

## 2017-03-06 ENCOUNTER — Ambulatory Visit (HOSPITAL_COMMUNITY)
Admission: RE | Admit: 2017-03-06 | Discharge: 2017-03-06 | Disposition: A | Discharge status: Home / Self Care | Payer: Medicare Other | Source: Ambulatory Visit | Attending: Interventional Radiology | Admitting: Interventional Radiology

## 2017-03-06 DIAGNOSIS — I6523 Occlusion and stenosis of bilateral carotid arteries: Secondary | ICD-10-CM | POA: Insufficient documentation

## 2017-03-06 DIAGNOSIS — I729 Aneurysm of unspecified site: Secondary | ICD-10-CM | POA: Diagnosis not present

## 2017-03-06 NOTE — Progress Notes (Signed)
VASCULAR LAB PRELIMINARY  PRELIMINARY  PRELIMINARY  PRELIMINARY  Carotid duplex completed.    Preliminary report:  Right - 40% to 59% ICA stenosis by velocity. Plaque morphology does not support the increase. Probably due to extreme tortuosity. Vertebral artery flow is antegrade. Left - 1% to 39 % ICA stenosis by velocity. Plaque morphology does not support the increase. Tortuous. Vertebral artery flow is antegrade.  Lendell Gallick, RVS 03/06/2017, 3:57 PM

## 2017-03-07 DIAGNOSIS — L02611 Cutaneous abscess of right foot: Secondary | ICD-10-CM | POA: Diagnosis not present

## 2017-03-07 DIAGNOSIS — Z1212 Encounter for screening for malignant neoplasm of rectum: Secondary | ICD-10-CM | POA: Diagnosis not present

## 2017-03-07 LAB — VAS US CAROTID
LCCADDIAS: -16 cm/s
LCCAPSYS: 104 cm/s
LEFT ECA DIAS: -7 cm/s
LEFT VERTEBRAL DIAS: 15 cm/s
LICADSYS: 88 cm/s
Left CCA dist sys: -55 cm/s
Left CCA prox dias: 14 cm/s
Left ICA dist dias: 35 cm/s
Left ICA prox dias: -30 cm/s
Left ICA prox sys: -90 cm/s
RCCADSYS: -108 cm/s
RCCAPDIAS: -21 cm/s
RIGHT ECA DIAS: -9 cm/s
RIGHT VERTEBRAL DIAS: -17 cm/s
Right CCA prox sys: -91 cm/s

## 2017-03-11 ENCOUNTER — Telehealth (HOSPITAL_COMMUNITY): Payer: Self-pay

## 2017-03-11 NOTE — Telephone Encounter (Signed)
Called, mailbox full, will try back later. AW

## 2017-03-28 ENCOUNTER — Telehealth (HOSPITAL_COMMUNITY): Payer: Self-pay

## 2017-03-28 DIAGNOSIS — L03031 Cellulitis of right toe: Secondary | ICD-10-CM | POA: Diagnosis not present

## 2017-03-28 NOTE — Telephone Encounter (Signed)
Left message for pt to return call. AW 

## 2017-03-29 ENCOUNTER — Telehealth (HOSPITAL_COMMUNITY): Payer: Self-pay

## 2017-03-29 NOTE — Telephone Encounter (Signed)
Pt agreed to f/u in 6 months with cta head and neck and to call to schedule sooner if she starts to have sx. AW

## 2017-04-03 ENCOUNTER — Encounter: Payer: Self-pay | Admitting: Gynecology

## 2017-04-10 DIAGNOSIS — L03031 Cellulitis of right toe: Secondary | ICD-10-CM | POA: Diagnosis not present

## 2017-05-01 ENCOUNTER — Other Ambulatory Visit (HOSPITAL_COMMUNITY): Payer: Self-pay | Admitting: Interventional Radiology

## 2017-05-01 DIAGNOSIS — I729 Aneurysm of unspecified site: Secondary | ICD-10-CM

## 2017-05-10 ENCOUNTER — Other Ambulatory Visit (HOSPITAL_COMMUNITY): Payer: Federal, State, Local not specified - PPO

## 2017-06-02 ENCOUNTER — Encounter (HOSPITAL_COMMUNITY): Payer: Self-pay | Admitting: Family Medicine

## 2017-06-02 ENCOUNTER — Ambulatory Visit (HOSPITAL_COMMUNITY)
Admission: EM | Admit: 2017-06-02 | Discharge: 2017-06-02 | Disposition: A | Discharge status: Home / Self Care | Payer: Medicare Other | Attending: Physician Assistant | Admitting: Physician Assistant

## 2017-06-02 DIAGNOSIS — J301 Allergic rhinitis due to pollen: Secondary | ICD-10-CM

## 2017-06-02 MED ORDER — BENZONATATE 100 MG PO CAPS: 100.0000 mg | ORAL_CAPSULE | Freq: Three times a day (TID) | ORAL | 0 refills | Status: DC

## 2017-06-02 MED ORDER — MONTELUKAST SODIUM 10 MG PO TABS: 10.0000 mg | ORAL_TABLET | Freq: Every day | ORAL | 0 refills | Status: AC

## 2017-06-02 NOTE — Discharge Instructions (Signed)
Please follow up with the [facility] if your symptoms worsen.

## 2017-06-02 NOTE — ED Provider Notes (Signed)
CSN: [id number]     Arrival date & time 06/02/17  1859 History   None    Chief Complaint  Patient presents with  . Nasal Congestion  . Cough   (Consider location/radiation/quality/duration/timing/severity/associated sxs/prior Treatment) HPI  Cough that started a few days ago. Associates sore throat and nasal congestion.  Feels that she is getting somewhat better.  She has not tried any medications yet. Did recently stop taking singular, which she takes for sinuses but she denies a history of asthma.    Past Medical History:  Diagnosis Date  . Anemia   . Carotid artery aneurysm ([place]) 2005  . History of blood transfusion   . HTN (hypertension)   . Migraines   . Osteopenia   . PONV (postoperative nausea and vomiting)    also states "SLOW TO WAKE UP"  . Rheumatoid arthritis(714.0)   . Sleep apnea    hasnt  used CPAP in years  . Trichomonas infection   . Vertigo   . Vitamin D deficiency    Past Surgical History:  Procedure Laterality Date  . ABDOMINAL HYSTERECTOMY     COMPLETE HYST  . Coiling of Carotid Aneurysm  2005   CAROTID  . KNEE SURGERY  2000/2009   Bilateral/ arthroscopy  . SHOULDER SURGERY  1996   Left  . TOTAL KNEE ARTHROPLASTY Left 02/08/2014   Procedure: LEFT TOTAL KNEE ARTHROPLASTY;  Surgeon: [name], MD;  Location: [facility] ORS;  Service: Orthopedics;  Laterality: Left;   Family History  Problem Relation Age of Onset  . Heart attack Brother 33  . Lung cancer Father   . Lung cancer Brother   . Colon cancer Brother        x 2  . Colon cancer Sister   . Colon cancer Sister   . Hypertension Other   . Diabetes Other   . Gout Other   . Multiple sclerosis Sister   . Ovarian cancer Sister   . Breast cancer Mother   . Diabetes Mother   . Hypertension Mother   . Asthma Brother    Social History  Substance Use Topics  . Smoking status: Never Smoker  . Smokeless tobacco: Never Used  . Alcohol use 0.6 oz/week    1 Glasses of wine per week     Comment:  weekly WINE   OB History    Gravida Para Term Preterm AB Living   2 1     1 1    SAB TAB Ectopic Multiple Live Births                 Review of Systems  Constitutional: Negative for activity change, appetite change and fever.  HENT: Positive for sore throat.   Respiratory: Negative for cough and shortness of breath.   Cardiovascular: Negative for chest pain and leg swelling.  Neurological: Negative for dizziness and headaches.    Allergies  Codeine  Home Medications   Prior to Admission medications   Medication Sig Start Date End Date Taking? Authorizing Provider  acetaminophen (TYLENOL) 325 MG tablet Take 2 tablets (650 mg total) by mouth every 6 (six) hours as needed for mild pain (or Fever >/= 101). 02/10/14   Perkins, Alexzandrew L, PA-C  aspirin 81 MG tablet Take 81 mg by mouth at bedtime.    [provider]  benzonatate (TESSALON) 100 MG capsule Take 1 capsule (100 mg total) by mouth every 8 (eight) hours. 06/02/17   Tereasa Coop, PA-C  cholecalciferol (VITAMIN D)  1000 UNITS tablet Take 1,000 Units by mouth at bedtime.    [provider]  folic acid (FOLVITE) 1 MG tablet Take 1 mg by mouth at bedtime.    [provider]  methotrexate (RHEUMATREX) 2.5 MG tablet Take 12.5 mg by mouth once a week. Every Friday.    [provider]  montelukast (SINGULAIR) 10 MG tablet Take 1 tablet (10 mg total) by mouth at bedtime. 06/02/17   Tereasa Coop, PA-C  naproxen sodium (ANAPROX) 220 MG tablet Take 220 mg by mouth 2 (two) times daily as needed (pain).    [provider]  valsartan-hydrochlorothiazide (DIOVAN-HCT) 160-12.5 MG per tablet Take 1 tablet by mouth at bedtime.    [provider]   Meds Ordered and Administered this Visit  Medications - No data to display  BP 117/85   Pulse 73   Temp 98.5 F (36.9 C) (Oral)   Resp 18   SpO2 96%  No data found.   Physical Exam  Constitutional: She appears well-developed and  well-nourished. No distress.  HENT:  Right Ear: External ear normal.  Left Ear: External ear normal.  Nose: Nose normal.  Mouth/Throat: Oropharynx is clear and moist. No oropharyngeal exudate.  Cardiovascular: Normal rate, regular rhythm, normal heart sounds and intact distal pulses.  Exam reveals no gallop and no friction rub.   No murmur heard. Pulmonary/Chest: Effort normal. No respiratory distress. She has no wheezes. She has no rales. She exhibits no tenderness.  Skin: She is not diaphoretic.    Urgent Care Course     Procedures (including critical care time)        MDM   1. Seasonal allergic rhinitis due to pollen    Exam is normal.  She recently stopped allergy medications.  Advised she start those back.     Tereasa Coop, PA-C 06/02/17 2007

## 2017-06-02 NOTE — ED Triage Notes (Signed)
Pt here for cough, congestion. sts SOB with coughing.

## 2017-06-10 DIAGNOSIS — R05 Cough: Secondary | ICD-10-CM | POA: Diagnosis not present

## 2017-06-10 DIAGNOSIS — Z6836 Body mass index (BMI) 36.0-36.9, adult: Secondary | ICD-10-CM | POA: Diagnosis not present

## 2017-06-10 DIAGNOSIS — R062 Wheezing: Secondary | ICD-10-CM | POA: Diagnosis not present

## 2017-06-10 DIAGNOSIS — J209 Acute bronchitis, unspecified: Secondary | ICD-10-CM | POA: Diagnosis not present

## 2017-06-29 ENCOUNTER — Other Ambulatory Visit: Payer: Self-pay | Admitting: Physician Assistant

## 2017-07-14 ENCOUNTER — Other Ambulatory Visit: Payer: Self-pay | Admitting: Physician Assistant

## 2017-08-07 DIAGNOSIS — Z79899 Other long term (current) drug therapy: Secondary | ICD-10-CM | POA: Diagnosis not present

## 2017-08-07 DIAGNOSIS — R600 Localized edema: Secondary | ICD-10-CM | POA: Diagnosis not present

## 2017-08-07 DIAGNOSIS — I517 Cardiomegaly: Secondary | ICD-10-CM | POA: Diagnosis not present

## 2017-08-07 DIAGNOSIS — Z7982 Long term (current) use of aspirin: Secondary | ICD-10-CM | POA: Diagnosis not present

## 2017-08-07 DIAGNOSIS — T465X1A Poisoning by other antihypertensive drugs, accidental (unintentional), initial encounter: Secondary | ICD-10-CM | POA: Diagnosis not present

## 2017-08-07 DIAGNOSIS — I10 Essential (primary) hypertension: Secondary | ICD-10-CM | POA: Diagnosis not present

## 2017-08-07 DIAGNOSIS — M7989 Other specified soft tissue disorders: Secondary | ICD-10-CM | POA: Diagnosis not present

## 2017-08-07 DIAGNOSIS — R0602 Shortness of breath: Secondary | ICD-10-CM | POA: Diagnosis not present

## 2017-08-07 DIAGNOSIS — I771 Stricture of artery: Secondary | ICD-10-CM | POA: Diagnosis not present

## 2017-08-07 DIAGNOSIS — T461X5A Adverse effect of calcium-channel blockers, initial encounter: Secondary | ICD-10-CM | POA: Diagnosis not present

## 2017-08-20 ENCOUNTER — Telehealth (HOSPITAL_COMMUNITY): Payer: Self-pay

## 2017-08-20 NOTE — Telephone Encounter (Signed)
Pt will call back to schedule when she gets to work. AW

## 2017-08-21 DIAGNOSIS — E7849 Other hyperlipidemia: Secondary | ICD-10-CM | POA: Diagnosis not present

## 2017-08-21 DIAGNOSIS — Z79899 Other long term (current) drug therapy: Secondary | ICD-10-CM | POA: Diagnosis not present

## 2017-08-21 DIAGNOSIS — M1990 Unspecified osteoarthritis, unspecified site: Secondary | ICD-10-CM | POA: Diagnosis not present

## 2017-08-21 DIAGNOSIS — I77819 Aortic ectasia, unspecified site: Secondary | ICD-10-CM | POA: Diagnosis not present

## 2017-08-21 DIAGNOSIS — Z6835 Body mass index (BMI) 35.0-35.9, adult: Secondary | ICD-10-CM | POA: Diagnosis not present

## 2017-08-21 DIAGNOSIS — I10 Essential (primary) hypertension: Secondary | ICD-10-CM | POA: Diagnosis not present

## 2017-08-21 DIAGNOSIS — M5010 Cervical disc disorder with radiculopathy, unspecified cervical region: Secondary | ICD-10-CM | POA: Diagnosis not present

## 2017-08-21 DIAGNOSIS — E876 Hypokalemia: Secondary | ICD-10-CM | POA: Diagnosis not present

## 2017-08-21 DIAGNOSIS — I6529 Occlusion and stenosis of unspecified carotid artery: Secondary | ICD-10-CM | POA: Diagnosis not present

## 2017-08-21 DIAGNOSIS — H8110 Benign paroxysmal vertigo, unspecified ear: Secondary | ICD-10-CM | POA: Diagnosis not present

## 2017-08-21 DIAGNOSIS — N899 Noninflammatory disorder of vagina, unspecified: Secondary | ICD-10-CM | POA: Diagnosis not present

## 2017-09-25 DIAGNOSIS — M1711 Unilateral primary osteoarthritis, right knee: Secondary | ICD-10-CM | POA: Diagnosis not present

## 2017-10-02 DIAGNOSIS — M1711 Unilateral primary osteoarthritis, right knee: Secondary | ICD-10-CM | POA: Diagnosis not present

## 2017-10-09 DIAGNOSIS — M1711 Unilateral primary osteoarthritis, right knee: Secondary | ICD-10-CM | POA: Diagnosis not present

## 2017-10-23 ENCOUNTER — Telehealth (HOSPITAL_COMMUNITY): Payer: Self-pay

## 2018-01-22 DIAGNOSIS — H11132 Conjunctival pigmentations, left eye: Secondary | ICD-10-CM | POA: Diagnosis not present

## 2018-02-07 ENCOUNTER — Other Ambulatory Visit (HOSPITAL_COMMUNITY): Payer: Self-pay | Admitting: Interventional Radiology

## 2018-02-07 DIAGNOSIS — I671 Cerebral aneurysm, nonruptured: Secondary | ICD-10-CM

## 2018-02-10 ENCOUNTER — Ambulatory Visit (HOSPITAL_COMMUNITY)
Admission: RE | Admit: 2018-02-10 | Discharge: 2018-02-10 | Disposition: A | Discharge status: Home / Self Care | Payer: Medicare Other | Source: Ambulatory Visit | Attending: Interventional Radiology | Admitting: Interventional Radiology

## 2018-02-10 ENCOUNTER — Encounter (HOSPITAL_COMMUNITY): Payer: Self-pay

## 2018-02-10 ENCOUNTER — Other Ambulatory Visit (HOSPITAL_COMMUNITY): Payer: Self-pay | Admitting: Interventional Radiology

## 2018-02-10 DIAGNOSIS — I720 Aneurysm of carotid artery: Secondary | ICD-10-CM | POA: Diagnosis not present

## 2018-02-10 DIAGNOSIS — I671 Cerebral aneurysm, nonruptured: Secondary | ICD-10-CM

## 2018-02-10 LAB — POCT I-STAT CREATININE: Creatinine, Ser: 0.7 mg/dL (ref 0.44–1.00)

## 2018-02-10 MED ORDER — IOPAMIDOL (ISOVUE-370) INJECTION 76%
INTRAVENOUS | Status: AC
  Administered 2018-02-10: 50 mL
  Filled 2018-02-10: qty 50

## 2018-02-20 DIAGNOSIS — R82998 Other abnormal findings in urine: Secondary | ICD-10-CM | POA: Diagnosis not present

## 2018-02-20 DIAGNOSIS — E7849 Other hyperlipidemia: Secondary | ICD-10-CM | POA: Diagnosis not present

## 2018-02-20 DIAGNOSIS — I10 Essential (primary) hypertension: Secondary | ICD-10-CM | POA: Diagnosis not present

## 2018-02-25 DIAGNOSIS — M1990 Unspecified osteoarthritis, unspecified site: Secondary | ICD-10-CM | POA: Diagnosis not present

## 2018-02-25 DIAGNOSIS — E7849 Other hyperlipidemia: Secondary | ICD-10-CM | POA: Diagnosis not present

## 2018-02-25 DIAGNOSIS — Z1389 Encounter for screening for other disorder: Secondary | ICD-10-CM | POA: Diagnosis not present

## 2018-02-25 DIAGNOSIS — Z6835 Body mass index (BMI) 35.0-35.9, adult: Secondary | ICD-10-CM | POA: Diagnosis not present

## 2018-02-25 DIAGNOSIS — I6529 Occlusion and stenosis of unspecified carotid artery: Secondary | ICD-10-CM | POA: Diagnosis not present

## 2018-02-25 DIAGNOSIS — I10 Essential (primary) hypertension: Secondary | ICD-10-CM | POA: Diagnosis not present

## 2018-02-25 DIAGNOSIS — Z0000 Encounter for general adult medical examination without abnormal findings: Secondary | ICD-10-CM | POA: Diagnosis not present

## 2018-02-25 DIAGNOSIS — H8110 Benign paroxysmal vertigo, unspecified ear: Secondary | ICD-10-CM | POA: Diagnosis not present

## 2018-02-25 DIAGNOSIS — I77810 Thoracic aortic ectasia: Secondary | ICD-10-CM | POA: Diagnosis not present

## 2018-02-25 DIAGNOSIS — M5010 Cervical disc disorder with radiculopathy, unspecified cervical region: Secondary | ICD-10-CM | POA: Diagnosis not present

## 2018-02-25 DIAGNOSIS — M797 Fibromyalgia: Secondary | ICD-10-CM | POA: Diagnosis not present

## 2018-02-25 DIAGNOSIS — M069 Rheumatoid arthritis, unspecified: Secondary | ICD-10-CM | POA: Diagnosis not present

## 2018-03-11 ENCOUNTER — Telehealth (HOSPITAL_COMMUNITY): Payer: Self-pay

## 2018-03-11 NOTE — Telephone Encounter (Signed)
Left a message for pt to f/u in 2 years with CTA head/neck. AW

## 2018-09-01 DIAGNOSIS — J309 Allergic rhinitis, unspecified: Secondary | ICD-10-CM | POA: Diagnosis not present

## 2018-09-01 DIAGNOSIS — I6529 Occlusion and stenosis of unspecified carotid artery: Secondary | ICD-10-CM | POA: Diagnosis not present

## 2018-09-01 DIAGNOSIS — M797 Fibromyalgia: Secondary | ICD-10-CM | POA: Diagnosis not present

## 2018-09-01 DIAGNOSIS — H8110 Benign paroxysmal vertigo, unspecified ear: Secondary | ICD-10-CM | POA: Diagnosis not present

## 2018-09-01 DIAGNOSIS — M069 Rheumatoid arthritis, unspecified: Secondary | ICD-10-CM | POA: Diagnosis not present

## 2018-09-01 DIAGNOSIS — M1990 Unspecified osteoarthritis, unspecified site: Secondary | ICD-10-CM | POA: Diagnosis not present

## 2018-09-01 DIAGNOSIS — G43009 Migraine without aura, not intractable, without status migrainosus: Secondary | ICD-10-CM | POA: Diagnosis not present

## 2018-09-01 DIAGNOSIS — I10 Essential (primary) hypertension: Secondary | ICD-10-CM | POA: Diagnosis not present

## 2018-09-01 DIAGNOSIS — I77819 Aortic ectasia, unspecified site: Secondary | ICD-10-CM | POA: Diagnosis not present

## 2018-09-01 DIAGNOSIS — Z79899 Other long term (current) drug therapy: Secondary | ICD-10-CM | POA: Diagnosis not present

## 2018-09-01 DIAGNOSIS — M5010 Cervical disc disorder with radiculopathy, unspecified cervical region: Secondary | ICD-10-CM | POA: Diagnosis not present

## 2018-09-01 DIAGNOSIS — E7849 Other hyperlipidemia: Secondary | ICD-10-CM | POA: Diagnosis not present

## 2018-09-05 ENCOUNTER — Other Ambulatory Visit (HOSPITAL_COMMUNITY): Payer: Self-pay | Admitting: Interventional Radiology

## 2018-09-05 DIAGNOSIS — I671 Cerebral aneurysm, nonruptured: Secondary | ICD-10-CM

## 2018-09-10 DIAGNOSIS — M5412 Radiculopathy, cervical region: Secondary | ICD-10-CM | POA: Diagnosis not present

## 2018-09-10 DIAGNOSIS — M47812 Spondylosis without myelopathy or radiculopathy, cervical region: Secondary | ICD-10-CM | POA: Diagnosis not present

## 2018-09-10 DIAGNOSIS — M5030 Other cervical disc degeneration, unspecified cervical region: Secondary | ICD-10-CM | POA: Diagnosis not present

## 2018-09-10 DIAGNOSIS — M4802 Spinal stenosis, cervical region: Secondary | ICD-10-CM | POA: Diagnosis not present

## 2018-09-12 ENCOUNTER — Ambulatory Visit (HOSPITAL_COMMUNITY): Payer: Federal, State, Local not specified - PPO

## 2018-09-13 ENCOUNTER — Other Ambulatory Visit: Payer: Self-pay | Admitting: Internal Medicine

## 2018-09-13 DIAGNOSIS — E041 Nontoxic single thyroid nodule: Secondary | ICD-10-CM

## 2018-09-24 ENCOUNTER — Ambulatory Visit
Admission: RE | Admit: 2018-09-24 | Discharge: 2018-09-24 | Disposition: A | Discharge status: Home / Self Care | Payer: Medicare Other | Source: Ambulatory Visit | Attending: Internal Medicine | Admitting: Internal Medicine

## 2018-09-24 DIAGNOSIS — E041 Nontoxic single thyroid nodule: Secondary | ICD-10-CM

## 2018-09-24 DIAGNOSIS — E042 Nontoxic multinodular goiter: Secondary | ICD-10-CM | POA: Diagnosis not present

## 2018-10-08 ENCOUNTER — Telehealth (HOSPITAL_COMMUNITY): Payer: Self-pay | Admitting: Radiology

## 2018-10-08 NOTE — Telephone Encounter (Signed)
Returned pt's call to schedule consult. Left VM for her to call to set up. JM

## 2018-10-20 DIAGNOSIS — M5412 Radiculopathy, cervical region: Secondary | ICD-10-CM | POA: Diagnosis not present

## 2018-10-20 DIAGNOSIS — M5030 Other cervical disc degeneration, unspecified cervical region: Secondary | ICD-10-CM | POA: Diagnosis not present

## 2018-10-20 DIAGNOSIS — M4802 Spinal stenosis, cervical region: Secondary | ICD-10-CM | POA: Diagnosis not present

## 2018-10-20 DIAGNOSIS — M47812 Spondylosis without myelopathy or radiculopathy, cervical region: Secondary | ICD-10-CM | POA: Diagnosis not present

## 2018-10-28 DIAGNOSIS — H52223 Regular astigmatism, bilateral: Secondary | ICD-10-CM | POA: Diagnosis not present

## 2018-10-28 DIAGNOSIS — H2513 Age-related nuclear cataract, bilateral: Secondary | ICD-10-CM | POA: Diagnosis not present

## 2018-10-28 DIAGNOSIS — H5213 Myopia, bilateral: Secondary | ICD-10-CM | POA: Diagnosis not present

## 2018-11-17 DIAGNOSIS — M5030 Other cervical disc degeneration, unspecified cervical region: Secondary | ICD-10-CM | POA: Diagnosis not present

## 2018-11-17 DIAGNOSIS — M47812 Spondylosis without myelopathy or radiculopathy, cervical region: Secondary | ICD-10-CM | POA: Diagnosis not present

## 2018-11-17 DIAGNOSIS — M4802 Spinal stenosis, cervical region: Secondary | ICD-10-CM | POA: Diagnosis not present

## 2018-11-17 DIAGNOSIS — G9520 Unspecified cord compression: Secondary | ICD-10-CM | POA: Diagnosis not present

## 2018-11-20 DIAGNOSIS — M25561 Pain in right knee: Secondary | ICD-10-CM | POA: Diagnosis not present

## 2018-11-20 DIAGNOSIS — M1711 Unilateral primary osteoarthritis, right knee: Secondary | ICD-10-CM | POA: Diagnosis not present

## 2018-12-03 DIAGNOSIS — M1711 Unilateral primary osteoarthritis, right knee: Secondary | ICD-10-CM | POA: Diagnosis not present

## 2018-12-10 DIAGNOSIS — M1711 Unilateral primary osteoarthritis, right knee: Secondary | ICD-10-CM | POA: Diagnosis not present

## 2018-12-10 DIAGNOSIS — M25561 Pain in right knee: Secondary | ICD-10-CM | POA: Diagnosis not present

## 2018-12-17 DIAGNOSIS — M1711 Unilateral primary osteoarthritis, right knee: Secondary | ICD-10-CM | POA: Diagnosis not present

## 2018-12-29 DIAGNOSIS — M4802 Spinal stenosis, cervical region: Secondary | ICD-10-CM | POA: Diagnosis not present

## 2018-12-29 DIAGNOSIS — M47812 Spondylosis without myelopathy or radiculopathy, cervical region: Secondary | ICD-10-CM | POA: Diagnosis not present

## 2018-12-29 DIAGNOSIS — G9520 Unspecified cord compression: Secondary | ICD-10-CM | POA: Diagnosis not present

## 2018-12-29 DIAGNOSIS — M5030 Other cervical disc degeneration, unspecified cervical region: Secondary | ICD-10-CM | POA: Diagnosis not present

## 2019-01-01 DIAGNOSIS — Z01818 Encounter for other preprocedural examination: Secondary | ICD-10-CM | POA: Diagnosis not present

## 2019-01-26 DIAGNOSIS — M4802 Spinal stenosis, cervical region: Secondary | ICD-10-CM | POA: Diagnosis not present

## 2019-01-26 DIAGNOSIS — Z09 Encounter for follow-up examination after completed treatment for conditions other than malignant neoplasm: Secondary | ICD-10-CM | POA: Diagnosis not present

## 2019-01-26 DIAGNOSIS — M47812 Spondylosis without myelopathy or radiculopathy, cervical region: Secondary | ICD-10-CM | POA: Diagnosis not present

## 2019-01-26 DIAGNOSIS — M5030 Other cervical disc degeneration, unspecified cervical region: Secondary | ICD-10-CM | POA: Diagnosis not present

## 2019-01-26 DIAGNOSIS — G9520 Unspecified cord compression: Secondary | ICD-10-CM | POA: Diagnosis not present

## 2019-01-26 DIAGNOSIS — M5412 Radiculopathy, cervical region: Secondary | ICD-10-CM | POA: Diagnosis not present

## 2019-02-23 DIAGNOSIS — M5412 Radiculopathy, cervical region: Secondary | ICD-10-CM | POA: Diagnosis not present

## 2019-02-23 DIAGNOSIS — Z09 Encounter for follow-up examination after completed treatment for conditions other than malignant neoplasm: Secondary | ICD-10-CM | POA: Diagnosis not present

## 2019-02-23 DIAGNOSIS — M5030 Other cervical disc degeneration, unspecified cervical region: Secondary | ICD-10-CM | POA: Diagnosis not present

## 2019-02-23 DIAGNOSIS — G9520 Unspecified cord compression: Secondary | ICD-10-CM | POA: Diagnosis not present

## 2019-02-23 DIAGNOSIS — M47812 Spondylosis without myelopathy or radiculopathy, cervical region: Secondary | ICD-10-CM | POA: Diagnosis not present

## 2019-02-23 DIAGNOSIS — M4802 Spinal stenosis, cervical region: Secondary | ICD-10-CM | POA: Diagnosis not present

## 2019-02-24 DIAGNOSIS — I10 Essential (primary) hypertension: Secondary | ICD-10-CM | POA: Diagnosis not present

## 2019-02-24 DIAGNOSIS — E7849 Other hyperlipidemia: Secondary | ICD-10-CM | POA: Diagnosis not present

## 2019-02-24 DIAGNOSIS — E042 Nontoxic multinodular goiter: Secondary | ICD-10-CM | POA: Diagnosis not present

## 2019-02-26 DIAGNOSIS — R82998 Other abnormal findings in urine: Secondary | ICD-10-CM | POA: Diagnosis not present

## 2019-03-02 DIAGNOSIS — E785 Hyperlipidemia, unspecified: Secondary | ICD-10-CM | POA: Diagnosis not present

## 2019-03-02 DIAGNOSIS — I10 Essential (primary) hypertension: Secondary | ICD-10-CM | POA: Diagnosis not present

## 2019-03-02 DIAGNOSIS — E042 Nontoxic multinodular goiter: Secondary | ICD-10-CM | POA: Diagnosis not present

## 2019-03-02 DIAGNOSIS — Z0000 Encounter for general adult medical examination without abnormal findings: Secondary | ICD-10-CM | POA: Diagnosis not present

## 2019-03-02 DIAGNOSIS — I77819 Aortic ectasia, unspecified site: Secondary | ICD-10-CM | POA: Diagnosis not present

## 2019-03-02 DIAGNOSIS — M069 Rheumatoid arthritis, unspecified: Secondary | ICD-10-CM | POA: Diagnosis not present

## 2019-03-02 DIAGNOSIS — M1990 Unspecified osteoarthritis, unspecified site: Secondary | ICD-10-CM | POA: Diagnosis not present

## 2019-03-02 DIAGNOSIS — G43009 Migraine without aura, not intractable, without status migrainosus: Secondary | ICD-10-CM | POA: Diagnosis not present

## 2019-03-02 DIAGNOSIS — Z1331 Encounter for screening for depression: Secondary | ICD-10-CM | POA: Diagnosis not present

## 2019-03-02 DIAGNOSIS — I6529 Occlusion and stenosis of unspecified carotid artery: Secondary | ICD-10-CM | POA: Diagnosis not present

## 2019-03-02 DIAGNOSIS — M797 Fibromyalgia: Secondary | ICD-10-CM | POA: Diagnosis not present

## 2019-03-02 DIAGNOSIS — M5010 Cervical disc disorder with radiculopathy, unspecified cervical region: Secondary | ICD-10-CM | POA: Diagnosis not present

## 2019-03-23 DIAGNOSIS — M5412 Radiculopathy, cervical region: Secondary | ICD-10-CM | POA: Diagnosis not present

## 2019-03-23 DIAGNOSIS — Z09 Encounter for follow-up examination after completed treatment for conditions other than malignant neoplasm: Secondary | ICD-10-CM | POA: Diagnosis not present

## 2019-03-23 DIAGNOSIS — Z4789 Encounter for other orthopedic aftercare: Secondary | ICD-10-CM | POA: Diagnosis not present

## 2019-03-23 DIAGNOSIS — M47812 Spondylosis without myelopathy or radiculopathy, cervical region: Secondary | ICD-10-CM | POA: Diagnosis not present

## 2019-03-23 DIAGNOSIS — Z981 Arthrodesis status: Secondary | ICD-10-CM | POA: Diagnosis not present

## 2019-03-23 DIAGNOSIS — M5030 Other cervical disc degeneration, unspecified cervical region: Secondary | ICD-10-CM | POA: Diagnosis not present

## 2019-03-23 DIAGNOSIS — M4802 Spinal stenosis, cervical region: Secondary | ICD-10-CM | POA: Diagnosis not present

## 2019-03-23 DIAGNOSIS — G9520 Unspecified cord compression: Secondary | ICD-10-CM | POA: Diagnosis not present

## 2019-04-07 DIAGNOSIS — L02211 Cutaneous abscess of abdominal wall: Secondary | ICD-10-CM | POA: Diagnosis not present

## 2019-04-07 DIAGNOSIS — I10 Essential (primary) hypertension: Secondary | ICD-10-CM | POA: Diagnosis not present

## 2019-04-07 DIAGNOSIS — D229 Melanocytic nevi, unspecified: Secondary | ICD-10-CM | POA: Diagnosis not present

## 2019-04-16 DIAGNOSIS — H2513 Age-related nuclear cataract, bilateral: Secondary | ICD-10-CM | POA: Diagnosis not present

## 2019-04-16 DIAGNOSIS — H25013 Cortical age-related cataract, bilateral: Secondary | ICD-10-CM | POA: Diagnosis not present

## 2019-04-16 DIAGNOSIS — H18413 Arcus senilis, bilateral: Secondary | ICD-10-CM | POA: Diagnosis not present

## 2019-04-16 DIAGNOSIS — H25043 Posterior subcapsular polar age-related cataract, bilateral: Secondary | ICD-10-CM | POA: Diagnosis not present

## 2019-04-16 DIAGNOSIS — H2512 Age-related nuclear cataract, left eye: Secondary | ICD-10-CM | POA: Diagnosis not present

## 2019-04-30 DIAGNOSIS — L905 Scar conditions and fibrosis of skin: Secondary | ICD-10-CM | POA: Diagnosis not present

## 2019-04-30 DIAGNOSIS — L720 Epidermal cyst: Secondary | ICD-10-CM | POA: Diagnosis not present

## 2019-04-30 DIAGNOSIS — L821 Other seborrheic keratosis: Secondary | ICD-10-CM | POA: Diagnosis not present

## 2019-08-31 DIAGNOSIS — I77819 Aortic ectasia, unspecified site: Secondary | ICD-10-CM | POA: Diagnosis not present

## 2019-08-31 DIAGNOSIS — I10 Essential (primary) hypertension: Secondary | ICD-10-CM | POA: Diagnosis not present

## 2019-08-31 DIAGNOSIS — E785 Hyperlipidemia, unspecified: Secondary | ICD-10-CM | POA: Diagnosis not present

## 2019-08-31 DIAGNOSIS — M069 Rheumatoid arthritis, unspecified: Secondary | ICD-10-CM | POA: Diagnosis not present

## 2019-08-31 DIAGNOSIS — M797 Fibromyalgia: Secondary | ICD-10-CM | POA: Diagnosis not present

## 2019-08-31 DIAGNOSIS — G43009 Migraine without aura, not intractable, without status migrainosus: Secondary | ICD-10-CM | POA: Diagnosis not present

## 2019-08-31 DIAGNOSIS — I6529 Occlusion and stenosis of unspecified carotid artery: Secondary | ICD-10-CM | POA: Diagnosis not present

## 2019-08-31 DIAGNOSIS — E042 Nontoxic multinodular goiter: Secondary | ICD-10-CM | POA: Diagnosis not present

## 2019-08-31 DIAGNOSIS — E041 Nontoxic single thyroid nodule: Secondary | ICD-10-CM | POA: Diagnosis not present

## 2019-08-31 DIAGNOSIS — M5010 Cervical disc disorder with radiculopathy, unspecified cervical region: Secondary | ICD-10-CM | POA: Diagnosis not present

## 2019-08-31 DIAGNOSIS — M1990 Unspecified osteoarthritis, unspecified site: Secondary | ICD-10-CM | POA: Diagnosis not present

## 2019-09-10 DIAGNOSIS — I10 Essential (primary) hypertension: Secondary | ICD-10-CM | POA: Diagnosis not present

## 2019-09-24 DIAGNOSIS — I10 Essential (primary) hypertension: Secondary | ICD-10-CM | POA: Diagnosis not present

## 2019-09-24 DIAGNOSIS — E876 Hypokalemia: Secondary | ICD-10-CM | POA: Diagnosis not present

## 2019-10-01 DIAGNOSIS — I10 Essential (primary) hypertension: Secondary | ICD-10-CM | POA: Diagnosis not present

## 2019-10-20 DIAGNOSIS — M25561 Pain in right knee: Secondary | ICD-10-CM | POA: Diagnosis not present

## 2019-10-20 DIAGNOSIS — M1711 Unilateral primary osteoarthritis, right knee: Secondary | ICD-10-CM | POA: Diagnosis not present

## 2019-10-22 DIAGNOSIS — I10 Essential (primary) hypertension: Secondary | ICD-10-CM | POA: Diagnosis not present

## 2019-10-22 DIAGNOSIS — K5900 Constipation, unspecified: Secondary | ICD-10-CM | POA: Diagnosis not present

## 2019-10-22 DIAGNOSIS — E876 Hypokalemia: Secondary | ICD-10-CM | POA: Diagnosis not present

## 2019-10-27 DIAGNOSIS — M25561 Pain in right knee: Secondary | ICD-10-CM | POA: Diagnosis not present

## 2019-10-27 DIAGNOSIS — M1711 Unilateral primary osteoarthritis, right knee: Secondary | ICD-10-CM | POA: Diagnosis not present

## 2019-11-03 DIAGNOSIS — M25561 Pain in right knee: Secondary | ICD-10-CM | POA: Diagnosis not present

## 2019-11-03 DIAGNOSIS — M1711 Unilateral primary osteoarthritis, right knee: Secondary | ICD-10-CM | POA: Diagnosis not present

## 2020-01-01 DIAGNOSIS — M1711 Unilateral primary osteoarthritis, right knee: Secondary | ICD-10-CM | POA: Diagnosis not present

## 2020-02-24 ENCOUNTER — Other Ambulatory Visit (HOSPITAL_COMMUNITY): Payer: Self-pay | Admitting: Interventional Radiology

## 2020-02-24 ENCOUNTER — Telehealth (HOSPITAL_COMMUNITY): Payer: Self-pay

## 2020-02-24 NOTE — Telephone Encounter (Signed)
Called to schedule cta head/neck, no answer, left vm. AW 

## 2020-02-25 ENCOUNTER — Other Ambulatory Visit (HOSPITAL_COMMUNITY): Payer: Self-pay | Admitting: Interventional Radiology

## 2020-02-25 DIAGNOSIS — I671 Cerebral aneurysm, nonruptured: Secondary | ICD-10-CM

## 2020-02-26 DIAGNOSIS — E041 Nontoxic single thyroid nodule: Secondary | ICD-10-CM | POA: Diagnosis not present

## 2020-02-26 DIAGNOSIS — E7849 Other hyperlipidemia: Secondary | ICD-10-CM | POA: Diagnosis not present

## 2020-02-29 DIAGNOSIS — R82998 Other abnormal findings in urine: Secondary | ICD-10-CM | POA: Diagnosis not present

## 2020-02-29 DIAGNOSIS — I10 Essential (primary) hypertension: Secondary | ICD-10-CM | POA: Diagnosis not present

## 2020-03-02 ENCOUNTER — Ambulatory Visit (HOSPITAL_COMMUNITY)
Admission: RE | Admit: 2020-03-02 | Discharge: 2020-03-02 | Disposition: A | Discharge status: Home / Self Care | Payer: Medicare Other | Source: Ambulatory Visit | Attending: Interventional Radiology | Admitting: Interventional Radiology

## 2020-03-02 ENCOUNTER — Other Ambulatory Visit: Payer: Self-pay

## 2020-03-02 DIAGNOSIS — I720 Aneurysm of carotid artery: Secondary | ICD-10-CM | POA: Diagnosis not present

## 2020-03-02 DIAGNOSIS — I671 Cerebral aneurysm, nonruptured: Secondary | ICD-10-CM | POA: Insufficient documentation

## 2020-03-02 LAB — POCT I-STAT CREATININE: Creatinine, Ser: 0.8 mg/dL (ref 0.44–1.00)

## 2020-03-02 MED ORDER — IOHEXOL 350 MG/ML SOLN
100.0000 mL | Freq: Once | INTRAVENOUS | Status: AC | PRN
  Administered 2020-03-02: 100 mL via INTRAVENOUS

## 2020-03-07 DIAGNOSIS — E785 Hyperlipidemia, unspecified: Secondary | ICD-10-CM | POA: Diagnosis not present

## 2020-03-07 DIAGNOSIS — E041 Nontoxic single thyroid nodule: Secondary | ICD-10-CM | POA: Diagnosis not present

## 2020-03-07 DIAGNOSIS — I77819 Aortic ectasia, unspecified site: Secondary | ICD-10-CM | POA: Diagnosis not present

## 2020-03-07 DIAGNOSIS — I10 Essential (primary) hypertension: Secondary | ICD-10-CM | POA: Diagnosis not present

## 2020-03-07 DIAGNOSIS — M5010 Cervical disc disorder with radiculopathy, unspecified cervical region: Secondary | ICD-10-CM | POA: Diagnosis not present

## 2020-03-07 DIAGNOSIS — I6529 Occlusion and stenosis of unspecified carotid artery: Secondary | ICD-10-CM | POA: Diagnosis not present

## 2020-03-07 DIAGNOSIS — Z0000 Encounter for general adult medical examination without abnormal findings: Secondary | ICD-10-CM | POA: Diagnosis not present

## 2020-03-07 DIAGNOSIS — J309 Allergic rhinitis, unspecified: Secondary | ICD-10-CM | POA: Diagnosis not present

## 2020-03-07 DIAGNOSIS — M069 Rheumatoid arthritis, unspecified: Secondary | ICD-10-CM | POA: Diagnosis not present

## 2020-03-07 DIAGNOSIS — M1990 Unspecified osteoarthritis, unspecified site: Secondary | ICD-10-CM | POA: Diagnosis not present

## 2020-03-07 DIAGNOSIS — E669 Obesity, unspecified: Secondary | ICD-10-CM | POA: Diagnosis not present

## 2020-03-08 ENCOUNTER — Telehealth (HOSPITAL_COMMUNITY): Payer: Self-pay

## 2020-03-08 NOTE — Telephone Encounter (Signed)
Called pt regarding recent cta, no answer, vm full. AW

## 2020-03-25 DIAGNOSIS — M069 Rheumatoid arthritis, unspecified: Secondary | ICD-10-CM | POA: Diagnosis not present

## 2020-03-25 DIAGNOSIS — I10 Essential (primary) hypertension: Secondary | ICD-10-CM | POA: Diagnosis not present

## 2020-03-25 DIAGNOSIS — R5381 Other malaise: Secondary | ICD-10-CM | POA: Diagnosis not present

## 2020-09-05 DIAGNOSIS — H8110 Benign paroxysmal vertigo, unspecified ear: Secondary | ICD-10-CM | POA: Diagnosis not present

## 2020-09-05 DIAGNOSIS — I10 Essential (primary) hypertension: Secondary | ICD-10-CM | POA: Diagnosis not present

## 2020-09-05 DIAGNOSIS — M797 Fibromyalgia: Secondary | ICD-10-CM | POA: Diagnosis not present

## 2020-09-05 DIAGNOSIS — E042 Nontoxic multinodular goiter: Secondary | ICD-10-CM | POA: Diagnosis not present

## 2020-09-05 DIAGNOSIS — E041 Nontoxic single thyroid nodule: Secondary | ICD-10-CM | POA: Diagnosis not present

## 2020-09-05 DIAGNOSIS — E669 Obesity, unspecified: Secondary | ICD-10-CM | POA: Diagnosis not present

## 2020-09-05 DIAGNOSIS — Z79899 Other long term (current) drug therapy: Secondary | ICD-10-CM | POA: Diagnosis not present

## 2020-09-05 DIAGNOSIS — M069 Rheumatoid arthritis, unspecified: Secondary | ICD-10-CM | POA: Diagnosis not present

## 2020-10-07 DIAGNOSIS — M79621 Pain in right upper arm: Secondary | ICD-10-CM | POA: Diagnosis not present

## 2020-10-07 DIAGNOSIS — M66829 Spontaneous rupture of other tendons, unspecified upper arm: Secondary | ICD-10-CM | POA: Diagnosis not present

## 2020-10-18 DIAGNOSIS — M79621 Pain in right upper arm: Secondary | ICD-10-CM | POA: Diagnosis not present

## 2020-10-18 DIAGNOSIS — M66829 Spontaneous rupture of other tendons, unspecified upper arm: Secondary | ICD-10-CM | POA: Diagnosis not present

## 2020-11-03 DIAGNOSIS — M1711 Unilateral primary osteoarthritis, right knee: Secondary | ICD-10-CM | POA: Diagnosis not present

## 2020-11-08 DIAGNOSIS — M1711 Unilateral primary osteoarthritis, right knee: Secondary | ICD-10-CM | POA: Diagnosis not present

## 2020-11-16 DIAGNOSIS — M1711 Unilateral primary osteoarthritis, right knee: Secondary | ICD-10-CM | POA: Diagnosis not present

## 2020-11-22 DIAGNOSIS — M79621 Pain in right upper arm: Secondary | ICD-10-CM | POA: Diagnosis not present

## 2020-12-01 DIAGNOSIS — M75101 Unspecified rotator cuff tear or rupture of right shoulder, not specified as traumatic: Secondary | ICD-10-CM | POA: Diagnosis not present

## 2020-12-01 DIAGNOSIS — S46111A Strain of muscle, fascia and tendon of long head of biceps, right arm, initial encounter: Secondary | ICD-10-CM | POA: Diagnosis not present

## 2021-02-13 ENCOUNTER — Other Ambulatory Visit (HOSPITAL_COMMUNITY): Payer: Self-pay | Admitting: Interventional Radiology

## 2021-02-14 ENCOUNTER — Other Ambulatory Visit (HOSPITAL_COMMUNITY): Payer: Self-pay | Admitting: Interventional Radiology

## 2021-02-14 DIAGNOSIS — I671 Cerebral aneurysm, nonruptured: Secondary | ICD-10-CM

## 2021-02-14 DIAGNOSIS — H9312 Tinnitus, left ear: Secondary | ICD-10-CM

## 2021-02-14 DIAGNOSIS — R42 Dizziness and giddiness: Secondary | ICD-10-CM

## 2021-02-15 ENCOUNTER — Other Ambulatory Visit: Payer: Self-pay

## 2021-02-15 ENCOUNTER — Ambulatory Visit (HOSPITAL_COMMUNITY)
Admission: RE | Admit: 2021-02-15 | Discharge: 2021-02-15 | Disposition: A | Discharge status: Home / Self Care | Payer: Medicare Other | Source: Ambulatory Visit | Attending: Interventional Radiology | Admitting: Interventional Radiology

## 2021-02-15 ENCOUNTER — Telehealth (HOSPITAL_COMMUNITY): Payer: Self-pay

## 2021-02-15 DIAGNOSIS — I671 Cerebral aneurysm, nonruptured: Secondary | ICD-10-CM | POA: Insufficient documentation

## 2021-02-15 DIAGNOSIS — R42 Dizziness and giddiness: Secondary | ICD-10-CM | POA: Insufficient documentation

## 2021-02-15 DIAGNOSIS — H9312 Tinnitus, left ear: Secondary | ICD-10-CM | POA: Diagnosis not present

## 2021-02-15 LAB — POCT I-STAT CREATININE: Creatinine, Ser: 0.7 mg/dL (ref 0.44–1.00)

## 2021-02-15 MED ORDER — IOHEXOL 350 MG/ML SOLN
75.0000 mL | Freq: Once | INTRAVENOUS | Status: AC | PRN
  Administered 2021-02-15: 75 mL via INTRAVENOUS

## 2021-02-15 NOTE — Telephone Encounter (Signed)
Returned pt's call, no answer, left vm. AW  

## 2021-02-16 ENCOUNTER — Telehealth (HOSPITAL_COMMUNITY): Payer: Self-pay

## 2021-02-16 NOTE — Telephone Encounter (Signed)
Pt agreed to f/u in 1 year with cta head/neck. She will f/u with neurology if her sx persist. AW

## 2021-03-06 DIAGNOSIS — E041 Nontoxic single thyroid nodule: Secondary | ICD-10-CM | POA: Diagnosis not present

## 2021-03-06 DIAGNOSIS — E785 Hyperlipidemia, unspecified: Secondary | ICD-10-CM | POA: Diagnosis not present

## 2021-03-06 DIAGNOSIS — I10 Essential (primary) hypertension: Secondary | ICD-10-CM | POA: Diagnosis not present

## 2021-03-13 DIAGNOSIS — Z0000 Encounter for general adult medical examination without abnormal findings: Secondary | ICD-10-CM | POA: Diagnosis not present

## 2021-03-13 DIAGNOSIS — I77819 Aortic ectasia, unspecified site: Secondary | ICD-10-CM | POA: Diagnosis not present

## 2021-03-13 DIAGNOSIS — Z1331 Encounter for screening for depression: Secondary | ICD-10-CM | POA: Diagnosis not present

## 2021-03-13 DIAGNOSIS — Z1212 Encounter for screening for malignant neoplasm of rectum: Secondary | ICD-10-CM | POA: Diagnosis not present

## 2021-03-13 DIAGNOSIS — I10 Essential (primary) hypertension: Secondary | ICD-10-CM | POA: Diagnosis not present

## 2021-03-13 DIAGNOSIS — M1990 Unspecified osteoarthritis, unspecified site: Secondary | ICD-10-CM | POA: Diagnosis not present

## 2021-03-13 DIAGNOSIS — E876 Hypokalemia: Secondary | ICD-10-CM | POA: Diagnosis not present

## 2021-03-13 DIAGNOSIS — T466X5A Adverse effect of antihyperlipidemic and antiarteriosclerotic drugs, initial encounter: Secondary | ICD-10-CM | POA: Diagnosis not present

## 2021-03-13 DIAGNOSIS — G720 Drug-induced myopathy: Secondary | ICD-10-CM | POA: Diagnosis not present

## 2021-03-13 DIAGNOSIS — M069 Rheumatoid arthritis, unspecified: Secondary | ICD-10-CM | POA: Diagnosis not present

## 2021-03-13 DIAGNOSIS — Z1339 Encounter for screening examination for other mental health and behavioral disorders: Secondary | ICD-10-CM | POA: Diagnosis not present

## 2021-03-13 DIAGNOSIS — R82998 Other abnormal findings in urine: Secondary | ICD-10-CM | POA: Diagnosis not present

## 2021-03-13 DIAGNOSIS — M797 Fibromyalgia: Secondary | ICD-10-CM | POA: Diagnosis not present

## 2021-03-13 DIAGNOSIS — Z79899 Other long term (current) drug therapy: Secondary | ICD-10-CM | POA: Diagnosis not present

## 2021-05-25 DIAGNOSIS — M1711 Unilateral primary osteoarthritis, right knee: Secondary | ICD-10-CM | POA: Diagnosis not present

## 2021-06-08 DIAGNOSIS — M25512 Pain in left shoulder: Secondary | ICD-10-CM | POA: Diagnosis not present

## 2021-07-20 NOTE — Patient Instructions (Addendum)
DUE TO COVID-19 ONLY ONE VISITOR IS ALLOWED TO COME WITH YOU AND STAY IN THE WAITING ROOM ONLY DURING PRE OP AND PROCEDURE.   **NO VISITORS ARE ALLOWED IN THE SHORT STAY AREA OR RECOVERY ROOM!!**  IF YOU WILL BE ADMITTED INTO THE HOSPITAL YOU ARE ALLOWED ONLY TWO SUPPORT PEOPLE DURING VISITATION HOURS ONLY (10AM -8PM)   The support person(s) may change daily. The support person(s) must pass our screening, gel in and out, and wear a mask at all times, including in the patient's room. Patients must also wear a mask when staff or their support person are in the room.  No visitors under the age of 68. Any visitor under the age of 16 must be accompanied by an adult.   COVID SWAB TESTING MUST BE COMPLETED ON:  Thursday, 08-03-21 Between the hours of 8 and 3  **MUST PRESENT COMPLETED FORM AT TESTING SITE**    [place] [place] (backside of the building)  You are not required to quarantine, however you are required to wear a well-fitted mask when you are out and around people not in your household.  Hand Hygiene often Do NOT share personal items Notify your provider if you are in close contact with someone who has COVID or you develop fever 100.4 or greater, new onset of sneezing, cough, sore throat, shortness of breath or body aches.         Your procedure is scheduled on:  Monday, 08-07-21   Report to [facility] Main  Entrance    Report to admitting at 6:50 AM   Call this number if you have problems the morning of surgery [phone]   Do not eat food :After Midnight.   May have liquids until 6:30 AM  day of surgery  CLEAR LIQUID DIET  Foods Allowed                                                                     Foods Excluded  Water, Black Coffee (no milk/no creamer) and tea, regular and decaf                              liquids that you cannot  Plain Jell-O in any flavor  (No red)                         see through such as: Fruit ices (not with  fruit pulp)                                 milk, soups, orange juice  Iced Popsicles (No red)                                    All solid food                             Apple juices Sports drinks like Gatorade (No red) Lightly seasoned clear broth or consume(fat free) Sugar    Complete  one Ensure drink the morning of surgery at 6:30 AM the day of surgery.      The day of surgery:  Drink ONE (1) Pre-Surgery Clear Ensure the morning of surgery. Drink in one sitting. Do not sip.  This drink was given to you during your hospital  pre-op appointment visit. Nothing else to drink after completing the Pre-Surgery Clear Ensure.          If you have questions, please contact your surgeon's office.     Oral Hygiene is also important to reduce your risk of infection.                                    Remember - BRUSH YOUR TEETH THE MORNING OF SURGERY WITH YOUR REGULAR TOOTHPASTE   Do NOT smoke after Midnight   Take these medicines the morning of surgery with A SIP OF WATER:  None                        You may not have any metal on your body including hair pins, jewelry, and body piercing             Do not wear make-up, lotions, powders, perfumes or deodorant  Do not wear nail polish including gel and S&S, artificial/acrylic nails, or any other type of covering on natural nails including finger and toenails. If you have artificial nails, gel coating, etc. that needs to be removed by a nail salon please have this removed prior to surgery or surgery may need to be canceled/ delayed if the surgeon/ anesthesia feels like they are unable to be safely monitored.   Do not shave  48 hours prior to surgery.   Do not bring valuables to the hospital. [place].   Contacts, dentures or bridgework may not be worn into surgery.   Bring small overnight bag day of surgery.   Special Instructions: Bring a copy of your healthcare power of attorney and living will  documents the day of surgery if you haven't scanned them in before.  Please read over the following fact sheets you were given: IF YOU HAVE QUESTIONS ABOUT YOUR PRE OP INSTRUCTIONS PLEASE CALL [facility] - Preparing for Surgery Before surgery, you can play an important role.  Because skin is not sterile, your skin needs to be as free of germs as possible.  You can reduce the number of germs on your skin by washing with CHG (chlorahexidine gluconate) soap before surgery.  CHG is an antiseptic cleaner which kills germs and bonds with the skin to continue killing germs even after washing. Please DO NOT use if you have an allergy to CHG or antibacterial soaps.  If your skin becomes reddened/irritated stop using the CHG and inform your nurse when you arrive at Short Stay. Do not shave (including legs and underarms) for at least 48 hours prior to the first CHG shower.  You may shave your face/neck.  Please follow these instructions carefully:  1.  Shower with CHG Soap the night before surgery and the  morning of surgery.  2.  If you choose to wash your hair, wash your hair first as usual with your normal  shampoo.  3.  After you shampoo, rinse your hair and body thoroughly to remove the shampoo.  4.  Use CHG as you would any other liquid soap.  You can apply chg directly to the skin and wash.  Gently with a scrungie or clean washcloth.  5.  Apply the CHG Soap to your body ONLY FROM THE NECK DOWN.   Do   not use on face/ open                           Wound or open sores. Avoid contact with eyes, ears mouth and   genitals (private parts).                       Wash face,  Genitals (private parts) with your normal soap.             6.  Wash thoroughly, paying special attention to the area where your    surgery  will be performed.  7.  Thoroughly rinse your body with warm water from the neck down.  8.  DO NOT shower/wash with your normal soap after using and  rinsing off the CHG Soap.                9.  Pat yourself dry with a clean towel.            10.  Wear clean pajamas.            11.  Place clean sheets on your bed the night of your first shower and do not  sleep with pets. Day of Surgery : Do not apply any lotions/deodorants the morning of surgery.  Please wear clean clothes to the hospital/surgery center.  FAILURE TO FOLLOW THESE INSTRUCTIONS MAY RESULT IN THE CANCELLATION OF YOUR SURGERY  PATIENT SIGNATURE_________________________________  NURSE SIGNATURE__________________________________  ________________________________________________________________________   Adam Phenix  An incentive spirometer is a tool that can help keep your lungs clear and active. This tool measures how well you are filling your lungs with each breath. Taking long deep breaths may help reverse or decrease the chance of developing breathing (pulmonary) problems (especially infection) following: A long period of time when you are unable to move or be active. BEFORE THE PROCEDURE  If the spirometer includes an indicator to show your best effort, your nurse or respiratory therapist will set it to a desired goal. If possible, sit up straight or lean slightly forward. Try not to slouch. Hold the incentive spirometer in an upright position. INSTRUCTIONS FOR USE  Sit on the edge of your bed if possible, or sit up as far as you can in bed or on a chair. Hold the incentive spirometer in an upright position. Breathe out normally. Place the mouthpiece in your mouth and seal your lips tightly around it. Breathe in slowly and as deeply as possible, raising the piston or the ball toward the top of the column. Hold your breath for 3-5 seconds or for as long as possible. Allow the piston or ball to fall to the bottom of the column. Remove the mouthpiece from your mouth and breathe out normally. Rest for a few seconds and repeat Steps 1 through 7 at least 10 times  every 1-2 hours when you are awake. Take your time and take a few normal breaths between deep breaths. The spirometer may include an indicator to show your best effort. Use the indicator as a goal to work toward during each repetition. After each set of 10 deep breaths, practice coughing to be sure  your lungs are clear. If you have an incision (the cut made at the time of surgery), support your incision when coughing by placing a pillow or rolled up towels firmly against it. Once you are able to get out of bed, walk around indoors and cough well. You may stop using the incentive spirometer when instructed by your caregiver.  RISKS AND COMPLICATIONS Take your time so you do not get dizzy or light-headed. If you are in pain, you may need to take or ask for pain medication before doing incentive spirometry. It is harder to take a deep breath if you are having pain. AFTER USE Rest and breathe slowly and easily. It can be helpful to keep track of a log of your progress. Your caregiver can provide you with a simple table to help with this. If you are using the spirometer at home, follow these instructions: [place] IF:  You are having difficultly using the spirometer. You have trouble using the spirometer as often as instructed. Your pain medication is not giving enough relief while using the spirometer. You develop fever of 100.5 F (38.1 C) or higher. SEEK IMMEDIATE MEDICAL CARE IF:  You cough up bloody sputum that had not been present before. You develop fever of 102 F (38.9 C) or greater. You develop worsening pain at or near the incision site. MAKE SURE YOU:  Understand these instructions. Will watch your condition. Will get help right away if you are not doing well or get worse. Document Released: 03/18/2007 Document Revised: 01/28/2012 Document Reviewed: 05/19/2007 [facility] Patient Information 2014 [place],  [place].   ________________________________________________________________________

## 2021-07-20 NOTE — Progress Notes (Addendum)
COVID swab appointment:  08-03-21  COVID Vaccine Completed:  Yes x4 Date COVID Vaccine completed: Has received booster:  Yes x2 COVID vaccine manufacturer:    Moderna     Date of COVID positive in last 90 days:  No  PCP - Burnard Bunting, MD. Office note on chart with medical clearance  Cardiologist - Pearline Cables, MD last Donald 2013 evaluation for dizziness.  Determined to be vertigo  Chest x-ray - N/A EKG - 07-25-21 Epic Stress Test - greater than 5 years ECHO - N/A Cardiac Cath - N/A Pacemaker/ICD device last checked: Spinal Cord Stimulator:  Sleep Study - yes, +sleep apnea CPAP - No, stopped using a few years ago  Fasting Blood Sugar - Does not check for prediabetes Checks Blood Sugar _____ times a day  Blood Thinner Instructions: Aspirin Instructions:  ASA 81 mg.  To stop 7 days preop  Last Dose:  Activity level:  Can go up a flight of stairs and perform activities of daily living without stopping and without symptoms of chest pain or shortness of breath.      Anesthesia review:  Carotid artery aneurysm, HTN, pre diabetes  Patient denies shortness of breath, fever, cough and chest pain at PAT appointment   Patient verbalized understanding of instructions that were given to them at the PAT appointment. Patient was also instructed that they will need to review over the PAT instructions again at home before surgery.

## 2021-07-25 ENCOUNTER — Encounter (HOSPITAL_COMMUNITY): Payer: Self-pay

## 2021-07-25 ENCOUNTER — Other Ambulatory Visit: Payer: Self-pay

## 2021-07-25 ENCOUNTER — Encounter (HOSPITAL_COMMUNITY)
Admission: RE | Admit: 2021-07-25 | Discharge: 2021-07-25 | Disposition: A | Discharge status: Home / Self Care | Payer: Medicare Other | Source: Ambulatory Visit | Attending: Orthopedic Surgery | Admitting: Orthopedic Surgery

## 2021-07-25 DIAGNOSIS — Z01818 Encounter for other preprocedural examination: Secondary | ICD-10-CM | POA: Diagnosis not present

## 2021-07-25 HISTORY — DX: Prediabetes: R73.03

## 2021-07-25 HISTORY — DX: Unspecified osteoarthritis, unspecified site: M19.90

## 2021-07-25 LAB — COMPREHENSIVE METABOLIC PANEL
ALT: 20 U/L (ref 0–44)
AST: 19 U/L (ref 15–41)
Albumin: 3.8 g/dL (ref 3.5–5.0)
Alkaline Phosphatase: 67 U/L (ref 38–126)
Anion gap: 7 (ref 5–15)
BUN: 15 mg/dL (ref 8–23)
CO2: 29 mmol/L (ref 22–32)
Calcium: 10.8 mg/dL — ABNORMAL HIGH (ref 8.9–10.3)
Chloride: 104 mmol/L (ref 98–111)
Creatinine, Ser: 0.79 mg/dL (ref 0.44–1.00)
GFR, Estimated: >60 mL/min (ref >60)
Glucose, Bld: 112 mg/dL — ABNORMAL HIGH (ref 70–99)
Potassium: 3.4 mmol/L — ABNORMAL LOW (ref 3.5–5.1)
Sodium: 140 mmol/L (ref 135–145)
Total Bilirubin: 0.6 mg/dL (ref 0.3–1.2)
Total Protein: 7.4 g/dL (ref 6.5–8.1)

## 2021-07-25 LAB — CBC
HCT: 41.4 % (ref 36.0–46.0)
Hemoglobin: 13.4 g/dL (ref 12.0–15.0)
MCH: 30.9 pg (ref 26.0–34.0)
MCHC: 32.4 g/dL (ref 30.0–36.0)
MCV: 95.6 fL (ref 80.0–100.0)
Platelets: 244 10*3/uL (ref 150–400)
RBC: 4.33 MIL/uL (ref 3.87–5.11)
RDW: 13.8 % (ref 11.5–15.5)
WBC: 7.3 10*3/uL (ref 4.0–10.5)
nRBC: 0 % (ref 0.0–0.2)

## 2021-07-25 LAB — SURGICAL PCR SCREEN
MRSA, PCR: NEGATIVE
Staphylococcus aureus: NEGATIVE

## 2021-07-25 LAB — HEMOGLOBIN A1C
Hgb A1c MFr Bld: 5.9 % — ABNORMAL HIGH (ref 4.8–5.6)
Mean Plasma Glucose: 122.63 mg/dL

## 2021-07-25 LAB — PROTIME-INR
INR: 0.9 (ref 0.8–1.2)
Prothrombin Time: 12.6 seconds (ref 11.4–15.2)

## 2021-07-26 DIAGNOSIS — M1711 Unilateral primary osteoarthritis, right knee: Secondary | ICD-10-CM | POA: Diagnosis not present

## 2021-07-26 DIAGNOSIS — M069 Rheumatoid arthritis, unspecified: Secondary | ICD-10-CM | POA: Diagnosis not present

## 2021-07-26 DIAGNOSIS — Z01818 Encounter for other preprocedural examination: Secondary | ICD-10-CM | POA: Diagnosis not present

## 2021-07-26 DIAGNOSIS — I10 Essential (primary) hypertension: Secondary | ICD-10-CM | POA: Diagnosis not present

## 2021-07-31 DIAGNOSIS — R2232 Localized swelling, mass and lump, left upper limb: Secondary | ICD-10-CM | POA: Diagnosis not present

## 2021-07-31 DIAGNOSIS — M79602 Pain in left arm: Secondary | ICD-10-CM | POA: Diagnosis not present

## 2021-08-03 ENCOUNTER — Other Ambulatory Visit: Payer: Self-pay | Admitting: Orthopedic Surgery

## 2021-08-03 LAB — SARS CORONAVIRUS 2 (TAT 6-24 HRS): SARS Coronavirus 2: NEGATIVE

## 2021-08-04 NOTE — Anesthesia Preprocedure Evaluation (Addendum)
Anesthesia Evaluation  Patient identified by MRN, date of birth, ID band Patient awake    Reviewed: Allergy & Precautions, NPO status , Patient's Chart, lab work & pertinent test results  History of Anesthesia Complications (+) PONV, PROLONGED EMERGENCE and history of anesthetic complications  Airway Mallampati: III  TM Distance: >3 FB Neck ROM: Full    Dental  (+) Teeth Intact, Dental Advisory Given   Pulmonary shortness of breath and with exertion, sleep apnea (non-compliant w/ CPAP) ,    Pulmonary exam normal breath sounds clear to auscultation       Cardiovascular hypertension, Pt. on medications Normal cardiovascular exam Rhythm:Regular Rate:Normal     Neuro/Psych  Headaches, Carotid aneurysm 2005 s/p coiling  negative psych ROS   GI/Hepatic negative GI ROS, Neg liver ROS,   Endo/Other  diabetes (pre-diabetic)Obesity BMI 36 a1c 5.9  Renal/GU negative Renal ROS  negative genitourinary   Musculoskeletal  (+) Arthritis , Rheumatoid disorders,    Abdominal (+) + obese,   Peds  Hematology  (+) Blood dyscrasia, anemia , hct 41.4, plt 244   Anesthesia Other Findings   Reproductive/Obstetrics negative OB ROS                          Anesthesia Physical Anesthesia Plan  ASA: 3  Anesthesia Plan: Spinal, MAC and Regional   Post-op Pain Management:  Regional for Post-op pain   Induction:   PONV Risk Score and Plan: 2 and Propofol infusion and TIVA  Airway Management Planned: Natural Airway and Nasal Cannula  Additional Equipment: None  Intra-op Plan:   Post-operative Plan:   Informed Consent: I have reviewed the patients History and Physical, chart, labs and discussed the procedure including the risks, benefits and alternatives for the proposed anesthesia with the patient or authorized representative who has indicated his/her understanding and acceptance.     Dental advisory  given  Plan Discussed with: CRNA  Anesthesia Plan Comments: (Had spinal for TKR 2015)       Anesthesia Quick Evaluation

## 2021-08-07 ENCOUNTER — Encounter (HOSPITAL_COMMUNITY): Payer: Self-pay | Admitting: Orthopedic Surgery

## 2021-08-07 ENCOUNTER — Other Ambulatory Visit: Payer: Self-pay

## 2021-08-07 ENCOUNTER — Ambulatory Visit (HOSPITAL_COMMUNITY): Payer: Medicare Other | Admitting: Certified Registered"

## 2021-08-07 ENCOUNTER — Inpatient Hospital Stay (HOSPITAL_COMMUNITY)
Admission: RE | Admit: 2021-08-07 | Discharge: 2021-08-09 | DRG: 470 | Disposition: A | Discharge status: Home / Self Care | Payer: Medicare Other | Source: Ambulatory Visit | Attending: Orthopedic Surgery | Admitting: Orthopedic Surgery

## 2021-08-07 ENCOUNTER — Ambulatory Visit (HOSPITAL_COMMUNITY): Payer: Medicare Other | Admitting: Physician Assistant

## 2021-08-07 ENCOUNTER — Encounter (HOSPITAL_COMMUNITY)
Admission: RE | Disposition: A | Discharge status: Home / Self Care | Payer: Self-pay | Source: Ambulatory Visit | Attending: Orthopedic Surgery

## 2021-08-07 DIAGNOSIS — Z820 Family history of epilepsy and other diseases of the nervous system: Secondary | ICD-10-CM

## 2021-08-07 DIAGNOSIS — Z800 Family history of malignant neoplasm of digestive organs: Secondary | ICD-10-CM

## 2021-08-07 DIAGNOSIS — Z885 Allergy status to narcotic agent status: Secondary | ICD-10-CM

## 2021-08-07 DIAGNOSIS — G4730 Sleep apnea, unspecified: Secondary | ICD-10-CM | POA: Diagnosis present

## 2021-08-07 DIAGNOSIS — Z79899 Other long term (current) drug therapy: Secondary | ICD-10-CM | POA: Diagnosis not present

## 2021-08-07 DIAGNOSIS — M8580 Other specified disorders of bone density and structure, unspecified site: Secondary | ICD-10-CM | POA: Diagnosis not present

## 2021-08-07 DIAGNOSIS — Z8041 Family history of malignant neoplasm of ovary: Secondary | ICD-10-CM

## 2021-08-07 DIAGNOSIS — R7303 Prediabetes: Secondary | ICD-10-CM | POA: Diagnosis present

## 2021-08-07 DIAGNOSIS — Z7982 Long term (current) use of aspirin: Secondary | ICD-10-CM | POA: Diagnosis not present

## 2021-08-07 DIAGNOSIS — Z8249 Family history of ischemic heart disease and other diseases of the circulatory system: Secondary | ICD-10-CM | POA: Diagnosis not present

## 2021-08-07 DIAGNOSIS — G43109 Migraine with aura, not intractable, without status migrainosus: Secondary | ICD-10-CM | POA: Diagnosis present

## 2021-08-07 DIAGNOSIS — I10 Essential (primary) hypertension: Secondary | ICD-10-CM | POA: Diagnosis not present

## 2021-08-07 DIAGNOSIS — E669 Obesity, unspecified: Secondary | ICD-10-CM | POA: Diagnosis not present

## 2021-08-07 DIAGNOSIS — Z825 Family history of asthma and other chronic lower respiratory diseases: Secondary | ICD-10-CM | POA: Diagnosis not present

## 2021-08-07 DIAGNOSIS — Z6836 Body mass index (BMI) 36.0-36.9, adult: Secondary | ICD-10-CM

## 2021-08-07 DIAGNOSIS — Z96652 Presence of left artificial knee joint: Secondary | ICD-10-CM | POA: Diagnosis not present

## 2021-08-07 DIAGNOSIS — E559 Vitamin D deficiency, unspecified: Secondary | ICD-10-CM | POA: Diagnosis present

## 2021-08-07 DIAGNOSIS — Z833 Family history of diabetes mellitus: Secondary | ICD-10-CM

## 2021-08-07 DIAGNOSIS — M069 Rheumatoid arthritis, unspecified: Secondary | ICD-10-CM | POA: Diagnosis present

## 2021-08-07 DIAGNOSIS — M1711 Unilateral primary osteoarthritis, right knee: Principal | ICD-10-CM | POA: Diagnosis present

## 2021-08-07 DIAGNOSIS — Z801 Family history of malignant neoplasm of trachea, bronchus and lung: Secondary | ICD-10-CM

## 2021-08-07 DIAGNOSIS — Z803 Family history of malignant neoplasm of breast: Secondary | ICD-10-CM

## 2021-08-07 DIAGNOSIS — G8918 Other acute postprocedural pain: Secondary | ICD-10-CM | POA: Diagnosis not present

## 2021-08-07 HISTORY — PX: TOTAL KNEE ARTHROPLASTY: SHX125

## 2021-08-07 LAB — GLUCOSE, CAPILLARY: Glucose-Capillary: 167 mg/dL — ABNORMAL HIGH (ref 70–99)

## 2021-08-07 SURGERY — ARTHROPLASTY, KNEE, TOTAL
Anesthesia: Monitor Anesthesia Care | Site: Knee | Laterality: Right

## 2021-08-07 MED ORDER — ASPIRIN EC 325 MG PO TBEC
325.0000 mg | DELAYED_RELEASE_TABLET | Freq: Two times a day (BID) | ORAL | Status: DC
  Administered 2021-08-08 – 2021-08-09 (×3): 325 mg via ORAL
  Filled 2021-08-07 (×3): qty 1

## 2021-08-07 MED ORDER — SODIUM CHLORIDE (PF) 0.9 % IJ SOLN
INTRAMUSCULAR | Status: AC
  Filled 2021-08-07: qty 10

## 2021-08-07 MED ORDER — BISACODYL 10 MG RE SUPP: 10.0000 mg | Freq: Every day | RECTAL | Status: DC | PRN

## 2021-08-07 MED ORDER — ACETAMINOPHEN 10 MG/ML IV SOLN
1000.0000 mg | Freq: Four times a day (QID) | INTRAVENOUS | Status: DC
  Administered 2021-08-07: 1000 mg via INTRAVENOUS
  Filled 2021-08-07: qty 100

## 2021-08-07 MED ORDER — FENTANYL CITRATE PF 50 MCG/ML IJ SOSY
50.0000 ug | PREFILLED_SYRINGE | INTRAMUSCULAR | Status: DC
  Administered 2021-08-07 (×2): 50 ug via INTRAVENOUS
  Filled 2021-08-07: qty 2

## 2021-08-07 MED ORDER — ONDANSETRON HCL 4 MG PO TABS: 4.0000 mg | ORAL_TABLET | Freq: Four times a day (QID) | ORAL | Status: DC | PRN

## 2021-08-07 MED ORDER — BUPIVACAINE IN DEXTROSE 0.75-8.25 % IT SOLN
INTRATHECAL | Status: DC | PRN
  Administered 2021-08-07: 1.6 mL via INTRATHECAL

## 2021-08-07 MED ORDER — BUPIVACAINE LIPOSOME 1.3 % IJ SUSP
INTRAMUSCULAR | Status: DC | PRN
  Administered 2021-08-07: 20 mL

## 2021-08-07 MED ORDER — POVIDONE-IODINE 10 % EX SWAB
2.0000 "application " | Freq: Once | CUTANEOUS | Status: AC
  Administered 2021-08-07: 2 via TOPICAL

## 2021-08-07 MED ORDER — SODIUM CHLORIDE 0.9 % IV SOLN: INTRAVENOUS | Status: DC

## 2021-08-07 MED ORDER — ROPIVACAINE HCL 5 MG/ML IJ SOLN: INTRAMUSCULAR | Status: DC | PRN

## 2021-08-07 MED ORDER — TRAMADOL HCL 50 MG PO TABS: 50.0000 mg | ORAL_TABLET | Freq: Four times a day (QID) | ORAL | Status: DC | PRN

## 2021-08-07 MED ORDER — ONDANSETRON HCL 4 MG/2ML IJ SOLN: 4.0000 mg | Freq: Once | INTRAMUSCULAR | Status: DC | PRN

## 2021-08-07 MED ORDER — MORPHINE SULFATE (PF) 2 MG/ML IV SOLN: 0.5000 mg | INTRAVENOUS | Status: DC | PRN

## 2021-08-07 MED ORDER — TRANEXAMIC ACID-NACL 1000-0.7 MG/100ML-% IV SOLN
1000.0000 mg | INTRAVENOUS | Status: AC
  Administered 2021-08-07: 1000 mg via INTRAVENOUS
  Filled 2021-08-07: qty 100

## 2021-08-07 MED ORDER — DIPHENHYDRAMINE HCL 12.5 MG/5ML PO ELIX: 12.5000 mg | ORAL_SOLUTION | ORAL | Status: DC | PRN

## 2021-08-07 MED ORDER — HYDROMORPHONE HCL 1 MG/ML IJ SOLN: 0.2500 mg | INTRAMUSCULAR | Status: DC | PRN

## 2021-08-07 MED ORDER — DEXAMETHASONE SODIUM PHOSPHATE 10 MG/ML IJ SOLN
8.0000 mg | Freq: Once | INTRAMUSCULAR | Status: AC
  Administered 2021-08-07: 5 mg via INTRAVENOUS

## 2021-08-07 MED ORDER — METOCLOPRAMIDE HCL 5 MG/ML IJ SOLN: 5.0000 mg | Freq: Three times a day (TID) | INTRAMUSCULAR | Status: DC | PRN

## 2021-08-07 MED ORDER — POTASSIUM CHLORIDE CRYS ER 20 MEQ PO TBCR
20.0000 meq | EXTENDED_RELEASE_TABLET | Freq: Every day | ORAL | Status: DC
  Administered 2021-08-08 – 2021-08-09 (×2): 20 meq via ORAL
  Filled 2021-08-07 (×2): qty 1

## 2021-08-07 MED ORDER — MENTHOL 3 MG MT LOZG: 1.0000 | LOZENGE | OROMUCOSAL | Status: DC | PRN

## 2021-08-07 MED ORDER — SODIUM CHLORIDE (PF) 0.9 % IJ SOLN
INTRAMUSCULAR | Status: DC | PRN
  Administered 2021-08-07: 60 mL

## 2021-08-07 MED ORDER — PROPOFOL 500 MG/50ML IV EMUL
INTRAVENOUS | Status: DC | PRN
  Administered 2021-08-07: 100 ug/kg/min via INTRAVENOUS

## 2021-08-07 MED ORDER — 0.9 % SODIUM CHLORIDE (POUR BTL) OPTIME
TOPICAL | Status: DC | PRN
  Administered 2021-08-07: 1000 mL

## 2021-08-07 MED ORDER — METHOCARBAMOL 500 MG PO TABS
500.0000 mg | ORAL_TABLET | Freq: Four times a day (QID) | ORAL | Status: DC | PRN
  Administered 2021-08-07 – 2021-08-09 (×5): 500 mg via ORAL
  Filled 2021-08-07 (×5): qty 1

## 2021-08-07 MED ORDER — POLYETHYLENE GLYCOL 3350 17 G PO PACK: 17.0000 g | PACK | Freq: Every day | ORAL | Status: DC | PRN

## 2021-08-07 MED ORDER — CHLORHEXIDINE GLUCONATE 0.12 % MT SOLN: 15.0000 mL | Freq: Once | OROMUCOSAL | Status: AC

## 2021-08-07 MED ORDER — LOSARTAN POTASSIUM 50 MG PO TABS
50.0000 mg | ORAL_TABLET | Freq: Every day | ORAL | Status: DC
  Administered 2021-08-08 – 2021-08-09 (×2): 50 mg via ORAL
  Filled 2021-08-07 (×2): qty 1

## 2021-08-07 MED ORDER — METOCLOPRAMIDE HCL 5 MG PO TABS: 5.0000 mg | ORAL_TABLET | Freq: Three times a day (TID) | ORAL | Status: DC | PRN

## 2021-08-07 MED ORDER — GABAPENTIN 300 MG PO CAPS
300.0000 mg | ORAL_CAPSULE | Freq: Three times a day (TID) | ORAL | Status: DC
  Administered 2021-08-07 – 2021-08-09 (×7): 300 mg via ORAL
  Filled 2021-08-07 (×7): qty 1

## 2021-08-07 MED ORDER — PHENYLEPHRINE HCL-NACL 20-0.9 MG/250ML-% IV SOLN
INTRAVENOUS | Status: DC | PRN
  Administered 2021-08-07: 30 ug/min via INTRAVENOUS

## 2021-08-07 MED ORDER — DOCUSATE SODIUM 100 MG PO CAPS
100.0000 mg | ORAL_CAPSULE | Freq: Two times a day (BID) | ORAL | Status: DC
  Administered 2021-08-07 – 2021-08-09 (×4): 100 mg via ORAL
  Filled 2021-08-07 (×4): qty 1

## 2021-08-07 MED ORDER — ROPIVACAINE HCL 5 MG/ML IJ SOLN
INTRAMUSCULAR | Status: DC | PRN
  Administered 2021-08-07: 30 mL via PERINEURAL

## 2021-08-07 MED ORDER — ORAL CARE MOUTH RINSE
15.0000 mL | Freq: Once | OROMUCOSAL | Status: AC
  Administered 2021-08-07: 15 mL via OROMUCOSAL

## 2021-08-07 MED ORDER — OXYCODONE HCL 5 MG PO TABS
5.0000 mg | ORAL_TABLET | Freq: Once | ORAL | Status: DC | PRN
Start: 2021-08-07 — End: 2021-08-07

## 2021-08-07 MED ORDER — AMISULPRIDE (ANTIEMETIC) 5 MG/2ML IV SOLN: 10.0000 mg | Freq: Once | INTRAVENOUS | Status: DC | PRN

## 2021-08-07 MED ORDER — BUPIVACAINE LIPOSOME 1.3 % IJ SUSP: 20.0000 mL | Freq: Once | INTRAMUSCULAR | Status: AC

## 2021-08-07 MED ORDER — CEFAZOLIN SODIUM-DEXTROSE 2-4 GM/100ML-% IV SOLN
2.0000 g | Freq: Four times a day (QID) | INTRAVENOUS | Status: AC
  Administered 2021-08-07 (×2): 2 g via INTRAVENOUS
  Filled 2021-08-07 (×2): qty 100

## 2021-08-07 MED ORDER — BUPIVACAINE LIPOSOME 1.3 % IJ SUSP
INTRAMUSCULAR | Status: AC
  Filled 2021-08-07: qty 20

## 2021-08-07 MED ORDER — PROPOFOL 10 MG/ML IV BOLUS
INTRAVENOUS | Status: DC | PRN
  Administered 2021-08-07: 20 mg via INTRAVENOUS

## 2021-08-07 MED ORDER — MIDAZOLAM HCL 2 MG/2ML IJ SOLN
1.0000 mg | INTRAMUSCULAR | Status: DC
  Administered 2021-08-07 (×2): 1 mg via INTRAVENOUS
  Filled 2021-08-07: qty 2

## 2021-08-07 MED ORDER — HYDROCHLOROTHIAZIDE 25 MG PO TABS
12.5000 mg | ORAL_TABLET | Freq: Every morning | ORAL | Status: DC
  Administered 2021-08-08 – 2021-08-09 (×2): 12.5 mg via ORAL
  Filled 2021-08-07 (×2): qty 1

## 2021-08-07 MED ORDER — MONTELUKAST SODIUM 10 MG PO TABS: 10.0000 mg | ORAL_TABLET | Freq: Every day | ORAL | Status: DC

## 2021-08-07 MED ORDER — EPHEDRINE SULFATE 50 MG/ML IJ SOLN
INTRAMUSCULAR | Status: DC | PRN
  Administered 2021-08-07: 5 mg via INTRAVENOUS

## 2021-08-07 MED ORDER — STERILE WATER FOR IRRIGATION IR SOLN
Status: DC | PRN
  Administered 2021-08-07: 2000 mL

## 2021-08-07 MED ORDER — LACTATED RINGERS IV SOLN: INTRAVENOUS | Status: DC

## 2021-08-07 MED ORDER — HYDROMORPHONE HCL 2 MG PO TABS: 2.0000 mg | ORAL_TABLET | ORAL | Status: DC | PRN

## 2021-08-07 MED ORDER — SODIUM CHLORIDE 0.9 % IR SOLN
Status: DC | PRN
  Administered 2021-08-07: 1000 mL

## 2021-08-07 MED ORDER — GLYCOPYRROLATE 0.2 MG/ML IJ SOLN
INTRAMUSCULAR | Status: DC | PRN
  Administered 2021-08-07: .2 mg via INTRAVENOUS

## 2021-08-07 MED ORDER — DEXAMETHASONE SODIUM PHOSPHATE 10 MG/ML IJ SOLN
10.0000 mg | Freq: Once | INTRAMUSCULAR | Status: AC
  Administered 2021-08-08: 10 mg via INTRAVENOUS
  Filled 2021-08-07: qty 1

## 2021-08-07 MED ORDER — FLEET ENEMA 7-19 GM/118ML RE ENEM: 1.0000 | ENEMA | Freq: Once | RECTAL | Status: DC | PRN

## 2021-08-07 MED ORDER — DEXAMETHASONE SODIUM PHOSPHATE 10 MG/ML IJ SOLN
INTRAMUSCULAR | Status: DC | PRN
  Administered 2021-08-07: 10 mg

## 2021-08-07 MED ORDER — ONDANSETRON HCL 4 MG/2ML IJ SOLN
4.0000 mg | Freq: Four times a day (QID) | INTRAMUSCULAR | Status: DC | PRN
  Administered 2021-08-07: 4 mg via INTRAVENOUS
  Filled 2021-08-07: qty 2

## 2021-08-07 MED ORDER — METHOCARBAMOL 500 MG IVPB - SIMPLE MED
500.0000 mg | Freq: Four times a day (QID) | INTRAVENOUS | Status: DC | PRN
  Filled 2021-08-07: qty 50

## 2021-08-07 MED ORDER — OXYCODONE HCL 5 MG/5ML PO SOLN: 5.0000 mg | Freq: Once | ORAL | Status: DC | PRN

## 2021-08-07 MED ORDER — PHENOL 1.4 % MT LIQD: 1.0000 | OROMUCOSAL | Status: DC | PRN

## 2021-08-07 MED ORDER — ACETAMINOPHEN 500 MG PO TABS
1000.0000 mg | ORAL_TABLET | Freq: Four times a day (QID) | ORAL | Status: AC
  Administered 2021-08-07 – 2021-08-08 (×4): 1000 mg via ORAL
  Filled 2021-08-07 (×4): qty 2

## 2021-08-07 MED ORDER — CEFAZOLIN SODIUM-DEXTROSE 2-4 GM/100ML-% IV SOLN
2.0000 g | INTRAVENOUS | Status: AC
  Administered 2021-08-07: 2 g via INTRAVENOUS
  Filled 2021-08-07: qty 100

## 2021-08-07 MED ORDER — ONDANSETRON HCL 4 MG/2ML IJ SOLN
INTRAMUSCULAR | Status: DC | PRN
  Administered 2021-08-07: 4 mg via INTRAVENOUS

## 2021-08-07 SURGICAL SUPPLY — 57 items
ATTUNE PS FEM RT SZ 5 CEM KNEE (Femur) ×1 IMPLANT
BAG COUNTER SPONGE SURGICOUNT (BAG) ×1 IMPLANT
BAG SPEC THK2 15X12 ZIP CLS (MISCELLANEOUS) ×1
BAG SPNG CNTER NS LX DISP (BAG) ×1
BAG ZIPLOCK 12X15 (MISCELLANEOUS) ×2 IMPLANT
BASEPLATE TIBIAL ROTATING SZ 4 (Knees) ×1 IMPLANT
BLADE SAG 18X100X1.27 (BLADE) ×2 IMPLANT
BLADE SAW SGTL 11.0X1.19X90.0M (BLADE) ×2 IMPLANT
BNDG ELASTIC 6X5.8 VLCR STR LF (GAUZE/BANDAGES/DRESSINGS) ×2 IMPLANT
BOWL SMART MIX CTS (DISPOSABLE) ×2 IMPLANT
BSPLAT TIB 4 CMNT ROT PLAT STR (Knees) ×1 IMPLANT
CEMENT HV SMART SET (Cement) ×4 IMPLANT
COVER SURGICAL LIGHT HANDLE (MISCELLANEOUS) ×2 IMPLANT
CUFF TOURN SGL QUICK 34 (TOURNIQUET CUFF) ×2
CUFF TRNQT CYL 34X4.125X (TOURNIQUET CUFF) ×1 IMPLANT
DECANTER SPIKE VIAL GLASS SM (MISCELLANEOUS) ×2 IMPLANT
DRAPE INCISE IOBAN 66X45 STRL (DRAPES) ×5 IMPLANT
DRAPE U-SHAPE 47X51 STRL (DRAPES) ×2 IMPLANT
DRSG AQUACEL AG ADV 3.5X10 (GAUZE/BANDAGES/DRESSINGS) ×2 IMPLANT
DURAPREP 26ML APPLICATOR (WOUND CARE) ×2 IMPLANT
ELECT REM PT RETURN 15FT ADLT (MISCELLANEOUS) ×2 IMPLANT
GLOVE SRG 8 PF TXTR STRL LF DI (GLOVE) ×1 IMPLANT
GLOVE SURG ENC MOIS LTX SZ6.5 (GLOVE) ×2 IMPLANT
GLOVE SURG ENC MOIS LTX SZ8 (GLOVE) ×4 IMPLANT
GLOVE SURG UNDER POLY LF SZ7 (GLOVE) ×2 IMPLANT
GLOVE SURG UNDER POLY LF SZ8 (GLOVE) ×2
GLOVE SURG UNDER POLY LF SZ8.5 (GLOVE) ×2 IMPLANT
GOWN STRL REUS W/TWL LRG LVL3 (GOWN DISPOSABLE) ×4 IMPLANT
GOWN STRL REUS W/TWL XL LVL3 (GOWN DISPOSABLE) ×2 IMPLANT
HANDPIECE INTERPULSE COAX TIP (DISPOSABLE) ×2
HOLDER FOLEY CATH W/STRAP (MISCELLANEOUS) ×1 IMPLANT
IMMOBILIZER KNEE 20 (SOFTGOODS) ×2
IMMOBILIZER KNEE 20 THIGH 36 (SOFTGOODS) ×1 IMPLANT
INSERT TIBIAL KNEE SZ 5 12MM (Insert) IMPLANT
KIT TURNOVER KIT A (KITS) ×2 IMPLANT
MANIFOLD NEPTUNE II (INSTRUMENTS) ×2 IMPLANT
NS IRRIG 1000ML POUR BTL (IV SOLUTION) ×1 IMPLANT
PACK TOTAL KNEE CUSTOM (KITS) ×2 IMPLANT
PADDING CAST COTTON 6X4 STRL (CAST SUPPLIES) ×3 IMPLANT
PATELLA MEDIAL ATTUN 35MM KNEE (Knees) ×1 IMPLANT
PIN DRILL FIX HALF THREAD (BIT) ×1 IMPLANT
PIN STEINMAN FIXATION KNEE (PIN) ×1 IMPLANT
PROTECTOR NERVE ULNAR (MISCELLANEOUS) ×2 IMPLANT
SET HNDPC FAN SPRY TIP SCT (DISPOSABLE) ×1 IMPLANT
SPONGE T-LAP 18X18 ~~LOC~~+RFID (SPONGE) ×2 IMPLANT
STRIP CLOSURE SKIN 1/2X4 (GAUZE/BANDAGES/DRESSINGS) ×4 IMPLANT
SUT MNCRL AB 4-0 PS2 18 (SUTURE) ×2 IMPLANT
SUT STRATAFIX 0 PDS 27 VIOLET (SUTURE) ×2
SUT VIC AB 2-0 CT1 27 (SUTURE) ×6
SUT VIC AB 2-0 CT1 TAPERPNT 27 (SUTURE) ×3 IMPLANT
SUTURE STRATFX 0 PDS 27 VIOLET (SUTURE) ×1 IMPLANT
TIBIAL INSERT KNEE SZ 5 12MM (Insert) ×2 IMPLANT
TRAY FOLEY MTR SLVR 14FR STAT (SET/KITS/TRAYS/PACK) ×1 IMPLANT
TRAY FOLEY MTR SLVR 16FR STAT (SET/KITS/TRAYS/PACK) ×1 IMPLANT
TUBE SUCTION HIGH CAP CLEAR NV (SUCTIONS) ×2 IMPLANT
WATER STERILE IRR 1000ML POUR (IV SOLUTION) ×2 IMPLANT
WRAP KNEE MAXI GEL POST OP (GAUZE/BANDAGES/DRESSINGS) ×2 IMPLANT

## 2021-08-07 NOTE — Op Note (Signed)
OPERATIVE REPORT-TOTAL KNEE ARTHROPLASTY   Pre-operative diagnosis- Osteoarthritis  Right knee(s)  Post-operative diagnosis- Osteoarthritis Right knee(s)  Procedure-  Right  Total Knee Arthroplasty  Surgeon- Dione Plover. Deniese Oberry, MD  Assistant- Theresa Duty, PA-C   Anesthesia-   Adductor canal block and spinal  EBL-25 mL   Drains None  Tourniquet time-  Total Tourniquet Time Documented: Thigh (Right) - 46 minutes Total: Thigh (Right) - 46 minutes     Complications- None  Condition-PACU - hemodynamically stable.   Brief Clinical Note  Joanna Lee is a 70 y.o. year old female with end stage OA of her right knee with progressively worsening pain and dysfunction. She has constant pain, with activity and at rest and significant functional deficits with difficulties even with ADLs. She has had extensive non-op management including analgesics, injections of cortisone and viscosupplements, and home exercise program, but remains in significant pain with significant dysfunction.Radiographs show bone on bone arthritis medial and patellofemoral. She presents now for right Total Knee Arthroplasty.     Procedure in detail---   The patient is brought into the operating room and positioned supine on the operating table. After successful administration of  Adductor canal block and spinal,   a tourniquet is placed high on the  Right thigh(s) and the lower extremity is prepped and draped in the usual sterile fashion. Time out is performed by the operating team and then the  Right lower extremity is wrapped in Esmarch, knee flexed and the tourniquet inflated to 300 mmHg.       A midline incision is made with a ten blade through the subcutaneous tissue to the level of the extensor mechanism. A fresh blade is used to make a medial parapatellar arthrotomy. Soft tissue over the proximal medial tibia is subperiosteally elevated to the joint line with a knife and into the semimembranosus bursa with a  Cobb elevator. Soft tissue over the proximal lateral tibia is elevated with attention being paid to avoiding the patellar tendon on the tibial tubercle. The patella is everted, knee flexed 90 degrees and the ACL and PCL are removed. Findings are bone on bone medial and patellofemoral with large global osteophytes        The drill is used to create a starting hole in the distal femur and the canal is thoroughly irrigated with sterile saline to remove the fatty contents. The 5 degree Right  valgus alignment guide is placed into the femoral canal and the distal femoral cutting block is pinned to remove 9 mm off the distal femur. Resection is made with an oscillating saw.      The tibia is subluxed forward and the menisci are removed. The extramedullary alignment guide is placed referencing proximally at the medial aspect of the tibial tubercle and distally along the second metatarsal axis and tibial crest. The block is pinned to remove 49m off the more deficient medial  side. Resection is made with an oscillating saw. Size 4is the most appropriate size for the tibia and the proximal tibia is prepared with the modular drill and keel punch for that size.      The femoral sizing guide is placed and size 5 is most appropriate. Rotation is marked off the epicondylar axis and confirmed by creating a rectangular flexion gap at 90 degrees. The size 5 cutting block is pinned in this rotation and the anterior, posterior and chamfer cuts are made with the oscillating saw. The intercondylar block is then placed and that cut is made.  Trial size 4 tibial component, trial size 5 posterior stabilized femur and a 12  mm posterior stabilized rotating platform insert trial is placed. Full extension is achieved with excellent varus/valgus and anterior/posterior balance throughout full range of motion. The patella is everted and thickness measured to be 22  mm. Free hand resection is taken to 12 mm, a 35 template is placed, lug  holes are drilled, trial patella is placed, and it tracks normally. Osteophytes are removed off the posterior femur with the trial in place. All trials are removed and the cut bone surfaces prepared with pulsatile lavage. Cement is mixed and once ready for implantation, the size 4 tibial implant, size  5 posterior stabilized femoral component, and the size 35 patella are cemented in place and the patella is held with the clamp. The trial insert is placed and the knee held in full extension. The Exparel (20 ml mixed with 60 ml saline) is injected into the extensor mechanism, posterior capsule, medial and lateral gutters and subcutaneous tissues.  All extruded cement is removed and once the cement is hard the permanent 12 mm posterior stabilized rotating platform insert is placed into the tibial tray.      The wound is copiously irrigated with saline solution and the extensor mechanism closed with # 0 Stratofix suture. The tourniquet is released for a total tourniquet time of 46  minutes. Flexion against gravity is 140 degrees and the patella tracks normally. Subcutaneous tissue is closed with 2.0 vicryl and subcuticular with running 4.0 Monocryl. The incision is cleaned and dried and steri-strips and a bulky sterile dressing are applied. The limb is placed into a knee immobilizer and the patient is awakened and transported to recovery in stable condition.      Please note that a surgical assistant was a medical necessity for this procedure in order to perform it in a safe and expeditious manner. Surgical assistant was necessary to retract the ligaments and vital neurovascular structures to prevent injury to them and also necessary for proper positioning of the limb to allow for anatomic placement of the prosthesis.   Dione Plover Marvetta Vohs, MD    08/07/2021, 9:41 AM

## 2021-08-07 NOTE — Anesthesia Procedure Notes (Signed)
Anesthesia Regional Block: Adductor canal block   Pre-Anesthetic Checklist: , timeout performed,  Correct Patient, Correct Site, Correct Laterality,  Correct Procedure, Correct Position, site marked,  Risks and benefits discussed,  Surgical consent,  Pre-op evaluation,  At surgeon's request and post-op pain management  Laterality: Right  Prep: Maximum Sterile Barrier Precautions used, chloraprep       Needles:  Injection technique: Single-shot  Needle Type: Echogenic Stimulator Needle     Needle Length: 9cm  Needle Gauge: 22     Additional Needles:   Procedures:,,,, ultrasound used (permanent image in chart),,    Narrative:  Start time: 08/07/2021 8:05 AM End time: 08/07/2021 8:10 AM Injection made incrementally with aspirations every 5 mL.  Performed by: Personally  Anesthesiologist: Pervis Hocking, DO  Additional Notes: Monitors applied. No increased pain on injection. No increased resistance to injection. Injection made in 5cc increments. Good needle visualization. Patient tolerated procedure well.

## 2021-08-07 NOTE — Progress Notes (Signed)
Assisted Dr. Criss Rosales with Right Knee Adductor Canal block. Side rails up, monitors on throughout procedure. See vital signs in flow sheet. Tolerated Procedure well.

## 2021-08-07 NOTE — Discharge Instructions (Signed)
 Joanna Aluisio, MD Total Joint Specialist [facility] [address] [place], [place] [address] [phone]  TOTAL KNEE REPLACEMENT POSTOPERATIVE DIRECTIONS    Knee Rehabilitation, Guidelines Following Surgery  Results after knee surgery are often greatly improved when you follow the exercise, range of motion and muscle strengthening exercises prescribed by your doctor. Safety measures are also important to protect the knee from further injury. If any of these exercises cause you to have increased pain or swelling in your knee joint, decrease the amount until you are comfortable again and slowly increase them. If you have problems or questions, call your caregiver or physical therapist for advice.    BLOOD CLOT PREVENTION Take a 10 mg Xarelto once a day for three weeks following surgery. Then take an 81 mg Aspirin once a day for three weeks. Then discontinue Aspirin. You may resume your vitamins/supplements once you have discontinued the Xarelto. Do not take any NSAIDs (Advil, Aleve, Ibuprofen, Meloxicam, etc.) until you have discontinued the Xarelto.    HOME CARE INSTRUCTIONS  Remove items at home which could result in a fall. This includes throw rugs or furniture in walking pathways.  ICE to the affected knee as much as tolerated. Icing helps control swelling. If the swelling is well controlled you will be more comfortable and rehab easier. Continue to use ice on the knee for pain and swelling from surgery. You may notice swelling that will progress down to the foot and ankle. This is normal after surgery. Elevate the leg when you are not up walking on it.    Continue to use the breathing machine which will help keep your temperature down. It is common for your temperature to cycle up and down following surgery, especially at night when you are not up moving around and exerting yourself. The breathing machine keeps your lungs expanded and your temperature  down. Do not place pillow under the operative knee, focus on keeping the knee straight while resting  DIET You may resume your previous home diet once you are discharged from the hospital.  DRESSING / WOUND CARE / SHOWERING Keep your bulky bandage on for 2 days. On the third post-operative day you may remove the Ace bandage and gauze. There is a waterproof adhesive bandage on your skin which will stay in place until your first follow-up appointment. Once you remove this you will not need to place another bandage You may begin showering 3 days following surgery, but do not submerge the incision under water.  ACTIVITY For the first 5 days, the key is rest and control of pain and swelling Do your home exercises twice a day starting on post-operative day 3. On the days you go to physical therapy, just do the home exercises once that day. You should rest, ice and elevate the leg for 50 minutes out of every hour. Get up and walk/stretch for 10 minutes per hour. After 5 days you can increase your activity slowly as tolerated. Walk with your walker as instructed. Use the walker until you are comfortable transitioning to a cane. Walk with the cane in the opposite hand of the operative leg. You may discontinue the cane once you are comfortable and walking steadily. Avoid periods of inactivity such as sitting longer than an hour when not asleep. This helps prevent blood clots.  You may discontinue the knee immobilizer once you are able to perform a straight leg raise while lying down. You may resume a sexual relationship in one   month or when given the OK by your doctor.  You may return to work once you are cleared by your doctor.  Do not drive a car for 6 weeks or until released by your surgeon.  Do not drive while taking narcotics.  TED HOSE STOCKINGS Wear the elastic stockings on both legs for three weeks following surgery during the day. You may remove them at night for sleeping.  WEIGHT  BEARING Weight bearing as tolerated with assist device (walker, cane, etc) as directed, use it as long as suggested by your surgeon or therapist, typically at least 4-6 weeks.  POSTOPERATIVE CONSTIPATION PROTOCOL Constipation - defined medically as fewer than three stools per week and severe constipation as less than one stool per week.  One of the most common issues patients have following surgery is constipation.  Even if you have a regular bowel pattern at home, your normal regimen is likely to be disrupted due to multiple reasons following surgery.  Combination of anesthesia, postoperative narcotics, change in appetite and fluid intake all can affect your bowels.  In order to avoid complications following surgery, here are some recommendations in order to help you during your recovery period.  Colace (docusate) - Pick up an over-the-counter form of Colace or another stool softener and take twice a day as long as you are requiring postoperative pain medications.  Take with a full glass of water daily.  If you experience loose stools or diarrhea, hold the colace until you stool forms back up. If your symptoms do not get better within 1 week or if they get worse, check with your doctor. Dulcolax (bisacodyl) - Pick up over-the-counter and take as directed by the product packaging as needed to assist with the movement of your bowels.  Take with a full glass of water.  Use this product as needed if not relieved by Colace only.  MiraLax (polyethylene glycol) - Pick up over-the-counter to have on hand. MiraLax is a solution that will increase the amount of water in your bowels to assist with bowel movements.  Take as directed and can mix with a glass of water, juice, soda, coffee, or tea. Take if you go more than two days without a movement. Do not use MiraLax more than once per day. Call your doctor if you are still constipated or irregular after using this medication for 7 days in a row.  If you continue  to have problems with postoperative constipation, please contact the office for further assistance and recommendations.  If you experience "the worst abdominal pain ever" or develop nausea or vomiting, please contact the office immediatly for further recommendations for treatment.  ITCHING If you experience itching with your medications, try taking only a single pain pill, or even half a pain pill at a time.  You can also use Benadryl over the counter for itching or also to help with sleep.   MEDICATIONS See your medication summary on the "After Visit Summary" that the nursing staff will review with you prior to discharge.  You may have some home medications which will be placed on hold until you complete the course of blood thinner medication.  It is important for you to complete the blood thinner medication as prescribed by your surgeon.  Continue your approved medications as instructed at time of discharge.  PRECAUTIONS If you experience chest pain or shortness of breath - call 911 immediately for transfer to the hospital emergency department.  If you develop a fever greater that 101   F, purulent drainage from wound, increased redness or drainage from wound, foul odor from the wound/dressing, or calf pain - CONTACT YOUR SURGEON.                                                   FOLLOW-UP APPOINTMENTS Make sure you keep all of your appointments after your operation with your surgeon and caregivers. You should call the office at the above phone number and make an appointment for approximately two weeks after the date of your surgery or on the date instructed by your surgeon outlined in the "After Visit Summary".  RANGE OF MOTION AND STRENGTHENING EXERCISES  Rehabilitation of the knee is important following a knee injury or an operation. After just a few days of immobilization, the muscles of the thigh which control the knee become weakened and shrink (atrophy). Knee exercises are designed to build up  the tone and strength of the thigh muscles and to improve knee motion. Often times heat used for twenty to thirty minutes before working out will loosen up your tissues and help with improving the range of motion but do not use heat for the first two weeks following surgery. These exercises can be done on a training (exercise) mat, on the floor, on a table or on a bed. Use what ever works the best and is most comfortable for you Knee exercises include:  Leg Lifts - While your knee is still immobilized in a splint or cast, you can do straight leg raises. Lift the leg to 60 degrees, hold for 3 sec, and slowly lower the leg. Repeat 10-20 times 2-3 times daily. Perform this exercise against resistance later as your knee gets better.  Quad and Hamstring Sets - Tighten up the muscle on the front of the thigh (Quad) and hold for 5-10 sec. Repeat this 10-20 times hourly. Hamstring sets are done by pushing the foot backward against an object and holding for 5-10 sec. Repeat as with quad sets.  Leg Slides: Lying on your back, slowly slide your foot toward your buttocks, bending your knee up off the floor (only go as far as is comfortable). Then slowly slide your foot back down until your leg is flat on the floor again. Angel Wings: Lying on your back spread your legs to the side as far apart as you can without causing discomfort.  A rehabilitation program following serious knee injuries can speed recovery and prevent re-injury in the future due to weakened muscles. Contact your doctor or a physical therapist for more information on knee rehabilitation.   POST-OPERATIVE OPIOID TAPER INSTRUCTIONS: It is important to wean off of your opioid medication as soon as possible. If you do not need pain medication after your surgery it is ok to stop day one. Opioids include: Codeine, Hydrocodone(Norco, Vicodin), Oxycodone(Percocet, oxycontin) and hydromorphone amongst others.  Long term and even short term use of opiods can  cause: Increased pain response Dependence Constipation Depression Respiratory depression And more.  Withdrawal symptoms can include Flu like symptoms Nausea, vomiting And more Techniques to manage these symptoms Hydrate well Eat regular healthy meals Stay active Use relaxation techniques(deep breathing, meditating, yoga) Do Not substitute Alcohol to help with tapering If you have been on opioids for less than two weeks and do not have pain than it is ok to stop all together.    plan should start within one week post op of your joint replacement. Maintain the same interval or time between taking each dose and first decrease the dose.  Cut the total daily intake of opioids by one tablet each day Next start to increase the time between doses. The last dose that should be eliminated is the evening dose.   IF YOU ARE TRANSFERRED TO A SKILLED REHAB FACILITY If the patient is transferred to a skilled rehab facility following release from the hospital, a list of the current medications will be sent to the facility for the patient to continue.  When discharged from the skilled rehab facility, please have the facility set up the patient's Home Health Physical Therapy prior to being released. Also, the skilled facility will be responsible for providing the patient with their medications at time of release from the facility to include their pain medication, the muscle relaxants, and their blood thinner medication. If the patient is still at the rehab facility at time of the two week follow up appointment, the skilled rehab facility will also need to assist the patient in arranging follow up appointment in our office and any transportation needs.  MAKE SURE YOU:  Understand these instructions.  Get help right away if you are not doing well or get worse.   DENTAL ANTIBIOTICS:  In most cases prophylactic antibiotics for Dental procdeures after total joint surgery are not  necessary.  Exceptions are as follows:  1. History of prior total joint infection  2. Severely immunocompromised (Organ Transplant, cancer chemotherapy, Rheumatoid biologic meds such as Humera)  3. Poorly controlled diabetes (A1C &gt; 8.0, blood glucose over 200)  If you have one of these conditions, contact your surgeon for an antibiotic prescription, prior to your dental procedure.    Pick up stool softner and laxative for home use following surgery while on pain medications. Do not submerge incision under water. Please use good hand washing techniques while changing dressing each day. May shower starting three days after surgery. Please use a clean towel to pat the incision dry following showers. Continue to use ice for pain and swelling after surgery. Do not use any lotions or creams on the incision until instructed by your surgeon.  

## 2021-08-07 NOTE — Anesthesia Postprocedure Evaluation (Signed)
Anesthesia Post Note  Patient: Joanna Lee  Procedure(s) Performed: TOTAL KNEE ARTHROPLASTY (Right: Knee)     Patient location during evaluation: PACU Anesthesia Type: Regional, MAC and Spinal Level of consciousness: oriented and awake and alert Pain management: pain level controlled Vital Signs Assessment: post-procedure vital signs reviewed and stable Respiratory status: spontaneous breathing and respiratory function stable Cardiovascular status: blood pressure returned to baseline and stable Postop Assessment: no headache, no backache, no apparent nausea or vomiting and spinal receding Anesthetic complications: no   No notable events documented.  Last Vitals:  Vitals:   08/07/21 0807 08/07/21 0812  BP: (!) 108/58   Pulse: (!) 58 65  Resp: 11 (!) 9  Temp:    SpO2: 99% 98%    Last Pain:  Vitals:   08/07/21 0707  TempSrc: Oral                 Pervis Hocking

## 2021-08-07 NOTE — Transfer of Care (Signed)
Immediate Anesthesia Transfer of Care Note  Patient: Joanna Lee  Procedure(s) Performed: TOTAL KNEE ARTHROPLASTY (Right: Knee)  Patient Location: PACU  Anesthesia Type:Spinal  Level of Consciousness: awake, alert  and oriented  Airway & Oxygen Therapy: Patient Spontanous Breathing  Post-op Assessment: Report given to RN and Post -op Vital signs reviewed and stable  Post vital signs: Reviewed and stable  Last Vitals:  Vitals Value Taken Time  BP 109/56 08/07/21 1005  Temp    Pulse 69 08/07/21 1014  Resp 15 08/07/21 1014  SpO2 100 % 08/07/21 1014  Vitals shown include unvalidated device data.  Last Pain:  Vitals:   08/07/21 0707  TempSrc: Oral         Complications: No notable events documented.

## 2021-08-07 NOTE — Care Plan (Signed)
Ortho Bundle Case Management Note  Patient Details  Name: Joanna Lee MRN: [id number] Date of Birth: 11-13-1951                  R TKA on 08/07/21. DCP: Home with sister. 2 story home with 3 steps. DME: RW & 3in1 ordered through [facility]. PT: EO 9/22   DME Arranged:  [facility] Rile rolling, 3-N-1 DME Agency:  [employer]  HH Arranged:    [place] Agency:     Additional Comments: Please contact me with any questions of if this plan should need to change.  Marianne Sofia, RN,CCM [facility]  [phone] 08/07/2021, 11:15 AM

## 2021-08-07 NOTE — Evaluation (Signed)
Physical Therapy Evaluation Patient Details Name: Joanna Lee MRN: [id number] DOB: February 18, 1951 Today's Date: 08/07/2021  History of Present Illness  s/p R TKA. PMH: L TKA  Clinical Impression  Pt is s/p TKA resulting in the deficits listed below (see PT Problem List).  Pt sat EOB, able to stand and however gait limited  d/t N/V. Pt returned to bed RN made aware.   Pt will benefit from skilled PT to increase their independence and safety with mobility to allow discharge to the venue listed below.         Recommendations for follow up therapy are one component of a multi-disciplinary discharge planning process, led by the attending physician.  Recommendations may be updated based on patient status, additional functional criteria and insurance authorization.  Follow Up Recommendations Follow surgeon's recommendation for DC plan and follow-up therapies    Equipment Recommendations  Rolling walker with 5" wheels    Recommendations for Other Services       Precautions / Restrictions Precautions Precautions: Fall;Knee Required Braces or Orthoses: Knee Immobilizer - Right Knee Immobilizer - Right: Discontinue once straight leg raise with < 10 degree lag Restrictions Weight Bearing Restrictions: No Other Position/Activity Restrictions: WBAT      Mobility  Bed Mobility Overal bed mobility: Needs Assistance Bed Mobility: Supine to Sit;Sit to Supine     Supine to sit: Min assist Sit to supine: Min assist   General bed mobility comments: cues for technique, min assist with RLE    Transfers Overall transfer level: Needs assistance Equipment used: Rolling walker (2 wheeled) Transfers: Sit to/from Stand Sit to Stand: Min assist         General transfer comment: cues for hand placement  Ambulation/Gait             General Gait Details: lateral steps along EOB, deferred amb d/t  N/V  Stairs            Wheelchair Mobility    Modified Rankin (Stroke Patients  Only)       Balance                                             Pertinent Vitals/Pain Pain Assessment: Faces Faces Pain Scale: Hurts little more Pain Location: right knee Pain Intervention(s): Limited activity within patient's tolerance;Monitored during session;Repositioned;Premedicated before session    Home Living Family/patient expects to be discharged to:: Private residence Living Arrangements: Alone Available Help at Discharge: Available PRN/intermittently;Friend(s) Type of Home: House Home Access: Stairs to enter   [employer] of Steps: 3 or 5 Home Layout: One level;Multi-level Home Equipment: None      Prior Function Level of Independence: Independent               Hand Dominance        Extremity/Trunk Assessment   Upper Extremity Assessment Upper Extremity Assessment: Defer to OT evaluation    Lower Extremity Assessment Lower Extremity Assessment: RLE deficits/detail RLE Deficits / Details: ankle WLF, knee extension and hip flexion 2+/5       Communication   Communication: No difficulties  Cognition Arousal/Alertness: Awake/alert Behavior During Therapy: WFL for tasks assessed/performed Overall Cognitive Status: Within Functional Limits for tasks assessed  General Comments      Exercises     Assessment/Plan    PT Assessment Patient needs continued PT services  PT Problem List Decreased strength;Decreased mobility;Decreased activity tolerance;Pain;Decreased knowledge of use of DME       PT Treatment Interventions DME instruction;Therapeutic activities;Gait training;Functional mobility training;Therapeutic exercise;Patient/family education;Stair training    PT Goals (Current goals can be found in the Care Plan section)  Acute Rehab PT Goals Patient Stated Goal: home PT Goal Formulation: With patient Time For Goal Achievement: 08/14/21 Potential to  Achieve Goals: Good    Frequency 7X/week   Barriers to discharge        Co-evaluation               AM-PAC PT "6 Clicks" Mobility  Outcome Measure Help needed turning from your back to your side while in a flat bed without using bedrails?: A Little Help needed moving from lying on your back to sitting on the side of a flat bed without using bedrails?: A Little Help needed moving to and from a bed to a chair (including a wheelchair)?: A Little Help needed standing up from a chair using your arms (e.g., wheelchair or bedside chair)?: A Little Help needed to walk in hospital room?: A Little Help needed climbing 3-5 steps with a railing? : A Little 6 Click Score: 18    End of Session   Activity Tolerance: Other (comment) (N/V) Patient left: with call bell/phone within reach;in bed;with bed alarm set Nurse Communication: Mobility status PT Visit Diagnosis: Other abnormalities of gait and mobility (R26.89)    Time: [id number] PT Time Calculation (min) (ACUTE ONLY): 20 min   Charges:   PT Evaluation $PT Eval Low Complexity: [place], PT  Acute Rehab Dept ([place]) [phone] Pager [phone]  08/07/2021   [facility] 08/07/2021, 5:45 PM

## 2021-08-07 NOTE — Anesthesia Procedure Notes (Signed)
Spinal  Patient location during procedure: OR Start time: 08/07/2021 8:15 AM End time: 08/07/2021 8:20 AM Reason for block: surgical anesthesia Staffing Performed: anesthesiologist  Anesthesiologist: Pervis Hocking, DO Preanesthetic Checklist Completed: patient identified, IV checked, risks and benefits discussed, surgical consent, monitors and equipment checked, pre-op evaluation and timeout performed Spinal Block Patient position: sitting Prep: DuraPrep and site prepped and draped Patient monitoring: cardiac monitor, continuous pulse ox and blood pressure Approach: midline Location: L3-4 Injection technique: single-shot Needle Needle type: Pencan  Needle gauge: 24 G Needle length: 9 cm Assessment Sensory level: T6 Events: CSF return Additional Notes Functioning IV was confirmed and monitors were applied. Sterile prep and drape, including hand hygiene and sterile gloves were used. The patient was positioned and the spine was prepped. The skin was anesthetized with lidocaine.  Free flow of clear CSF was obtained prior to injecting local anesthetic into the CSF.  The spinal needle aspirated freely following injection.  The needle was carefully withdrawn.  The patient tolerated the procedure well.

## 2021-08-07 NOTE — H&P (Signed)
TOTAL KNEE ADMISSION H&P  Patient is being admitted for right total knee arthroplasty.  Subjective:  Chief Complaint: Right knee pain.  HPI: Joanna Lee, 70 y.o. female has a history of pain and functional disability in the right knee due to arthritis and has failed non-surgical conservative treatments for greater than 12 weeks to include NSAID's and/or analgesics, flexibility and strengthening excercises, and activity modification. Onset of symptoms was gradual, starting  several  years ago with gradually worsening course since that time. The patient noted no past surgery on the right knee.  Patient currently rates pain in the right knee at 7 out of 10 with activity. Patient has worsening of pain with activity and weight bearing, pain that interferes with activities of daily living, pain with passive range of motion, and crepitus. Patient has evidence of periarticular osteophytes and joint space narrowing by imaging studies. There is no active infection.  Patient Active Problem List   Diagnosis Date Noted   Awareness alteration, transient 04/20/2015   Migraine with aura and without status migrainosus, not intractable 04/20/2015   Carotid artery aneurysm ([place]) 04/20/2015   Carotid stenosis    Allergic rhinitis 03/02/2014   S/P total knee arthroplasty 02/20/2014   Postoperative anemia due to acute blood loss 02/09/2014   OA (osteoarthritis) of knee 02/08/2014   Chronic cough 02/05/2012   Nonruptured cerebral aneurysm, internal carotid artery, intracranial portion 12/18/2011   Rheumatoid arthritis(714.0) 12/10/2011   Dizziness 11/29/2011   HTN (hypertension) 11/29/2011   Dyspnea 11/29/2011    Past Medical History:  Diagnosis Date   Anemia    Arthritis    Carotid artery aneurysm ([place]) 11/20/2003   History of blood transfusion    HTN (hypertension)    Migraines    Osteopenia    PONV (postoperative nausea and vomiting)    also states "SLOW TO WAKE UP"   Pre-diabetes    Rheumatoid  arthritis(714.0)    Sleep apnea    hasnt  used CPAP in years   Trichomonas infection    Vertigo    Vitamin D deficiency     Past Surgical History:  Procedure Laterality Date   ABDOMINAL HYSTERECTOMY     COMPLETE HYST   Coiling of Carotid Aneurysm  2005   CAROTID   KNEE SURGERY  2000/2009   Bilateral/ arthroscopy   SHOULDER SURGERY  1996   Left   TOTAL KNEE ARTHROPLASTY Left 02/08/2014   Procedure: LEFT TOTAL KNEE ARTHROPLASTY;  Surgeon: [name], MD;  Location: [facility];  Service: Orthopedics;  Laterality: Left;    Prior to Admission medications   Medication Sig Start Date End Date Taking? Authorizing Provider  aspirin 81 MG tablet Take 81 mg by mouth daily.   Yes [provider]  ASTRAGALUS PO Take 1 capsule by mouth daily as needed (winter cold prevention).   Yes [provider]  Cholecalciferol (VITAMIN D) 50 MCG (2000 UT) tablet Take 2,000 Units by mouth daily.   Yes [provider]  ECHINACEA PO Take 1 capsule by mouth daily as needed (winter cold prevention).   Yes [provider]  folic acid (FOLVITE) 1 MG tablet Take 1 mg by mouth daily.   Yes [provider]  hydrochlorothiazide (HYDRODIURIL) 25 MG tablet Take 12.5 mg by mouth every morning. 07/05/21  Yes [provider]  losartan (COZAAR) 50 MG tablet Take 50 mg by mouth daily. 07/05/21  Yes [provider]  methotrexate (RHEUMATREX) 2.5 MG tablet Take 7.5 mg by mouth  every Sunday.   Yes [provider]  montelukast (SINGULAIR) 10 MG tablet Take 1 tablet (10 mg total) by mouth at bedtime. 06/02/17  Yes Tereasa Coop, PA-C  polyvinyl alcohol (LIQUIFILM TEARS) 1.4 % ophthalmic solution Place 1 drop into both eyes as needed for dry eyes.   Yes [provider]  potassium chloride SA (KLOR-CON) 20 MEQ tablet Take 20 mEq by mouth daily. 07/05/21  Yes [provider]  TURMERIC PO Take 1,000 mg by mouth daily.   Yes [provider]  acetaminophen (TYLENOL) 325 MG tablet Take 2 tablets (650 mg total) by mouth every 6 (six) hours as needed for mild pain (or Fever >/= 101). Patient not taking: Reported on 07/18/2021 02/10/14   Dara Lords, Alexzandrew L, PA-C  benzonatate (TESSALON) 100 MG capsule Take 1 capsule (100 mg total) by mouth every 8 (eight) hours. Patient not taking: No sig reported 06/02/17   Tereasa Coop, PA-C  valsartan (DIOVAN) 160 MG tablet Take 160 mg by mouth daily.  02/05/12  [provider]    Allergies  Allergen Reactions   Codeine Other (See Comments)    Cant remember/ PASSED OUT per pt    Social History   Socioeconomic History   Marital status: Single    Spouse name: Not on file   Number of children: 1   Years of education: Not on file   Highest education level: Not on file  Occupational History   Occupation: RETIRED MILITARY   Occupation: RETIRED Paediatric nurse: UNEMPLOYED  Tobacco Use   Smoking status: Never   Smokeless tobacco: Never  Vaping Use   Vaping Use: Never used  Substance and Sexual Activity   Alcohol use: Not Currently    Alcohol/week: 1.0 standard drink    Types: 1 Glasses of wine per week   Drug use: No   Sexual activity: Never    Birth control/protection: Surgical  Other Topics Concern   Not on file  Social History Narrative   Lives alone.   Social Determinants of Health   Financial Resource Strain: Not on file  Food Insecurity: Not on file  Transportation Needs: Not on file  Physical Activity: Not on file  Stress: Not on file  Social Connections: Not on file  Intimate Partner Violence: Not on file    Tobacco Use: Low Risk    Smoking Tobacco Use: Never   Smokeless Tobacco Use: Never   Social History   Substance and Sexual Activity  Alcohol Use Not Currently   Alcohol/week: 1.0 standard drink   Types: 1 Glasses of wine per week    Family History  Problem Relation Age of Onset   Heart attack Brother 48   Lung cancer Father     Lung cancer Brother    Colon cancer Brother        x 2   Colon cancer Sister    Colon cancer Sister    Hypertension Other    Diabetes Other    Gout Other    Multiple sclerosis Sister    Ovarian cancer Sister    Breast cancer Mother    Diabetes Mother    Hypertension Mother    Asthma Brother     ROS: Constitutional: no fever, no chills, no night sweats, no significant weight loss Cardiovascular: no chest pain, no palpitations Respiratory: no cough, no shortness of breath, No COPD Gastrointestinal: no vomiting, no nausea Musculoskeletal: no swelling in Joints, Joint Pain Neurologic: no numbness,  no tingling, no difficulty with balance   Objective:  Physical Exam: Well nourished and well developed.  General: Alert and oriented x3, cooperative and pleasant, no acute distress.  Head: normocephalic, atraumatic, neck supple.  Eyes: EOMI.  Respiratory: breath sounds clear in all fields, no wheezing, rales, or rhonchi. Cardiovascular: Regular rate and rhythm, no murmurs, gallops or rubs.  Abdomen: non-tender to palpation and soft, normoactive bowel sounds. Musculoskeletal:   Right Hip Exam:   The range of motion: normal without discomfort.   There is no tenderness over the greater trochanter bursa     Right Knee Exam:   No effusion present. No swelling present.   The range of motion is: 5 to 125 degrees.   No crepitus on range of motion of the knee.   Positive medial joint line tenderness.   No lateral joint line tenderness.   The knee is stable.     Left Knee Exam:   Well-healed scar.   No effusion present. No swelling present.   The Range of motion is: 0 to 125 degrees.   No crepitus on range of motion of the knee   No medial joint line tenderness.   No lateral joint line tenderness   The knee is stable.  Calves soft and nontender. Motor function intact in LE. Strength 5/5 LE bilaterally. Neuro: Distal pulses 2+. Sensation to light touch intact in  LE.  Radiographs - AP and lateral of the bilateral knees from today demonstrate a well-placed prosthesis on the left with no periprosthetic abnormalities. On the right, she has bone-on-bone medial and patellofemoral compartment osteophyte formation.  Vital signs in last 24 hours:    Imaging Review Radiographs - AP and lateral of the bilateral knees from today demonstrate a well-placed prosthesis on the left with no periprosthetic abnormalities. On the right, she has bone-on-bone medial and patellofemoral compartment osteophyte formation.  Assessment/Plan:  End stage arthritis, right knee   The patient history, physical examination, clinical judgment of the provider and imaging studies are consistent with end stage degenerative joint disease of the right knee and total knee arthroplasty is deemed medically necessary. The treatment options including medical management, injection therapy arthroscopy and arthroplasty were discussed at length. The risks and benefits of total knee arthroplasty were presented and reviewed. The risks due to aseptic loosening, infection, stiffness, patella tracking problems, thromboembolic complications and other imponderables were discussed. The patient acknowledged the explanation, agreed to proceed with the plan and consent was signed. Patient is being admitted for inpatient treatment for surgery, pain control, PT, OT, prophylactic antibiotics, VTE prophylaxis, progressive ambulation and ADLs and discharge planning. The patient is planning to be discharged  home .   Patient's anticipated LOS is less than 2 midnights, meeting these requirements: - Younger than 9 - Lives within 1 hour of care - Has a competent adult at home to recover with post-op recover - NO history of  - Chronic pain requiring opiods  - Diabetes  - Coronary Artery Disease  - Heart failure  - Heart attack  - Stroke  - DVT/VTE  - Cardiac arrhythmia  - Respiratory Failure/COPD  - Renal  failure  - Anemia  - Advanced Liver disease    Therapy Plans: EO Disposition: Home with Sister (2-night stay because no help until Wednesday) Planned DVT Prophylaxis:  DME Needed: Gilford Rile, 3-in-1 PCP: Burnard Bunting, MD (patient contacting for clearance) [facility] PCP: [place] TXA:  Allergies: Peanuts, Tomatoes Anesthesia Concerns: N/V following anesthesia BMI: 37.2 Last HgbA1c: 6.2-6.4  [facility]   - Patient was instructed on what medications to stop prior to surgery. - Follow-up visit in 2 weeks with Dr. Wynelle Link - Begin physical therapy following surgery - Pre-operative lab work as pre-surgical testing - Prescriptions will be provided in hospital at time of discharge  Fenton Foy, [facility], PA-C Orthopedic Surgery [facility] [place]

## 2021-08-07 NOTE — Interval H&P Note (Signed)
History and Physical Interval Note:  08/07/2021 7:14 AM  Joanna Lee  has presented today for surgery, with the diagnosis of right knee osteoarthritis.  The various methods of treatment have been discussed with the patient and family. After consideration of risks, benefits and other options for treatment, the patient has consented to  Procedure(s): TOTAL KNEE ARTHROPLASTY (Right) as a surgical intervention.  The patient's history has been reviewed, patient examined, no change in status, stable for surgery.  I have reviewed the patient's chart and labs.  Questions were answered to the patient's satisfaction.     Pilar Plate Meng [place]

## 2021-08-07 NOTE — Progress Notes (Signed)
Orthopedic Tech Progress Note Patient Details:  Joanna Lee 19-Mar-1951 GI:[id number]  CPM Right Knee CPM Right Knee: On Right Knee Flexion (Degrees): 40 Right Knee Extension (Degrees): 10  Post Interventions Patient Tolerated: Well Instructions Provided: Care of device  [name] 08/07/2021, 10:05 AM

## 2021-08-08 ENCOUNTER — Encounter (HOSPITAL_COMMUNITY): Payer: Self-pay | Admitting: Orthopedic Surgery

## 2021-08-08 LAB — BASIC METABOLIC PANEL
Anion gap: 6 (ref 5–15)
BUN: 11 mg/dL (ref 8–23)
CO2: 26 mmol/L (ref 22–32)
Calcium: 9.2 mg/dL (ref 8.9–10.3)
Chloride: 105 mmol/L (ref 98–111)
Creatinine, Ser: 0.71 mg/dL (ref 0.44–1.00)
GFR, Estimated: >60 mL/min (ref >60)
Glucose, Bld: 166 mg/dL — ABNORMAL HIGH (ref 70–99)
Potassium: 3.3 mmol/L — ABNORMAL LOW (ref 3.5–5.1)
Sodium: 137 mmol/L (ref 135–145)

## 2021-08-08 LAB — CBC
HCT: 32.1 % — ABNORMAL LOW (ref 36.0–46.0)
Hemoglobin: 10.9 g/dL — ABNORMAL LOW (ref 12.0–15.0)
MCH: 31.3 pg (ref 26.0–34.0)
MCHC: 34 g/dL (ref 30.0–36.0)
MCV: 92.2 fL (ref 80.0–100.0)
Platelets: 199 10*3/uL (ref 150–400)
RBC: 3.48 MIL/uL — ABNORMAL LOW (ref 3.87–5.11)
RDW: 13.7 % (ref 11.5–15.5)
WBC: 12.2 10*3/uL — ABNORMAL HIGH (ref 4.0–10.5)
nRBC: 0 % (ref 0.0–0.2)

## 2021-08-08 MED ORDER — ACETAMINOPHEN 500 MG PO TABS
1000.0000 mg | ORAL_TABLET | Freq: Four times a day (QID) | ORAL | Status: DC | PRN
  Administered 2021-08-08 – 2021-08-09 (×2): 1000 mg via ORAL
  Filled 2021-08-08 (×2): qty 2

## 2021-08-08 MED ORDER — POTASSIUM CHLORIDE CRYS ER 20 MEQ PO TBCR
40.0000 meq | EXTENDED_RELEASE_TABLET | Freq: Once | ORAL | Status: AC
  Administered 2021-08-08: 40 meq via ORAL
  Filled 2021-08-08: qty 2

## 2021-08-08 NOTE — Plan of Care (Signed)
  Problem: Coping: Goal: Level of anxiety will decrease Outcome: Progressing   Problem: Pain Managment: Goal: General experience of comfort will improve Outcome: Progressing   Problem: Safety: Goal: Ability to remain free from injury will improve Outcome: Progressing   

## 2021-08-08 NOTE — TOC Transition Note (Signed)
Transition of Care [facility]) - CM/SW Discharge Note  Patient Details  Name: Joanna Lee MRN: [id number] Date of Birth: 01-05-1951  Transition of Care [facility]) CM/SW Contact:  Sherie Don, LCSW Phone Number: 08/08/2021, 9:46 AM  Clinical Narrative: Patient is expected to discharge home after working with PT. CSW met with patient to confirm discharge plan and needs. Patient will discharge home with OPPT at [facility] on 08/10/21. Patient will need a rolling walker, but declined the 3N1 as Medicare will no cover the cost. [employer] to deliver walker to patient's room. TOC signing off.  Final next level of care: OP Rehab Barriers to Discharge: No Barriers Identified  Patient Goals and CMS Choice Patient states their goals for this hospitalization and ongoing recovery are:: Discharge home with [place] CMS Medicare.gov Compare Post Acute Care list provided to:: Patient Choice offered to / list presented to : Patient  Discharge Plan and Services         DME Arranged: Walker rolling DME Agency: [employer] Representative spoke with at DME Agency: Prearranged in orthopedist's office  Readmission Risk Interventions No flowsheet data found.

## 2021-08-08 NOTE — Progress Notes (Signed)
   Subjective: 1 Day Post-Op Procedure(s) (LRB): TOTAL KNEE ARTHROPLASTY (Right) Patient reports pain as moderate.   Patient seen in rounds by Dr. Wynelle Link. Patient is well, and has had no acute complaints or problems other than pain in the right knee. Denies chest pain or SOB. Foley catheter removed this AM. We will continue therapy today.   Objective: Vital signs in last 24 hours: Temp:  [95 F (35 C)-98.3 F (36.8 C)] 98.3 F (36.8 C) (09/20 0514) Pulse Rate:  [25-71] 58 (09/20 0514) Resp:  [9-17] 16 (09/20 0514) BP: (108-173)/(56-97) 134/59 (09/20 0514) SpO2:  [88 %-100 %] 100 % (09/20 0514) Weight:  [92 kg] 92 kg (09/19 1309)  Intake/Output from previous day:  Intake/Output Summary (Last 24 hours) at 08/08/2021 0732 Last data filed at 08/08/2021 0725 Gross per 24 hour  Intake 5023.92 ml  Output 2860 ml  Net 2163.92 ml     Intake/Output this shift: Total I/O In: 106.3 [I.V.:106.3] Out: -   Labs: Recent Labs    08/08/21 0330  HGB 10.9*   Recent Labs    08/08/21 0330  WBC 12.2*  RBC 3.48*  HCT 32.1*  PLT 199   Recent Labs    08/08/21 0330  NA 137  K 3.3*  CL 105  CO2 26  BUN 11  CREATININE 0.71  GLUCOSE 166*  CALCIUM 9.2   No results for input(s): LABPT, INR in the last 72 hours.  Exam: General - Patient is Alert and Oriented Extremity - Neurologically intact Neurovascular intact Sensation intact distally Dorsiflexion/Plantar flexion intact Dressing - dressing C/D/I Motor Function - intact, moving foot and toes well on exam.   Past Medical History:  Diagnosis Date   Anemia    Arthritis    Carotid artery aneurysm ([place]) 11/20/2003   History of blood transfusion    HTN (hypertension)    Migraines    Osteopenia    PONV (postoperative nausea and vomiting)    also states "SLOW TO WAKE UP"   Pre-diabetes    Rheumatoid arthritis(714.0)    Sleep apnea    hasnt  used CPAP in years   Trichomonas infection    Vertigo    Vitamin D deficiency      Assessment/Plan: 1 Day Post-Op Procedure(s) (LRB): TOTAL KNEE ARTHROPLASTY (Right) Active Problems:   Primary osteoarthritis of right knee  Estimated body mass index is 35.93 kg/m as calculated from the following:   Height as of this encounter: 5\' 3"  (1.6 m).   Weight as of this encounter: 92 kg. Advance diet Up with therapy  Anticipated LOS equal to or greater than 2 midnights due to - Age 71 and older with one or more of the following:  - Obesity  - Expected need for hospital services (PT, OT, Nursing) required for safe  discharge  - Anticipated need for postoperative skilled nursing care or inpatient rehab  - Active co-morbidities: Diabetes OR   - Unanticipated findings during/Post Surgery: None  - Patient is a high risk of re-admission due to: None   DVT Prophylaxis - Aspirin Weight bearing as tolerated. Continue therapy.  Plan is to go Home after hospital stay. Will not have help at home until tomorrow, will plan for discharge at that time.  Theresa Duty, PA-C Orthopedic Surgery [phone] 08/08/2021, 7:32 AM

## 2021-08-08 NOTE — Progress Notes (Signed)
Physical Therapy Treatment Patient Details Name: Joanna Lee MRN: [id number] DOB: 26-Jun-1951 Today's Date: 08/08/2021   History of Present Illness s/p R TKA. PMH: L TKA    PT Comments    Pt progressing although limited gait distance d/t pain, fatigue, mild dizziness.   Recommendations for follow up therapy are one component of a multi-disciplinary discharge planning process, led by the attending physician.  Recommendations may be updated based on patient status, additional functional criteria and insurance authorization.  Follow Up Recommendations  Follow surgeon's recommendation for DC plan and follow-up therapies     Equipment Recommendations  Rolling walker with 5" wheels    Recommendations for Other Services       Precautions / Restrictions Precautions Precautions: Fall;Knee Required Braces or Orthoses: Knee Immobilizer - Right Knee Immobilizer - Right: Discontinue once straight leg raise with < 10 degree lag Restrictions Weight Bearing Restrictions: No RLE Weight Bearing: Weight bearing as tolerated     Mobility  Bed Mobility Overal bed mobility: Needs Assistance Bed Mobility: Supine to Sit     Supine to sit: Min assist     General bed mobility comments: cues for technique, min assist with RLE    Transfers Overall transfer level: Needs assistance Equipment used: Rolling walker (2 wheeled) Transfers: Sit to/from Stand Sit to Stand: Min assist         General transfer comment: cues for hand placement and RLE position  Ambulation/Gait Ambulation/Gait assistance: Herbalist (Feet): 12 Feet Assistive device: Rolling walker (2 wheeled) Gait Pattern/deviations: Step-to pattern     General Gait Details: cues for sequence and RW position   Stairs             Wheelchair Mobility    Modified Rankin (Stroke Patients Only)       Balance                                            Cognition Arousal/Alertness:  Awake/alert Behavior During Therapy: WFL for tasks assessed/performed Overall Cognitive Status: Within Functional Limits for tasks assessed                                 General Comments: donned LB garment with mod assist      Exercises Total Joint Exercises Ankle Circles/Pumps: AROM;Both;10 reps Quad Sets: AROM;Both;5 reps    General Comments        Pertinent Vitals/Pain Pain Assessment: Faces Faces Pain Scale: Hurts even more Pain Location: right knee Pain Descriptors / Indicators: Discomfort;Grimacing;Sore Pain Intervention(s): Limited activity within patient's tolerance;Monitored during session;Premedicated before session;Repositioned    Home Living                      Prior Function            PT Goals (current goals can now be found in the care plan section) Acute Rehab PT Goals Patient Stated Goal: home PT Goal Formulation: With patient Time For Goal Achievement: 08/14/21 Potential to Achieve Goals: Good Progress towards PT goals: Progressing toward goals    Frequency    7X/week      PT Plan Current plan remains appropriate    Co-evaluation              AM-PAC PT "6 Clicks" Mobility  Outcome Measure  Help needed turning from your back to your side while in a flat bed without using bedrails?: A Little Help needed moving from lying on your back to sitting on the side of a flat bed without using bedrails?: A Little Help needed moving to and from a bed to a chair (including a wheelchair)?: A Little Help needed standing up from a chair using your arms (e.g., wheelchair or bedside chair)?: A Little Help needed to walk in hospital room?: A Little Help needed climbing 3-5 steps with a railing? : A Little 6 Click Score: 18    End of Session Equipment Utilized During Treatment: Gait belt;Right knee immobilizer Activity Tolerance: Patient limited by fatigue Patient left: in chair;with call bell/phone within reach;with chair  alarm set Nurse Communication: Mobility status PT Visit Diagnosis: Other abnormalities of gait and mobility (R26.89)     Time: 0762-2633 PT Time Calculation (min) (ACUTE ONLY): 24 min  Charges:  $Gait Training: 23-37 mins                     Baxter Flattery, PT  Acute Rehab Dept ([place]) [phone] Pager [phone]  08/08/2021    [facility] 08/08/2021, 9:04 AM

## 2021-08-08 NOTE — Plan of Care (Signed)
  Problem: Clinical Measurements: Goal: Ability to maintain clinical measurements within normal limits will improve Outcome: Progressing   Problem: Pain Managment: Goal: General experience of comfort will improve Outcome: Progressing   Problem: Activity: Goal: Ability to avoid complications of mobility impairment will improve Outcome: Progressing

## 2021-08-08 NOTE — Progress Notes (Signed)
Physical Therapy Treatment Patient Details Name: Joanna Lee MRN: [id number] DOB: 1951-05-24 Today's Date: 08/08/2021   History of Present Illness s/p R TKA. PMH: L TKA    PT Comments    Pt progressing well with PT. Family working on installing temporary ramp prior to possible d/c tomorrow.   Incr amb distance/tolerance this pm. Reviewed HEP and pt tol well. Pt does not feel quite ready to d/c, will need to work on amb greater distance in preparation for home.   Recommendations for follow up therapy are one component of a multi-disciplinary discharge planning process, led by the attending physician.  Recommendations may be updated based on patient status, additional functional criteria and insurance authorization.  Follow Up Recommendations  Follow surgeon's recommendation for DC plan and follow-up therapies     Equipment Recommendations  Rolling walker with 5" wheels    Recommendations for Other Services       Precautions / Restrictions Precautions Precautions: Fall;Knee Required Braces or Orthoses: Knee Immobilizer - Right Knee Immobilizer - Right: Discontinue once straight leg raise with < 10 degree lag Restrictions RLE Weight Bearing: Weight bearing as tolerated     Mobility  Bed Mobility               General bed mobility comments: in recliner    Transfers Overall transfer level: Needs assistance Equipment used: Rolling walker (2 wheeled) Transfers: Sit to/from Stand Sit to Stand: Min assist         General transfer comment: cues for hand placement and RLE position  Ambulation/Gait Ambulation/Gait assistance: Min assist Gait Distance (Feet): 16 Feet (x2) Assistive device: Rolling walker (2 wheeled) Gait Pattern/deviations: Step-to pattern     General Gait Details: cues for sequence and RW position   Stairs             Wheelchair Mobility    Modified Rankin (Stroke Patients Only)       Balance                                             Cognition Arousal/Alertness: Awake/alert Behavior During Therapy: WFL for tasks assessed/performed Overall Cognitive Status: Within Functional Limits for tasks assessed                                        Exercises Total Joint Exercises Ankle Circles/Pumps: AROM;Both;10 reps Quad Sets: AROM;Both;10 reps Heel Slides: AROM;AAROM;Left;10 reps    General Comments        Pertinent Vitals/Pain Pain Assessment: Faces Faces Pain Scale: Hurts even more (with activity) Pain Location: right knee Pain Descriptors / Indicators: Discomfort;Grimacing;Sore Pain Intervention(s): Limited activity within patient's tolerance;Monitored during session;Repositioned;Ice applied    Home Living                      Prior Function            PT Goals (current goals can now be found in the care plan section) Acute Rehab PT Goals Patient Stated Goal: home PT Goal Formulation: With patient Time For Goal Achievement: 08/14/21 Potential to Achieve Goals: Good Progress towards PT goals: Progressing toward goals    Frequency    7X/week      PT Plan Current plan remains appropriate    Co-evaluation  AM-PAC PT "6 Clicks" Mobility   Outcome Measure  Help needed turning from your back to your side while in a flat bed without using bedrails?: A Little Help needed moving from lying on your back to sitting on the side of a flat bed without using bedrails?: A Little Help needed moving to and from a bed to a chair (including a wheelchair)?: A Little Help needed standing up from a chair using your arms (e.g., wheelchair or bedside chair)?: A Little Help needed to walk in hospital room?: A Little Help needed climbing 3-5 steps with a railing? : A Little 6 Click Score: 18    End of Session Equipment Utilized During Treatment: Gait belt;Right knee immobilizer Activity Tolerance: Patient limited by fatigue Patient left: in  chair;with call bell/phone within reach;with chair alarm set Nurse Communication: Mobility status PT Visit Diagnosis: Other abnormalities of gait and mobility (R26.89)     Time: 9935-7017 PT Time Calculation (min) (ACUTE ONLY): 26 min  Charges:  $Gait Training: 8-22 mins $Therapeutic Exercise: 8-22 mins                     Baxter Flattery, PT  Acute Rehab Dept ([place]) [phone] Pager [phone]  08/08/2021    [facility] 08/08/2021, 12:57 PM

## 2021-08-09 LAB — CBC
HCT: 31.7 % — ABNORMAL LOW (ref 36.0–46.0)
Hemoglobin: 10.6 g/dL — ABNORMAL LOW (ref 12.0–15.0)
MCH: 30.8 pg (ref 26.0–34.0)
MCHC: 33.4 g/dL (ref 30.0–36.0)
MCV: 92.2 fL (ref 80.0–100.0)
Platelets: 206 10*3/uL (ref 150–400)
RBC: 3.44 MIL/uL — ABNORMAL LOW (ref 3.87–5.11)
RDW: 13.9 % (ref 11.5–15.5)
WBC: 14.3 10*3/uL — ABNORMAL HIGH (ref 4.0–10.5)
nRBC: 0 % (ref 0.0–0.2)

## 2021-08-09 LAB — BASIC METABOLIC PANEL
Anion gap: 4 — ABNORMAL LOW (ref 5–15)
BUN: 14 mg/dL (ref 8–23)
CO2: 28 mmol/L (ref 22–32)
Calcium: 9.3 mg/dL (ref 8.9–10.3)
Chloride: 104 mmol/L (ref 98–111)
Creatinine, Ser: 0.53 mg/dL (ref 0.44–1.00)
GFR, Estimated: >60 mL/min (ref >60)
Glucose, Bld: 135 mg/dL — ABNORMAL HIGH (ref 70–99)
Potassium: 3.6 mmol/L (ref 3.5–5.1)
Sodium: 136 mmol/L (ref 135–145)

## 2021-08-09 MED ORDER — ASPIRIN 325 MG PO TBEC: 325.0000 mg | DELAYED_RELEASE_TABLET | Freq: Two times a day (BID) | ORAL | 0 refills | Status: AC

## 2021-08-09 MED ORDER — GABAPENTIN 300 MG PO CAPS: ORAL_CAPSULE | ORAL | 0 refills | Status: DC

## 2021-08-09 MED ORDER — TRAMADOL HCL 50 MG PO TABS: 50.0000 mg | ORAL_TABLET | Freq: Four times a day (QID) | ORAL | 0 refills | Status: DC | PRN

## 2021-08-09 MED ORDER — HYDROMORPHONE HCL 2 MG PO TABS: 2.0000 mg | ORAL_TABLET | Freq: Four times a day (QID) | ORAL | 0 refills | Status: DC | PRN

## 2021-08-09 MED ORDER — METHOCARBAMOL 500 MG PO TABS: 500.0000 mg | ORAL_TABLET | Freq: Four times a day (QID) | ORAL | 0 refills | Status: DC | PRN

## 2021-08-09 NOTE — Progress Notes (Signed)
   Subjective: 2 Days Post-Op Procedure(s) (LRB): TOTAL KNEE ARTHROPLASTY (Right) Patient reports pain as mild.   Patient seen in rounds for Dr. Wynelle Link. Patient is well, and has had no acute complaints or problems. Denies chest pain or SOB. No issues overnight. Plan is to go Home after hospital stay.  Objective: Vital signs in last 24 hours: Temp:  [97.9 F (36.6 C)-98.5 F (36.9 C)] 98 F (36.7 C) (09/21 0544) Pulse Rate:  [53-73] 73 (09/21 0544) Resp:  [18] 18 (09/21 0544) BP: (158-165)/(69-77) 158/69 (09/21 0544) SpO2:  [99 %-100 %] 99 % (09/21 0544)  Intake/Output from previous day:  Intake/Output Summary (Last 24 hours) at 08/09/2021 0841 Last data filed at 08/09/2021 0200 Gross per 24 hour  Intake 670.2 ml  Output --  Net 670.2 ml    Intake/Output this shift: No intake/output data recorded.  Labs: Recent Labs    08/08/21 0330 08/09/21 0320  HGB 10.9* 10.6*   Recent Labs    08/08/21 0330 08/09/21 0320  WBC 12.2* 14.3*  RBC 3.48* 3.44*  HCT 32.1* 31.7*  PLT 199 206   Recent Labs    08/08/21 0330 08/09/21 0320  NA 137 136  K 3.3* 3.6  CL 105 104  CO2 26 28  BUN 11 14  CREATININE 0.71 0.53  GLUCOSE 166* 135*  CALCIUM 9.2 9.3   No results for input(s): LABPT, INR in the last 72 hours.  Exam: General - Patient is Alert and Oriented Extremity - Neurologically intact Neurovascular intact Sensation intact distally Dorsiflexion/Plantar flexion intact Dressing/Incision - clean, dry, no drainage. Bulky dressing removed, aquacel in place. Motor Function - intact, moving foot and toes well on exam.   Past Medical History:  Diagnosis Date   Anemia    Arthritis    Carotid artery aneurysm ([place]) 11/20/2003   History of blood transfusion    HTN (hypertension)    Migraines    Osteopenia    PONV (postoperative nausea and vomiting)    also states "SLOW TO WAKE UP"   Pre-diabetes    Rheumatoid arthritis(714.0)    Sleep apnea    hasnt  used CPAP in  years   Trichomonas infection    Vertigo    Vitamin D deficiency     Assessment/Plan: 2 Days Post-Op Procedure(s) (LRB): TOTAL KNEE ARTHROPLASTY (Right) Active Problems:   Primary osteoarthritis of right knee  Estimated body mass index is 35.93 kg/m as calculated from the following:   Height as of this encounter: 5\' 3"  (1.6 m).   Weight as of this encounter: 92 kg. Up with therapy  DVT Prophylaxis - Aspirin Weight-bearing as tolerated  Plan for discharge to home after two sessions of physical therapy today. Scheduled for OPPT beginning tomorrow Follow-up in the office in 2 weeks  The PDMP database was reviewed today prior to any opioid medications being prescribed to this patient.  Theresa Duty, PA-C Orthopedic Surgery [phone] 08/09/2021, 8:41 AM

## 2021-08-09 NOTE — Plan of Care (Signed)
  Problem: Pain Managment: Goal: General experience of comfort will improve Outcome: Progressing   Problem: Coping: Goal: Level of anxiety will decrease Outcome: Progressing   

## 2021-08-09 NOTE — Plan of Care (Signed)
  Problem: Clinical Measurements: Goal: Ability to maintain clinical measurements within normal limits will improve Outcome: Progressing   Problem: Pain Managment: Goal: General experience of comfort will improve Outcome: Progressing   Problem: Activity: Goal: Ability to avoid complications of mobility impairment will improve Outcome: Progressing

## 2021-08-09 NOTE — Progress Notes (Signed)
Physical Therapy Treatment Patient Details Name: Joanna Lee MRN: [id number] DOB: 1951/04/13 Today's Date: 08/09/2021   History of Present Illness s/p R TKA. PMH: L TKA    PT Comments    Pt progressing well. Good carryover with gait, transfer safety. Ready to d/c from PT standpoint with family assist.    Recommendations for follow up therapy are one component of a multi-disciplinary discharge planning process, led by the attending physician.  Recommendations may be updated based on patient status, additional functional criteria and insurance authorization.  Follow Up Recommendations  Follow surgeon's recommendation for DC plan and follow-up therapies     Equipment Recommendations  Rolling walker with 5" wheels    Recommendations for Other Services       Precautions / Restrictions Precautions Precautions: Fall;Knee Required Braces or Orthoses: Knee Immobilizer - Right Knee Immobilizer - Right: Discontinue once straight leg raise with < 10 degree lag Restrictions RLE Weight Bearing: Weight bearing as tolerated     Mobility  Bed Mobility               General bed mobility comments: in recliner    Transfers Overall transfer level: Needs assistance Equipment used: Rolling walker (2 wheeled) Transfers: Sit to/from Stand Sit to Stand: Supervision         General transfer comment: cues for hand placement and RLE position  Ambulation/Gait Ambulation/Gait assistance: Supervision Gait Distance (Feet): 20 Feet (x2) Assistive device: Rolling walker (2 wheeled) Gait Pattern/deviations: Step-to pattern     General Gait Details: good carryover from previous sessions, good RW positioning and sequencing   Stairs             Wheelchair Mobility    Modified Rankin (Stroke Patients Only)       Balance                                            Cognition Arousal/Alertness: Awake/alert Behavior During Therapy: WFL for tasks  assessed/performed Overall Cognitive Status: Within Functional Limits for tasks assessed                                        Exercises Total Joint Exercises Ankle Circles/Pumps: AROM;Both;10 reps Quad Sets: AROM;Both;10 reps Heel Slides: AROM;AAROM;Left;10 reps Hip ABduction/ADduction: AROM;Right;10 reps Straight Leg Raises: AROM;Right;10 reps;AAROM    General Comments        Pertinent Vitals/Pain Pain Assessment: Faces Faces Pain Scale: Hurts little more (with activity) Pain Location: right knee Pain Descriptors / Indicators: Discomfort;Grimacing;Sore Pain Intervention(s): Limited activity within patient's tolerance;Monitored during session;Repositioned    Home Living                      Prior Function            PT Goals (current goals can now be found in the care plan section) Acute Rehab PT Goals Patient Stated Goal: home PT Goal Formulation: With patient Time For Goal Achievement: 08/14/21 Potential to Achieve Goals: Good Progress towards PT goals: Progressing toward goals    Frequency    7X/week      PT Plan Current plan remains appropriate    Co-evaluation              AM-PAC PT "6 Clicks" Mobility  Outcome Measure  Help needed turning from your back to your side while in a flat bed without using bedrails?: A Little Help needed moving from lying on your back to sitting on the side of a flat bed without using bedrails?: A Little Help needed moving to and from a bed to a chair (including a wheelchair)?: A Little Help needed standing up from a chair using your arms (e.g., wheelchair or bedside chair)?: A Little Help needed to walk in hospital room?: A Little Help needed climbing 3-5 steps with a railing? : A Little 6 Click Score: 18    End of Session Equipment Utilized During Treatment: Gait belt;Right knee immobilizer Activity Tolerance: Patient limited by fatigue Patient left: in chair;with call bell/phone  within reach;with chair alarm set Nurse Communication: Mobility status PT Visit Diagnosis: Other abnormalities of gait and mobility (R26.89)     Time: 3338-3291 PT Time Calculation (min) (ACUTE ONLY): 15 min  Charges:  $Therapeutic Exercise: 8-22 mins                     Baxter Flattery, PT  Acute Rehab Dept ([place]) [phone] Pager [phone]  08/09/2021    [facility] 08/09/2021, 3:56 PM

## 2021-08-09 NOTE — Progress Notes (Signed)
Physical Therapy Treatment Patient Details Name: Joanna Lee MRN: [id number] DOB: 16-Nov-1951 Today's Date: 08/09/2021   History of Present Illness s/p R TKA. PMH: L TKA    PT Comments    Pt progressing well this am. Should be ready for d/c  after second PT session later today.   Recommendations for follow up therapy are one component of a multi-disciplinary discharge planning process, led by the attending physician.  Recommendations may be updated based on patient status, additional functional criteria and insurance authorization.  Follow Up Recommendations  Follow surgeon's recommendation for DC plan and follow-up therapies     Equipment Recommendations  Rolling walker with 5" wheels    Recommendations for Other Services       Precautions / Restrictions Precautions Precautions: Fall;Knee Required Braces or Orthoses: Knee Immobilizer - Right Knee Immobilizer - Right: Discontinue once straight leg raise with < 10 degree lag Restrictions Weight Bearing Restrictions: No RLE Weight Bearing: Weight bearing as tolerated     Mobility  Bed Mobility Overal bed mobility: Modified Independent Bed Mobility: Supine to Sit     Supine to sit: Modified independent (Device/Increase time);HOB elevated          Transfers Overall transfer level: Needs assistance Equipment used: Rolling walker (2 wheeled) Transfers: Sit to/from Stand Sit to Stand: Supervision         General transfer comment: cues for hand placement and RLE position  Ambulation/Gait Ambulation/Gait assistance: Min guard;Supervision Gait Distance (Feet): 80 Feet Assistive device: Rolling walker (2 wheeled) Gait Pattern/deviations: Step-to pattern     General Gait Details: cues for sequence and RW position   Stairs             Wheelchair Mobility    Modified Rankin (Stroke Patients Only)       Balance                                            Cognition  Arousal/Alertness: Awake/alert Behavior During Therapy: WFL for tasks assessed/performed Overall Cognitive Status: Within Functional Limits for tasks assessed                                        Exercises      General Comments        Pertinent Vitals/Pain Pain Assessment: Faces Faces Pain Scale: Hurts little more (with activity) Pain Location: right knee Pain Descriptors / Indicators: Discomfort;Grimacing;Sore Pain Intervention(s): Limited activity within patient's tolerance;Monitored during session;Premedicated before session;Repositioned;Ice applied    Home Living                      Prior Function            PT Goals (current goals can now be found in the care plan section) Acute Rehab PT Goals Patient Stated Goal: home PT Goal Formulation: With patient Time For Goal Achievement: 08/14/21 Potential to Achieve Goals: Good Progress towards PT goals: Progressing toward goals    Frequency    7X/week      PT Plan Current plan remains appropriate    Co-evaluation              AM-PAC PT "6 Clicks" Mobility   Outcome Measure  Help needed turning from your back to your side while  in a flat bed without using bedrails?: A Little Help needed moving from lying on your back to sitting on the side of a flat bed without using bedrails?: A Little Help needed moving to and from a bed to a chair (including a wheelchair)?: A Little Help needed standing up from a chair using your arms (e.g., wheelchair or bedside chair)?: A Little Help needed to walk in hospital room?: A Little Help needed climbing 3-5 steps with a railing? : A Little 6 Click Score: 18    End of Session Equipment Utilized During Treatment: Gait belt;Right knee immobilizer Activity Tolerance: Patient limited by fatigue Patient left: in chair;with call bell/phone within reach;with chair alarm set Nurse Communication: Mobility status PT Visit Diagnosis: Other abnormalities  of gait and mobility (R26.89)     Time: 9923-4144 PT Time Calculation (min) (ACUTE ONLY): 17 min  Charges:                        Baxter Flattery, PT  Acute Rehab Dept ([place]) [phone] Pager [phone]  08/09/2021    [facility] 08/09/2021, 11:45 AM

## 2021-08-09 NOTE — Plan of Care (Signed)
Discharge instructions given to the patient. °

## 2021-08-10 DIAGNOSIS — M25661 Stiffness of right knee, not elsewhere classified: Secondary | ICD-10-CM | POA: Diagnosis not present

## 2021-08-10 DIAGNOSIS — M25561 Pain in right knee: Secondary | ICD-10-CM | POA: Diagnosis not present

## 2021-08-14 DIAGNOSIS — M25561 Pain in right knee: Secondary | ICD-10-CM | POA: Diagnosis not present

## 2021-08-14 DIAGNOSIS — M25661 Stiffness of right knee, not elsewhere classified: Secondary | ICD-10-CM | POA: Diagnosis not present

## 2021-08-14 NOTE — Discharge Summary (Signed)
Physician Discharge Summary   Patient ID: Joanna Lee MRN: [id number] DOB/AGE: 71/04/1951 71 y.o.  Admit date: 08/07/2021 Discharge date: 08/09/2021  Primary Diagnosis: Osteoarthritis, right knee   Admission Diagnoses:  Past Medical History:  Diagnosis Date   Anemia    Arthritis    Carotid artery aneurysm ([place]) 11/20/2003   History of blood transfusion    HTN (hypertension)    Migraines    Osteopenia    PONV (postoperative nausea and vomiting)    also states "SLOW TO WAKE UP"   Pre-diabetes    Rheumatoid arthritis(714.0)    Sleep apnea    hasnt  used CPAP in years   Trichomonas infection    Vertigo    Vitamin D deficiency    Discharge Diagnoses:   Active Problems:   Primary osteoarthritis of right knee  Estimated body mass index is 35.93 kg/m as calculated from the following:   Height as of this encounter: 5\' 3"  (1.6 m).   Weight as of this encounter: 92 kg.  Procedure:  Procedure(s) (LRB): TOTAL KNEE ARTHROPLASTY (Right)   Consults: None  HPI: Joanna Lee is a 70 y.o. year old female with end stage OA of her right knee with progressively worsening pain and dysfunction. She has constant pain, with activity and at rest and significant functional deficits with difficulties even with ADLs. She has had extensive non-op management including analgesics, injections of cortisone and viscosupplements, and home exercise program, but remains in significant pain with significant dysfunction.Radiographs show bone on bone arthritis medial and patellofemoral. She presents now for right Total Knee Arthroplasty.  Laboratory Data: Admission on 08/07/2021, Discharged on 08/09/2021  Component Date Value Ref Range Status   Glucose-Capillary 08/07/2021 167 (A) 70 - 99 mg/dL Final   Glucose reference range applies only to samples taken after fasting for at least 8 hours.   WBC 08/08/2021 12.2 (A) 4.0 - 10.5 K/uL Final   RBC 08/08/2021 3.48 (A) 3.87 - 5.11 MIL/uL Final   Hemoglobin  08/08/2021 10.9 (A) 12.0 - 15.0 g/dL Final   HCT 08/08/2021 32.1 (A) 36.0 - 46.0 % Final   MCV 08/08/2021 92.2  80.0 - 100.0 fL Final   MCH 08/08/2021 31.3  26.0 - 34.0 pg Final   MCHC 08/08/2021 34.0  30.0 - 36.0 g/dL Final   RDW 08/08/2021 13.7  11.5 - 15.5 % Final   Platelets 08/08/2021 199  150 - 400 K/uL Final   nRBC 08/08/2021 0.0  0.0 - 0.2 % Final   Performed at [facility], [place] [address] [place], [place] [address]   Sodium 08/08/2021 137  135 - 145 mmol/L Final   Potassium 08/08/2021 3.3 (A) 3.5 - 5.1 mmol/L Final   Chloride 08/08/2021 105  98 - 111 mmol/L Final   CO2 08/08/2021 26  22 - 32 mmol/L Final   Glucose, Bld 08/08/2021 166 (A) 70 - 99 mg/dL Final   Glucose reference range applies only to samples taken after fasting for at least 8 hours.   BUN 08/08/2021 11  8 - 23 mg/dL Final   Creatinine, Ser 08/08/2021 0.71  0.44 - 1.00 mg/dL Final   Calcium 08/08/2021 9.2  8.9 - 10.3 mg/dL Final   GFR, Estimated 08/08/2021 >60  >60 mL/min Final   Comment: (NOTE) Calculated using the CKD-EPI Creatinine Equation (2021)    Anion gap 08/08/2021 6  5 - 15 Final   Performed at [facility], [place] [address], [place], [place] [address]  WBC 08/09/2021 14.3 (A) 4.0 - 10.5 K/uL Final   RBC 08/09/2021 3.44 (A) 3.87 - 5.11 MIL/uL Final   Hemoglobin 08/09/2021 10.6 (A) 12.0 - 15.0 g/dL Final   HCT 08/09/2021 31.7 (A) 36.0 - 46.0 % Final   MCV 08/09/2021 92.2  80.0 - 100.0 fL Final   MCH 08/09/2021 30.8  26.0 - 34.0 pg Final   MCHC 08/09/2021 33.4  30.0 - 36.0 g/dL Final   RDW 08/09/2021 13.9  11.5 - 15.5 % Final   Platelets 08/09/2021 206  150 - 400 K/uL Final   nRBC 08/09/2021 0.0  0.0 - 0.2 % Final   Performed at [facility], [place] [address] [place], [place] [address]   Sodium 08/09/2021 136  135 - 145 mmol/L Final   Potassium 08/09/2021 3.6  3.5 - 5.1 mmol/L Final   Chloride 08/09/2021 104  98 - 111 mmol/L Final   CO2  08/09/2021 28  22 - 32 mmol/L Final   Glucose, Bld 08/09/2021 135 (A) 70 - 99 mg/dL Final   Glucose reference range applies only to samples taken after fasting for at least 8 hours.   BUN 08/09/2021 14  8 - 23 mg/dL Final   Creatinine, Ser 08/09/2021 0.53  0.44 - 1.00 mg/dL Final   Calcium 08/09/2021 9.3  8.9 - 10.3 mg/dL Final   GFR, Estimated 08/09/2021 >60  >60 mL/min Final   Comment: (NOTE) Calculated using the CKD-EPI Creatinine Equation (2021)    Anion gap 08/09/2021 4 (A) 5 - 15 Final   Performed at [facility], [place] [address], [place], [place] [address]  Orders Only on 08/03/2021  Component Date Value Ref Range Status   SARS Coronavirus 2 08/03/2021 RESULT: NEGATIVE   Final   Comment: RESULT: NEGATIVESARS-CoV-2 INTERPRETATION:A NEGATIVE  test result means that SARS-CoV-2 RNA was not present in the specimen above the limit of detection of this test. This does not preclude a possible SARS-CoV-2 infection and should not be used as the  sole basis for patient management decisions. Negative results must be combined with clinical observations, patient history, and epidemiological information. Optimum specimen types and timing for peak viral levels during infections caused by SARS-CoV-2  have not been determined. Collection of multiple specimens or types of specimens may be necessary to detect virus. Improper specimen collection and handling, sequence variability under primers/probes, or organism present below the limit of detection may  lead to false negative results. Positive and negative predictive values of testing are highly dependent on prevalence. False negative test results are more likely when prevalence of disease is high.The expected result is NEGATIVE.Fact S                          heet for  Healthcare Providers: LocalChronicle.no Sheet for Patients: SalonLookup.es Reference Range - Negative    Hospital Outpatient Visit on 07/25/2021  Component Date Value Ref Range Status   MRSA, PCR 07/25/2021 NEGATIVE  NEGATIVE Final   Staphylococcus aureus 07/25/2021 NEGATIVE  NEGATIVE Final   Comment: (NOTE) The Xpert SA Assay (FDA approved for NASAL specimens in patients 40 years of age and older), is one component of a comprehensive surveillance program. It is not intended to diagnose infection nor to guide or monitor treatment. Performed at [facility], [place] [address], [place], [place] [address]    WBC 07/25/2021 7.3  4.0 - 10.5 K/uL Final   RBC 07/25/2021 4.33  3.87 - 5.11  MIL/uL Final   Hemoglobin 07/25/2021 13.4  12.0 - 15.0 g/dL Final   HCT 07/25/2021 41.4  36.0 - 46.0 % Final   MCV 07/25/2021 95.6  80.0 - 100.0 fL Final   MCH 07/25/2021 30.9  26.0 - 34.0 pg Final   MCHC 07/25/2021 32.4  30.0 - 36.0 g/dL Final   RDW 07/25/2021 13.8  11.5 - 15.5 % Final   Platelets 07/25/2021 244  150 - 400 K/uL Final   nRBC 07/25/2021 0.0  0.0 - 0.2 % Final   Performed at [facility], [place] [address], [place], [place] [address]   Sodium 07/25/2021 140  135 - 145 mmol/L Final   Potassium 07/25/2021 3.4 (A) 3.5 - 5.1 mmol/L Final   Chloride 07/25/2021 104  98 - 111 mmol/L Final   CO2 07/25/2021 29  22 - 32 mmol/L Final   Glucose, Bld 07/25/2021 112 (A) 70 - 99 mg/dL Final   Glucose reference range applies only to samples taken after fasting for at least 8 hours.   BUN 07/25/2021 15  8 - 23 mg/dL Final   Creatinine, Ser 07/25/2021 0.79  0.44 - 1.00 mg/dL Final   Calcium 07/25/2021 10.8 (A) 8.9 - 10.3 mg/dL Final   Total Protein 07/25/2021 7.4  6.5 - 8.1 g/dL Final   Albumin 07/25/2021 3.8  3.5 - 5.0 g/dL Final   AST 07/25/2021 19  15 - 41 U/L Final   ALT 07/25/2021 20  0 - 44 U/L Final   Alkaline Phosphatase 07/25/2021 67  38 - 126 U/L Final   Total Bilirubin 07/25/2021 0.6  0.3 - 1.2 mg/dL Final   GFR, Estimated 07/25/2021 >60  >60 mL/min Final    Comment: (NOTE) Calculated using the CKD-EPI Creatinine Equation (2021)    Anion gap 07/25/2021 7  5 - 15 Final   Performed at [facility], [place] [address] [place], [place] [address]   Prothrombin Time 07/25/2021 12.6  11.4 - 15.2 seconds Final   INR 07/25/2021 0.9  0.8 - 1.2 Final   Comment: (NOTE) INR goal varies based on device and disease states. Performed at [facility], [place] [address], [place], [place] [address]    Hgb A1c MFr Bld 07/25/2021 5.9 (A) 4.8 - 5.6 % Final   Comment: (NOTE) Pre diabetes:          5.7%-6.4%  Diabetes:              >6.4%  Glycemic control for   <7.0% adults with diabetes    Mean Plasma Glucose 07/25/2021 122.63  mg/dL Final   Performed at [facility], [place] [address], [place], [place] [address]     X-Rays:No results found.  EKG: Orders placed or performed during the hospital encounter of 07/25/21   EKG 12 lead per protocol   EKG 12 lead per protocol     Hospital Course: Joanna Lee is a 70 y.o. who was admitted to [facility]. They were brought to the operating room on 08/07/2021 and underwent Procedure(s): TOTAL KNEE ARTHROPLASTY.  Patient tolerated the procedure well and was later transferred to the recovery room and then to the orthopaedic floor for postoperative care. They were given PO and IV analgesics for pain control following their surgery. They were given 24 hours of postoperative antibiotics of  Anti-infectives (From admission, onward)    Start     Dose/Rate Route Frequency Ordered Stop   08/07/21 1430  ceFAZolin (ANCEF) IVPB  2g/100 mL premix        2 g 200 mL/hr over 30 Minutes Intravenous Every 6 hours 08/07/21 1246 08/07/21 2213   08/07/21 0700  ceFAZolin (ANCEF) IVPB 2g/100 mL premix        2 g 200 mL/hr over 30 Minutes Intravenous On call to O.R. 08/07/21 5809 08/07/21 0855      and started on DVT prophylaxis in the form of Aspirin.   PT and OT were ordered for total  joint protocol. Discharge planning consulted to help with postop disposition and equipment needs. Patient had a good night on the evening of surgery. They started to get up OOB with therapy on POD #0. Continued to work with therapy into POD #2. Pt was seen during rounds on day two and was ready to go home pending progress with therapy. Dressing was changed and the incision was clean, dry, and intact. Pt worked with therapy for two additional sessions and was meeting their goals. She was discharged to home later that day in stable condition.  Diet: Regular diet Activity: WBAT Follow-up: in 2 weeks Disposition: Home with OPPT Discharged Condition: stable   Discharge Instructions     Call MD / Call 911   Complete by: As directed    If you experience chest pain or shortness of breath, CALL 911 and be transported to the hospital emergency room.  If you develope a fever above 101 F, pus (white drainage) or increased drainage or redness at the wound, or calf pain, call your surgeon's office.   Change dressing   Complete by: As directed    You may remove the bulky bandage (ACE wrap and gauze) two days after surgery. You will have an adhesive waterproof bandage underneath. Leave this in place until your first follow-up appointment.   Constipation Prevention   Complete by: As directed    Drink plenty of fluids.  Prune juice may be helpful.  You may use a stool softener, such as Colace (over the counter) 100 mg twice a day.  Use MiraLax (over the counter) for constipation as needed.   Diet - low sodium heart healthy   Complete by: As directed    Do not put a pillow under the knee. Place it under the heel.   Complete by: As directed    Driving restrictions   Complete by: As directed    No driving for two weeks   Post-operative opioid taper instructions:   Complete by: As directed    POST-OPERATIVE OPIOID TAPER INSTRUCTIONS: It is important to wean off of your opioid medication as soon as possible.  If you do not need pain medication after your surgery it is ok to stop day one. Opioids include: Codeine, Hydrocodone(Norco, Vicodin), Oxycodone(Percocet, oxycontin) and hydromorphone amongst others.  Long term and even short term use of opiods can cause: Increased pain response Dependence Constipation Depression Respiratory depression And more.  Withdrawal symptoms can include Flu like symptoms Nausea, vomiting And more Techniques to manage these symptoms Hydrate well Eat regular healthy meals Stay active Use relaxation techniques(deep breathing, meditating, yoga) Do Not substitute Alcohol to help with tapering If you have been on opioids for less than two weeks and do not have pain than it is ok to stop all together.  Plan to wean off of opioids This plan should start within one week post op of your joint replacement. Maintain the same interval or time between taking each dose and first decrease the dose.  Cut the total  daily intake of opioids by one tablet each day Next start to increase the time between doses. The last dose that should be eliminated is the evening dose.      TED hose   Complete by: As directed    Use stockings (TED hose) for three weeks on both leg(s).  You may remove them at night for sleeping.   Weight bearing as tolerated   Complete by: As directed       Allergies as of 08/09/2021       Reactions   Codeine Other (See Comments)   Cant remember/ PASSED OUT per pt        Medication List     STOP taking these medications    aspirin 81 MG tablet Replaced by: aspirin 325 MG EC tablet       TAKE these medications    acetaminophen 325 MG tablet Commonly known as: TYLENOL Take 2 tablets (650 mg total) by mouth every 6 (six) hours as needed for mild pain (or Fever >/= 101).   aspirin 325 MG EC tablet Take 1 tablet (325 mg total) by mouth 2 (two) times daily for 19 days. Then resume one 81 mg aspirin once a day. Replaces: aspirin 81 MG  tablet   ASTRAGALUS PO Take 1 capsule by mouth daily as needed (winter cold prevention).   benzonatate 100 MG capsule Commonly known as: TESSALON Take 1 capsule (100 mg total) by mouth every 8 (eight) hours.   ECHINACEA PO Take 1 capsule by mouth daily as needed (winter cold prevention).   folic acid 1 MG tablet Commonly known as: FOLVITE Take 1 mg by mouth daily.   gabapentin 300 MG capsule Commonly known as: NEURONTIN Take a 300 mg capsule three times a day for two weeks following surgery.Then take a 300 mg capsule two times a day for two weeks. Then take a 300 mg capsule once a day for two weeks. Then discontinue.   hydrochlorothiazide 25 MG tablet Commonly known as: HYDRODIURIL Take 12.5 mg by mouth every morning.   HYDROmorphone 2 MG tablet Commonly known as: DILAUDID Take 1-2 tablets (2-4 mg total) by mouth every 6 (six) hours as needed for severe pain. Not to exceed 6 tablets a day.   losartan 50 MG tablet Commonly known as: COZAAR Take 50 mg by mouth daily.   methocarbamol 500 MG tablet Commonly known as: ROBAXIN Take 1 tablet (500 mg total) by mouth every 6 (six) hours as needed for muscle spasms.   methotrexate 2.5 MG tablet Commonly known as: RHEUMATREX Take 7.5 mg by mouth every Sunday.   montelukast 10 MG tablet Commonly known as: Singulair Take 1 tablet (10 mg total) by mouth at bedtime.   polyvinyl alcohol 1.4 % ophthalmic solution Commonly known as: LIQUIFILM TEARS Place 1 drop into both eyes as needed for dry eyes.   potassium chloride SA 20 MEQ tablet Commonly known as: KLOR-CON Take 20 mEq by mouth daily.   traMADol 50 MG tablet Commonly known as: ULTRAM Take 1-2 tablets (50-100 mg total) by mouth every 6 (six) hours as needed for moderate pain.   TURMERIC PO Take 1,000 mg by mouth daily.   Vitamin D 50 MCG (2000 UT) tablet Take 2,000 Units by mouth daily.               Discharge Care Instructions  (From admission, onward)            Start     Ordered   08/09/21  0000  Weight bearing as tolerated        08/09/21 0845   08/09/21 0000  Change dressing       Comments: You may remove the bulky bandage (ACE wrap and gauze) two days after surgery. You will have an adhesive waterproof bandage underneath. Leave this in place until your first follow-up appointment.   08/09/21 0845            Follow-up Information     Gaynelle Arabian, MD. Go on 08/22/2021.   Specialty: Orthopedic Surgery Why: You are scheduled for first post op appointment on Tuesday October 4th at 3:00pm. Contact information: [address] [place] [address] [phone]                 Signed: Theresa Duty, PA-C Orthopedic Surgery 08/14/2021, 2:00 PM

## 2021-08-16 DIAGNOSIS — M25661 Stiffness of right knee, not elsewhere classified: Secondary | ICD-10-CM | POA: Diagnosis not present

## 2021-08-16 DIAGNOSIS — M25561 Pain in right knee: Secondary | ICD-10-CM | POA: Diagnosis not present

## 2021-08-22 DIAGNOSIS — M25561 Pain in right knee: Secondary | ICD-10-CM | POA: Diagnosis not present

## 2021-08-22 DIAGNOSIS — M25661 Stiffness of right knee, not elsewhere classified: Secondary | ICD-10-CM | POA: Diagnosis not present

## 2021-08-29 DIAGNOSIS — M25661 Stiffness of right knee, not elsewhere classified: Secondary | ICD-10-CM | POA: Diagnosis not present

## 2021-08-31 DIAGNOSIS — M25661 Stiffness of right knee, not elsewhere classified: Secondary | ICD-10-CM | POA: Diagnosis not present

## 2021-08-31 DIAGNOSIS — M25561 Pain in right knee: Secondary | ICD-10-CM | POA: Diagnosis not present

## 2021-09-05 DIAGNOSIS — M25661 Stiffness of right knee, not elsewhere classified: Secondary | ICD-10-CM | POA: Diagnosis not present

## 2021-09-05 DIAGNOSIS — M25561 Pain in right knee: Secondary | ICD-10-CM | POA: Diagnosis not present

## 2021-09-08 DIAGNOSIS — M25561 Pain in right knee: Secondary | ICD-10-CM | POA: Diagnosis not present

## 2021-09-08 DIAGNOSIS — M25661 Stiffness of right knee, not elsewhere classified: Secondary | ICD-10-CM | POA: Diagnosis not present

## 2021-09-11 DIAGNOSIS — M25561 Pain in right knee: Secondary | ICD-10-CM | POA: Diagnosis not present

## 2021-09-12 DIAGNOSIS — Z471 Aftercare following joint replacement surgery: Secondary | ICD-10-CM | POA: Diagnosis not present

## 2021-09-12 DIAGNOSIS — Z96651 Presence of right artificial knee joint: Secondary | ICD-10-CM | POA: Diagnosis not present

## 2021-09-14 DIAGNOSIS — M25561 Pain in right knee: Secondary | ICD-10-CM | POA: Diagnosis not present

## 2021-09-19 DIAGNOSIS — M25561 Pain in right knee: Secondary | ICD-10-CM | POA: Diagnosis not present

## 2021-09-19 DIAGNOSIS — M25661 Stiffness of right knee, not elsewhere classified: Secondary | ICD-10-CM | POA: Diagnosis not present

## 2021-09-26 DIAGNOSIS — M25561 Pain in right knee: Secondary | ICD-10-CM | POA: Diagnosis not present

## 2021-09-28 DIAGNOSIS — M25561 Pain in right knee: Secondary | ICD-10-CM | POA: Diagnosis not present

## 2022-01-16 DIAGNOSIS — E785 Hyperlipidemia, unspecified: Secondary | ICD-10-CM | POA: Diagnosis not present

## 2022-01-16 DIAGNOSIS — M1990 Unspecified osteoarthritis, unspecified site: Secondary | ICD-10-CM | POA: Diagnosis not present

## 2022-01-16 DIAGNOSIS — I10 Essential (primary) hypertension: Secondary | ICD-10-CM | POA: Diagnosis not present

## 2022-01-31 DIAGNOSIS — G4733 Obstructive sleep apnea (adult) (pediatric): Secondary | ICD-10-CM | POA: Diagnosis not present

## 2022-01-31 DIAGNOSIS — E119 Type 2 diabetes mellitus without complications: Secondary | ICD-10-CM | POA: Diagnosis not present

## 2022-01-31 DIAGNOSIS — E785 Hyperlipidemia, unspecified: Secondary | ICD-10-CM | POA: Diagnosis not present

## 2022-01-31 DIAGNOSIS — R0789 Other chest pain: Secondary | ICD-10-CM | POA: Diagnosis not present

## 2022-01-31 DIAGNOSIS — I10 Essential (primary) hypertension: Secondary | ICD-10-CM | POA: Diagnosis not present

## 2022-03-12 DIAGNOSIS — E785 Hyperlipidemia, unspecified: Secondary | ICD-10-CM | POA: Diagnosis not present

## 2022-03-12 DIAGNOSIS — Z79899 Other long term (current) drug therapy: Secondary | ICD-10-CM | POA: Diagnosis not present

## 2022-03-12 DIAGNOSIS — E041 Nontoxic single thyroid nodule: Secondary | ICD-10-CM | POA: Diagnosis not present

## 2022-03-12 DIAGNOSIS — I10 Essential (primary) hypertension: Secondary | ICD-10-CM | POA: Diagnosis not present

## 2022-03-19 DIAGNOSIS — Z1331 Encounter for screening for depression: Secondary | ICD-10-CM | POA: Diagnosis not present

## 2022-03-19 DIAGNOSIS — E785 Hyperlipidemia, unspecified: Secondary | ICD-10-CM | POA: Diagnosis not present

## 2022-03-19 DIAGNOSIS — Z1212 Encounter for screening for malignant neoplasm of rectum: Secondary | ICD-10-CM | POA: Diagnosis not present

## 2022-03-19 DIAGNOSIS — E669 Obesity, unspecified: Secondary | ICD-10-CM | POA: Diagnosis not present

## 2022-03-19 DIAGNOSIS — Z1339 Encounter for screening examination for other mental health and behavioral disorders: Secondary | ICD-10-CM | POA: Diagnosis not present

## 2022-03-19 DIAGNOSIS — I10 Essential (primary) hypertension: Secondary | ICD-10-CM | POA: Diagnosis not present

## 2022-03-19 DIAGNOSIS — M1990 Unspecified osteoarthritis, unspecified site: Secondary | ICD-10-CM | POA: Diagnosis not present

## 2022-03-19 DIAGNOSIS — E042 Nontoxic multinodular goiter: Secondary | ICD-10-CM | POA: Diagnosis not present

## 2022-03-19 DIAGNOSIS — R82998 Other abnormal findings in urine: Secondary | ICD-10-CM | POA: Diagnosis not present

## 2022-03-19 DIAGNOSIS — Z0000 Encounter for general adult medical examination without abnormal findings: Secondary | ICD-10-CM | POA: Diagnosis not present

## 2022-05-30 DIAGNOSIS — M545 Low back pain, unspecified: Secondary | ICD-10-CM | POA: Diagnosis not present

## 2022-06-04 DIAGNOSIS — M9901 Segmental and somatic dysfunction of cervical region: Secondary | ICD-10-CM | POA: Diagnosis not present

## 2022-06-04 DIAGNOSIS — M9903 Segmental and somatic dysfunction of lumbar region: Secondary | ICD-10-CM | POA: Diagnosis not present

## 2022-06-04 DIAGNOSIS — S39012A Strain of muscle, fascia and tendon of lower back, initial encounter: Secondary | ICD-10-CM | POA: Diagnosis not present

## 2022-06-04 DIAGNOSIS — M9902 Segmental and somatic dysfunction of thoracic region: Secondary | ICD-10-CM | POA: Diagnosis not present

## 2022-06-04 DIAGNOSIS — M9904 Segmental and somatic dysfunction of sacral region: Secondary | ICD-10-CM | POA: Diagnosis not present

## 2022-06-05 DIAGNOSIS — S39012A Strain of muscle, fascia and tendon of lower back, initial encounter: Secondary | ICD-10-CM | POA: Diagnosis not present

## 2022-06-05 DIAGNOSIS — M9901 Segmental and somatic dysfunction of cervical region: Secondary | ICD-10-CM | POA: Diagnosis not present

## 2022-06-05 DIAGNOSIS — M9902 Segmental and somatic dysfunction of thoracic region: Secondary | ICD-10-CM | POA: Diagnosis not present

## 2022-06-05 DIAGNOSIS — M9904 Segmental and somatic dysfunction of sacral region: Secondary | ICD-10-CM | POA: Diagnosis not present

## 2022-06-05 DIAGNOSIS — M9903 Segmental and somatic dysfunction of lumbar region: Secondary | ICD-10-CM | POA: Diagnosis not present

## 2022-06-06 DIAGNOSIS — M9902 Segmental and somatic dysfunction of thoracic region: Secondary | ICD-10-CM | POA: Diagnosis not present

## 2022-06-06 DIAGNOSIS — M9904 Segmental and somatic dysfunction of sacral region: Secondary | ICD-10-CM | POA: Diagnosis not present

## 2022-06-06 DIAGNOSIS — M9901 Segmental and somatic dysfunction of cervical region: Secondary | ICD-10-CM | POA: Diagnosis not present

## 2022-06-06 DIAGNOSIS — M9903 Segmental and somatic dysfunction of lumbar region: Secondary | ICD-10-CM | POA: Diagnosis not present

## 2022-06-06 DIAGNOSIS — S39012A Strain of muscle, fascia and tendon of lower back, initial encounter: Secondary | ICD-10-CM | POA: Diagnosis not present

## 2022-06-11 DIAGNOSIS — M9904 Segmental and somatic dysfunction of sacral region: Secondary | ICD-10-CM | POA: Diagnosis not present

## 2022-06-11 DIAGNOSIS — M9903 Segmental and somatic dysfunction of lumbar region: Secondary | ICD-10-CM | POA: Diagnosis not present

## 2022-06-11 DIAGNOSIS — S39012A Strain of muscle, fascia and tendon of lower back, initial encounter: Secondary | ICD-10-CM | POA: Diagnosis not present

## 2022-06-11 DIAGNOSIS — M9902 Segmental and somatic dysfunction of thoracic region: Secondary | ICD-10-CM | POA: Diagnosis not present

## 2022-06-11 DIAGNOSIS — M9901 Segmental and somatic dysfunction of cervical region: Secondary | ICD-10-CM | POA: Diagnosis not present

## 2022-06-13 DIAGNOSIS — M9903 Segmental and somatic dysfunction of lumbar region: Secondary | ICD-10-CM | POA: Diagnosis not present

## 2022-06-13 DIAGNOSIS — M9904 Segmental and somatic dysfunction of sacral region: Secondary | ICD-10-CM | POA: Diagnosis not present

## 2022-06-13 DIAGNOSIS — M9902 Segmental and somatic dysfunction of thoracic region: Secondary | ICD-10-CM | POA: Diagnosis not present

## 2022-06-13 DIAGNOSIS — M9901 Segmental and somatic dysfunction of cervical region: Secondary | ICD-10-CM | POA: Diagnosis not present

## 2022-06-13 DIAGNOSIS — S39012A Strain of muscle, fascia and tendon of lower back, initial encounter: Secondary | ICD-10-CM | POA: Diagnosis not present

## 2022-06-18 DIAGNOSIS — E669 Obesity, unspecified: Secondary | ICD-10-CM | POA: Diagnosis not present

## 2022-06-19 DIAGNOSIS — M9901 Segmental and somatic dysfunction of cervical region: Secondary | ICD-10-CM | POA: Diagnosis not present

## 2022-06-19 DIAGNOSIS — M9902 Segmental and somatic dysfunction of thoracic region: Secondary | ICD-10-CM | POA: Diagnosis not present

## 2022-06-19 DIAGNOSIS — M9904 Segmental and somatic dysfunction of sacral region: Secondary | ICD-10-CM | POA: Diagnosis not present

## 2022-06-19 DIAGNOSIS — M9903 Segmental and somatic dysfunction of lumbar region: Secondary | ICD-10-CM | POA: Diagnosis not present

## 2022-06-19 DIAGNOSIS — S39012A Strain of muscle, fascia and tendon of lower back, initial encounter: Secondary | ICD-10-CM | POA: Diagnosis not present

## 2022-06-25 DIAGNOSIS — M9904 Segmental and somatic dysfunction of sacral region: Secondary | ICD-10-CM | POA: Diagnosis not present

## 2022-06-25 DIAGNOSIS — M9903 Segmental and somatic dysfunction of lumbar region: Secondary | ICD-10-CM | POA: Diagnosis not present

## 2022-06-25 DIAGNOSIS — M9902 Segmental and somatic dysfunction of thoracic region: Secondary | ICD-10-CM | POA: Diagnosis not present

## 2022-06-25 DIAGNOSIS — M9901 Segmental and somatic dysfunction of cervical region: Secondary | ICD-10-CM | POA: Diagnosis not present

## 2022-06-25 DIAGNOSIS — S39012A Strain of muscle, fascia and tendon of lower back, initial encounter: Secondary | ICD-10-CM | POA: Diagnosis not present

## 2022-07-02 DIAGNOSIS — M9903 Segmental and somatic dysfunction of lumbar region: Secondary | ICD-10-CM | POA: Diagnosis not present

## 2022-07-02 DIAGNOSIS — M9902 Segmental and somatic dysfunction of thoracic region: Secondary | ICD-10-CM | POA: Diagnosis not present

## 2022-07-02 DIAGNOSIS — M9901 Segmental and somatic dysfunction of cervical region: Secondary | ICD-10-CM | POA: Diagnosis not present

## 2022-07-02 DIAGNOSIS — S39012A Strain of muscle, fascia and tendon of lower back, initial encounter: Secondary | ICD-10-CM | POA: Diagnosis not present

## 2022-07-02 DIAGNOSIS — M9904 Segmental and somatic dysfunction of sacral region: Secondary | ICD-10-CM | POA: Diagnosis not present

## 2022-07-09 DIAGNOSIS — S39012A Strain of muscle, fascia and tendon of lower back, initial encounter: Secondary | ICD-10-CM | POA: Diagnosis not present

## 2022-07-09 DIAGNOSIS — M9902 Segmental and somatic dysfunction of thoracic region: Secondary | ICD-10-CM | POA: Diagnosis not present

## 2022-07-09 DIAGNOSIS — M9904 Segmental and somatic dysfunction of sacral region: Secondary | ICD-10-CM | POA: Diagnosis not present

## 2022-07-09 DIAGNOSIS — M9903 Segmental and somatic dysfunction of lumbar region: Secondary | ICD-10-CM | POA: Diagnosis not present

## 2022-07-09 DIAGNOSIS — M9901 Segmental and somatic dysfunction of cervical region: Secondary | ICD-10-CM | POA: Diagnosis not present

## 2022-07-16 DIAGNOSIS — M9904 Segmental and somatic dysfunction of sacral region: Secondary | ICD-10-CM | POA: Diagnosis not present

## 2022-07-16 DIAGNOSIS — M9903 Segmental and somatic dysfunction of lumbar region: Secondary | ICD-10-CM | POA: Diagnosis not present

## 2022-07-16 DIAGNOSIS — M9902 Segmental and somatic dysfunction of thoracic region: Secondary | ICD-10-CM | POA: Diagnosis not present

## 2022-07-16 DIAGNOSIS — M9901 Segmental and somatic dysfunction of cervical region: Secondary | ICD-10-CM | POA: Diagnosis not present

## 2022-07-16 DIAGNOSIS — S39012A Strain of muscle, fascia and tendon of lower back, initial encounter: Secondary | ICD-10-CM | POA: Diagnosis not present

## 2022-07-19 DIAGNOSIS — E669 Obesity, unspecified: Secondary | ICD-10-CM | POA: Diagnosis not present

## 2022-08-01 DIAGNOSIS — S39012A Strain of muscle, fascia and tendon of lower back, initial encounter: Secondary | ICD-10-CM | POA: Diagnosis not present

## 2022-08-01 DIAGNOSIS — M9902 Segmental and somatic dysfunction of thoracic region: Secondary | ICD-10-CM | POA: Diagnosis not present

## 2022-08-01 DIAGNOSIS — M9904 Segmental and somatic dysfunction of sacral region: Secondary | ICD-10-CM | POA: Diagnosis not present

## 2022-08-01 DIAGNOSIS — M9901 Segmental and somatic dysfunction of cervical region: Secondary | ICD-10-CM | POA: Diagnosis not present

## 2022-08-01 DIAGNOSIS — M9903 Segmental and somatic dysfunction of lumbar region: Secondary | ICD-10-CM | POA: Diagnosis not present

## 2022-08-07 DIAGNOSIS — M25512 Pain in left shoulder: Secondary | ICD-10-CM | POA: Diagnosis not present

## 2022-08-07 DIAGNOSIS — M75102 Unspecified rotator cuff tear or rupture of left shoulder, not specified as traumatic: Secondary | ICD-10-CM | POA: Diagnosis not present

## 2022-08-13 DIAGNOSIS — M9902 Segmental and somatic dysfunction of thoracic region: Secondary | ICD-10-CM | POA: Diagnosis not present

## 2022-08-13 DIAGNOSIS — M9904 Segmental and somatic dysfunction of sacral region: Secondary | ICD-10-CM | POA: Diagnosis not present

## 2022-08-13 DIAGNOSIS — S39012A Strain of muscle, fascia and tendon of lower back, initial encounter: Secondary | ICD-10-CM | POA: Diagnosis not present

## 2022-08-13 DIAGNOSIS — M9901 Segmental and somatic dysfunction of cervical region: Secondary | ICD-10-CM | POA: Diagnosis not present

## 2022-08-13 DIAGNOSIS — M9903 Segmental and somatic dysfunction of lumbar region: Secondary | ICD-10-CM | POA: Diagnosis not present

## 2022-08-18 DIAGNOSIS — E669 Obesity, unspecified: Secondary | ICD-10-CM | POA: Diagnosis not present

## 2022-08-20 DIAGNOSIS — M25512 Pain in left shoulder: Secondary | ICD-10-CM | POA: Diagnosis not present

## 2022-08-28 DIAGNOSIS — M25512 Pain in left shoulder: Secondary | ICD-10-CM | POA: Diagnosis not present

## 2022-08-29 ENCOUNTER — Other Ambulatory Visit (HOSPITAL_COMMUNITY): Payer: Self-pay | Admitting: Interventional Radiology

## 2022-08-29 DIAGNOSIS — I671 Cerebral aneurysm, nonruptured: Secondary | ICD-10-CM

## 2022-09-17 DIAGNOSIS — M25512 Pain in left shoulder: Secondary | ICD-10-CM | POA: Diagnosis not present

## 2022-09-18 ENCOUNTER — Ambulatory Visit (HOSPITAL_COMMUNITY)
Admission: RE | Admit: 2022-09-18 | Discharge: 2022-09-18 | Disposition: A | Discharge status: Home / Self Care | Payer: Medicare Other | Source: Ambulatory Visit | Attending: Interventional Radiology | Admitting: Interventional Radiology

## 2022-09-18 DIAGNOSIS — I671 Cerebral aneurysm, nonruptured: Secondary | ICD-10-CM | POA: Diagnosis not present

## 2022-09-18 DIAGNOSIS — M75102 Unspecified rotator cuff tear or rupture of left shoulder, not specified as traumatic: Secondary | ICD-10-CM | POA: Diagnosis not present

## 2022-09-18 DIAGNOSIS — I6523 Occlusion and stenosis of bilateral carotid arteries: Secondary | ICD-10-CM | POA: Diagnosis not present

## 2022-09-18 DIAGNOSIS — M25512 Pain in left shoulder: Secondary | ICD-10-CM | POA: Diagnosis not present

## 2022-09-18 MED ORDER — IOHEXOL 350 MG/ML SOLN
80.0000 mL | Freq: Once | INTRAVENOUS | Status: AC | PRN
  Administered 2022-09-18: 80 mL via INTRAVENOUS

## 2022-09-18 MED ORDER — SODIUM CHLORIDE (PF) 0.9 % IJ SOLN
INTRAMUSCULAR | Status: AC
  Filled 2022-09-18: qty 50

## 2022-09-24 ENCOUNTER — Telehealth (HOSPITAL_COMMUNITY): Payer: Self-pay

## 2022-09-24 NOTE — Telephone Encounter (Signed)
Called pt regarding recent imaging, no answer, left vm. AW  

## 2023-03-25 DIAGNOSIS — E785 Hyperlipidemia, unspecified: Secondary | ICD-10-CM | POA: Diagnosis not present

## 2023-03-25 DIAGNOSIS — E041 Nontoxic single thyroid nodule: Secondary | ICD-10-CM | POA: Diagnosis not present

## 2023-03-25 DIAGNOSIS — I10 Essential (primary) hypertension: Secondary | ICD-10-CM | POA: Diagnosis not present

## 2023-04-22 DIAGNOSIS — Z0000 Encounter for general adult medical examination without abnormal findings: Secondary | ICD-10-CM | POA: Diagnosis not present

## 2023-04-22 DIAGNOSIS — Z1331 Encounter for screening for depression: Secondary | ICD-10-CM | POA: Diagnosis not present

## 2023-04-22 DIAGNOSIS — E785 Hyperlipidemia, unspecified: Secondary | ICD-10-CM | POA: Diagnosis not present

## 2023-04-22 DIAGNOSIS — E669 Obesity, unspecified: Secondary | ICD-10-CM | POA: Diagnosis not present

## 2023-04-22 DIAGNOSIS — I77819 Aortic ectasia, unspecified site: Secondary | ICD-10-CM | POA: Diagnosis not present

## 2023-04-22 DIAGNOSIS — I10 Essential (primary) hypertension: Secondary | ICD-10-CM | POA: Diagnosis not present

## 2023-04-22 DIAGNOSIS — Z1339 Encounter for screening examination for other mental health and behavioral disorders: Secondary | ICD-10-CM | POA: Diagnosis not present

## 2023-04-22 DIAGNOSIS — E041 Nontoxic single thyroid nodule: Secondary | ICD-10-CM | POA: Diagnosis not present

## 2023-07-04 ENCOUNTER — Emergency Department (HOSPITAL_COMMUNITY)
Admission: EM | Admit: 2023-07-04 | Discharge: 2023-07-05 | Discharge status: Against Medical Advice | Payer: Medicare Other | Attending: Emergency Medicine | Admitting: Emergency Medicine

## 2023-07-04 ENCOUNTER — Other Ambulatory Visit: Payer: Self-pay

## 2023-07-04 ENCOUNTER — Encounter (HOSPITAL_COMMUNITY): Payer: Self-pay

## 2023-07-04 DIAGNOSIS — R9431 Abnormal electrocardiogram [ECG] [EKG]: Secondary | ICD-10-CM | POA: Insufficient documentation

## 2023-07-04 DIAGNOSIS — I499 Cardiac arrhythmia, unspecified: Secondary | ICD-10-CM | POA: Diagnosis not present

## 2023-07-04 DIAGNOSIS — Z5321 Procedure and treatment not carried out due to patient leaving prior to being seen by health care provider: Secondary | ICD-10-CM | POA: Diagnosis not present

## 2023-07-04 DIAGNOSIS — Z96651 Presence of right artificial knee joint: Secondary | ICD-10-CM | POA: Diagnosis not present

## 2023-07-04 NOTE — ED Triage Notes (Signed)
Says she presented to [facility] after her smart watch notified her of an a-fib rhythm.   Denies any cp, or hx of a.fib.   [facility] EKG she brought with her shows a sinus rhythm with an arrhythmia.   Says she was encouraged to ED since they did not have a doctor on staff to read it.

## 2023-07-05 NOTE — ED Notes (Signed)
Pt returns identification labels to registration alerting staff she was going to go home. Alert, oriented and displays no signs of distress. Ambulatory out of waiting area.

## 2023-07-08 DIAGNOSIS — I10 Essential (primary) hypertension: Secondary | ICD-10-CM | POA: Diagnosis not present

## 2023-07-08 DIAGNOSIS — E876 Hypokalemia: Secondary | ICD-10-CM | POA: Diagnosis not present

## 2023-07-08 DIAGNOSIS — R600 Localized edema: Secondary | ICD-10-CM | POA: Diagnosis not present

## 2023-07-08 DIAGNOSIS — I499 Cardiac arrhythmia, unspecified: Secondary | ICD-10-CM | POA: Diagnosis not present

## 2023-07-08 DIAGNOSIS — I493 Ventricular premature depolarization: Secondary | ICD-10-CM | POA: Diagnosis not present

## 2023-07-08 DIAGNOSIS — E042 Nontoxic multinodular goiter: Secondary | ICD-10-CM | POA: Diagnosis not present

## 2023-07-11 ENCOUNTER — Telehealth: Payer: Self-pay

## 2023-07-11 NOTE — Telephone Encounter (Signed)
Transition Care Management Unsuccessful Follow-up Telephone Call  Date of discharge and from where:  [facility] 8/16  Attempts:  1st Attempt  Reason for unsuccessful TCM follow-up call:  No answer/busy   [facility]  [facility], [facility] Guide, Phone: [phone] Website: [email or web address]

## 2023-07-12 ENCOUNTER — Telehealth: Payer: Self-pay

## 2023-07-12 NOTE — Telephone Encounter (Signed)
Transition Care Management Unsuccessful Follow-up Telephone Call  Date of discharge and from where:  [facility] 8/16  Attempts:  2nd Attempt  Reason for unsuccessful TCM follow-up call:  No answer/busy   [facility]  [facility], [facility] Guide, Phone: [phone] Website: [email or web address]

## 2023-07-15 ENCOUNTER — Other Ambulatory Visit: Payer: Medicare Other

## 2023-07-15 ENCOUNTER — Encounter: Payer: Self-pay | Admitting: Cardiology

## 2023-07-15 ENCOUNTER — Ambulatory Visit: Payer: Medicare Other | Admitting: Cardiology

## 2023-07-15 VITALS — BP 144/69 | HR 81 | Resp 16 | Ht 63.0 in | Wt 224.0 lb

## 2023-07-15 DIAGNOSIS — R002 Palpitations: Secondary | ICD-10-CM

## 2023-07-15 DIAGNOSIS — E079 Disorder of thyroid, unspecified: Secondary | ICD-10-CM | POA: Diagnosis not present

## 2023-07-15 DIAGNOSIS — I10 Essential (primary) hypertension: Secondary | ICD-10-CM

## 2023-07-15 DIAGNOSIS — I720 Aneurysm of carotid artery: Secondary | ICD-10-CM | POA: Diagnosis not present

## 2023-07-15 NOTE — Progress Notes (Signed)
ID:  Joanna Lee, DOB 09-21-1951, MRN [id number]  PCP:  Geoffry Paradise, MD  Cardiologist:  Tessa Lerner, DO, [facility] (established care 07/15/23)  REASON FOR CONSULT: Irregular heartbeat, PVCs, lower extremity swelling  REQUESTING PHYSICIAN:  Geoffry Paradise, MD [address] [place],  [place] [address]  Chief Complaint  Patient presents with   Palpitations   Leg Swelling    HPI  Joanna Lee is a 72 y.o. African-American female who presents to the clinic for evaluation of irregular heartbeat, PVCs at the request of Geoffry Paradise, MD. Her past medical history and cardiovascular risk factors include: Hypertension, RA, left ICA aneurysm s/p coiling, nontoxic multinodular goiter, BPV.  Around July 04, 2023 started noticing extra heartbeats and her watch alerted her for possible A-fib episodes.  She initially went to urgent care who later referred her to [facility] emergency room department.  EKG performed: Notes sinus rhythm without any significant ectopy.  She had left AMA due to prolonged wait times.  She is now referred to cardiology for further evaluation and management for irregular heartbeat & lower extremity swelling  Irregular heartbeat/extra beats: Since July 04, 2023. Symptoms last for few seconds. Appears several days of the week. No near-syncope or syncopal events. No significant consumption of coffee/caffeinated beverages. Denies use of tea, soda, illicits, alcohol, weight loss supplements, or energy drinks.  Lower extremity swelling: As noted improvement with elevation of the legs. Patient stated she cannot wear compression stockings. The swelling is better when she wakes up but not completely resolved. Denies orthopnea or PND.  No family history of premature coronary artery disease or sudden cardiac death.   FUNCTIONAL STATUS: No structured exercise program or daily routine.   CARDIAC DATABASE: EKG: 07/15/2023: Sinus rhythm, 70 bpm, LVH with voltage  criteria, nonspecific T wave abnormality likely secondary to strain.  Echocardiogram: No results found for this or any previous visit from the past 1095 days.     ALLERGIES: Allergies  Allergen Reactions   Codeine Other (See Comments)    Cant remember/ PASSED OUT per pt    MEDICATION LIST PRIOR TO VISIT: Current Meds  Medication Sig   acetaminophen (TYLENOL) 325 MG tablet Take 2 tablets (650 mg total) by mouth every 6 (six) hours as needed for mild pain (or Fever >/= 101).   Aspirin-Calcium Carbonate (BAYER WOMENS) 81-777 MG TABS Take 81 mg by mouth daily.   Cholecalciferol (VITAMIN D) 50 MCG (2000 UT) tablet Take 2,000 Units by mouth daily.   folic acid (FOLVITE) 1 MG tablet Take 1 mg by mouth daily.   hydrochlorothiazide (HYDRODIURIL) 25 MG tablet Take 12.5 mg by mouth every morning.   losartan (COZAAR) 50 MG tablet Take 50 mg by mouth daily.   methotrexate (RHEUMATREX) 2.5 MG tablet Take 7.5 mg by mouth every Sunday.   montelukast (SINGULAIR) 10 MG tablet Take 1 tablet (10 mg total) by mouth at bedtime.   polyvinyl alcohol (LIQUIFILM TEARS) 1.4 % ophthalmic solution Place 1 drop into both eyes as needed for dry eyes.   potassium chloride SA (KLOR-CON) 20 MEQ tablet Take 20 mEq by mouth daily.   TURMERIC PO Take 1,000 mg by mouth daily.     PAST MEDICAL HISTORY: Past Medical History:  Diagnosis Date   Anemia    Arthritis    Carotid artery aneurysm (HCC) 11/20/2003   History of blood transfusion    HTN (hypertension)    Migraines    Osteopenia    PONV (postoperative nausea and vomiting)  also states "SLOW TO WAKE UP"   Pre-diabetes    Rheumatoid arthritis(714.0)    Sleep apnea    hasnt  used CPAP in years   Trichomonas infection    Vertigo    Vitamin D deficiency     PAST SURGICAL HISTORY: Past Surgical History:  Procedure Laterality Date   ABDOMINAL HYSTERECTOMY     COMPLETE HYST   Coiling of Carotid Aneurysm  2005   CAROTID   KNEE SURGERY  2000/2009    Bilateral/ arthroscopy   SHOULDER SURGERY  1996   Left   TOTAL KNEE ARTHROPLASTY Left 02/08/2014   Procedure: LEFT TOTAL KNEE ARTHROPLASTY;  Surgeon: Loanne Drilling, MD;  Location: [facility];  Service: Orthopedics;  Laterality: Left;   TOTAL KNEE ARTHROPLASTY Right 08/07/2021   Procedure: TOTAL KNEE ARTHROPLASTY;  Surgeon: Ollen Gross, MD;  Location: [facility];  Service: Orthopedics;  Laterality: Right;    FAMILY HISTORY: The patient family history includes Asthma in her brother; Breast cancer in her mother; Colon cancer in her brother, sister, and sister; Diabetes in her mother and another family member; Gout in an other family member; Heart attack (age of onset: 27) in her brother; Hypertension in her mother and another family member; Lung cancer in her brother and father; Multiple sclerosis in her sister; Ovarian cancer in her sister.  SOCIAL HISTORY:  The patient  reports that she has never smoked. She has never used smokeless tobacco. She reports that she does not currently use alcohol after a past usage of about 1.0 standard drink of alcohol per week. She reports that she does not use drugs.  REVIEW OF SYSTEMS: Review of Systems  Cardiovascular:  Positive for leg swelling. Negative for chest pain, claudication, dyspnea on exertion, irregular heartbeat, near-syncope, orthopnea, palpitations, paroxysmal nocturnal dyspnea and syncope.  Respiratory:  Positive for shortness of breath (chronic).   Hematologic/Lymphatic: Negative for bleeding problem.  Musculoskeletal:  Negative for muscle cramps and myalgias.    PHYSICAL EXAM:    07/15/2023    2:15 PM 07/15/2023    2:06 PM 07/04/2023    9:46 PM  Vitals with BMI  Height  5\' 3"  5' 3.5"  Weight  224 lbs 215 lbs  BMI  39.69 37.48  Systolic 144 170 829  Diastolic 69 74 99  Pulse 81 71 87    Physical Exam  Constitutional: No distress.  Age appropriate, hemodynamically stable.   Neck: No JVD present.  Cardiovascular: Normal rate, regular  rhythm, S1 normal, S2 normal, intact distal pulses and normal pulses. Exam reveals no gallop, no S3 and no S4.  No murmur heard. Pulmonary/Chest: Effort normal and breath sounds normal. No stridor. She has no wheezes. She has no rales.  Abdominal: Soft. Bowel sounds are normal. She exhibits no distension. There is no abdominal tenderness.  Musculoskeletal:        General: No edema.     Cervical back: Neck supple.  Neurological: She is alert and oriented to person, place, and time. She has intact cranial nerves (2-12).  Skin: Skin is warm and moist.     LABORATORY DATA: External Labs: Collected: Mar 25, 2023 provided by PCP. TSH 2.27. Free T41.0  Collected: July 08, 2023 provided by PCP. Hemoglobin 12.9 g/dL BUN 11, creatinine 0.8. Sodium 138, potassium 3.8, chloride 105, bicarb 23. AST 21, ALT 19, alkaline phosphatase 85   IMPRESSION:    ICD-10-CM   1. Palpitations  R00.2 EKG 12-Lead    LONG TERM MONITOR (3-14 DAYS)  2. Benign hypertension  I10 PCV ECHOCARDIOGRAM COMPLETE    3. Carotid artery aneurysm (HCC)  I72.0     4. Thyroid disease  E07.9        RECOMMENDATIONS: MAMEDIARRA RUFENER is a 72 y.o. African-American female whose past medical history and cardiac risk factors include: Hypertension, RA, left ICA aneurysm s/p coiling, nontoxic multinodular goiter, BPV.  Palpitations Symptoms since August 2024. No near-syncope or syncopal events. No identifiable reversible cause. EKG shows sinus rhythm without ectopy. Extended Holter monitor to evaluate for dysrhythmias.  Benign hypertension Office blood pressures are not at goal. Currently on antihypertensive medications. Reemphasized importance of checking blood pressures at home. Reemphasized importance of low-salt diet. Currently managed by primary care provider. Echo will be ordered to evaluate for structural heart disease and left ventricular systolic function.   FINAL MEDICATION LIST END OF ENCOUNTER: No  orders of the defined types were placed in this encounter.   Medications Discontinued During This Encounter  Medication Reason   benzonatate (TESSALON) 100 MG capsule    gabapentin (NEURONTIN) 300 MG capsule    methocarbamol (ROBAXIN) 500 MG tablet    traMADol (ULTRAM) 50 MG tablet    HYDROmorphone (DILAUDID) 2 MG tablet      Current Outpatient Medications:    acetaminophen (TYLENOL) 325 MG tablet, Take 2 tablets (650 mg total) by mouth every 6 (six) hours as needed for mild pain (or Fever >/= 101)., Disp: 60 tablet, Rfl: 0   Aspirin-Calcium Carbonate (BAYER WOMENS) 81-777 MG TABS, Take 81 mg by mouth daily., Disp: , Rfl:    Cholecalciferol (VITAMIN D) 50 MCG (2000 UT) tablet, Take 2,000 Units by mouth daily., Disp: , Rfl:    folic acid (FOLVITE) 1 MG tablet, Take 1 mg by mouth daily., Disp: , Rfl:    hydrochlorothiazide (HYDRODIURIL) 25 MG tablet, Take 12.5 mg by mouth every morning., Disp: , Rfl:    losartan (COZAAR) 50 MG tablet, Take 50 mg by mouth daily., Disp: , Rfl:    methotrexate (RHEUMATREX) 2.5 MG tablet, Take 7.5 mg by mouth every Sunday., Disp: , Rfl:    montelukast (SINGULAIR) 10 MG tablet, Take 1 tablet (10 mg total) by mouth at bedtime., Disp: 30 tablet, Rfl: 0   polyvinyl alcohol (LIQUIFILM TEARS) 1.4 % ophthalmic solution, Place 1 drop into both eyes as needed for dry eyes., Disp: , Rfl:    potassium chloride SA (KLOR-CON) 20 MEQ tablet, Take 20 mEq by mouth daily., Disp: , Rfl:    TURMERIC PO, Take 1,000 mg by mouth daily., Disp: , Rfl:    ASTRAGALUS PO, Take 1 capsule by mouth daily as needed (winter cold prevention). (Patient not taking: Reported on 07/15/2023), Disp: , Rfl:    ECHINACEA PO, Take 1 capsule by mouth daily as needed (winter cold prevention). (Patient not taking: Reported on 07/15/2023), Disp: , Rfl:   Orders Placed This Encounter  Procedures   LONG TERM MONITOR (3-14 DAYS)   EKG 12-Lead   PCV ECHOCARDIOGRAM COMPLETE    There are no Patient  Instructions on file for this visit.   --Continue cardiac medications as reconciled in final medication list. --Return in about 6 weeks (around 08/26/2023) for Follow up, Palpitations, Review test results. or sooner if needed. --Continue follow-up with your primary care physician regarding the management of your other chronic comorbid conditions.  Patient's questions and concerns were addressed to her satisfaction. She voices understanding of the instructions provided during this encounter.   This note was created using  a voice recognition software as a result there may be grammatical errors inadvertently enclosed that do not reflect the nature of this encounter. Every attempt is made to correct such errors.  Tessa Lerner, [place], [facility]  Pager:  [phone] Office: [phone]

## 2023-07-19 ENCOUNTER — Other Ambulatory Visit: Payer: Federal, State, Local not specified - PPO

## 2023-07-29 DIAGNOSIS — R002 Palpitations: Secondary | ICD-10-CM | POA: Diagnosis not present

## 2023-08-07 ENCOUNTER — Telehealth: Payer: Self-pay

## 2023-08-08 ENCOUNTER — Telehealth: Payer: Self-pay

## 2023-08-08 DIAGNOSIS — R002 Palpitations: Secondary | ICD-10-CM | POA: Diagnosis not present

## 2023-08-08 DIAGNOSIS — I10 Essential (primary) hypertension: Secondary | ICD-10-CM

## 2023-08-08 NOTE — Addendum Note (Signed)
Addended by: Franchot Gallo on: 08/08/2023 05:41 PM   Modules accepted: Orders

## 2023-08-08 NOTE — Telephone Encounter (Signed)
See result note.  Joanna Lee [place], DO, [facility]

## 2023-08-08 NOTE — Telephone Encounter (Signed)
Patient called asking for her results of her monitor. Would you be able to read finalize it please.

## 2023-08-08 NOTE — Telephone Encounter (Signed)
Discussed heart monitor results with patient.  Per Dr. Odis Hollingshead: Tried calling her just now x2. Someone pick up but does not answer. Unable to keep a voicemail. Recommend low dose Toprol XL 25mg  po qday given her palpitations and PSVT burden 2.2% and will re-evaluate at the next visit.   Sunit Tolia, DO, [facility]   Patient states she does not want to start any medications until she knows why she is having these episodes of PSVT. She states she was supposed to have an echo performed, but this has been cancelled and was not rescheduled.  Patient states she does not have any palpitations, she does not feel when she is having episodes of PSVT. She is only aware of this from the readings on her fitness watch.  Will forward to Dr. Odis Hollingshead to review and advise.

## 2023-08-08 NOTE — Telephone Encounter (Signed)
Sure thing - we can wait. That's why I called her to discuss it further.  Please arrange the echo.   Hiawatha Merriott [place], DO, [facility]

## 2023-08-21 ENCOUNTER — Other Ambulatory Visit: Payer: Federal, State, Local not specified - PPO

## 2023-08-23 ENCOUNTER — Ambulatory Visit (HOSPITAL_COMMUNITY): Payer: Medicare Other | Attending: Cardiology

## 2023-08-23 DIAGNOSIS — R002 Palpitations: Secondary | ICD-10-CM | POA: Diagnosis not present

## 2023-08-23 DIAGNOSIS — I10 Essential (primary) hypertension: Secondary | ICD-10-CM | POA: Insufficient documentation

## 2023-08-23 LAB — ECHOCARDIOGRAM COMPLETE
Area-P 1/2: 3.2 cm2
S' Lateral: 3.3 cm

## 2023-08-23 MED ORDER — PERFLUTREN LIPID MICROSPHERE
1.0000 mL | INTRAVENOUS | Status: AC | PRN
Start: 2023-08-23 — End: 2023-08-23
  Administered 2023-08-23: 2 mL via INTRAVENOUS

## 2023-08-26 ENCOUNTER — Ambulatory Visit: Payer: Federal, State, Local not specified - PPO | Admitting: Cardiology

## 2023-08-27 ENCOUNTER — Ambulatory Visit: Payer: Medicare Other | Attending: Cardiology | Admitting: Cardiology

## 2023-08-27 ENCOUNTER — Encounter: Payer: Self-pay | Admitting: Cardiology

## 2023-08-27 VITALS — BP 130/72 | HR 86 | Resp 16 | Ht 63.0 in | Wt 221.6 lb

## 2023-08-27 DIAGNOSIS — I498 Other specified cardiac arrhythmias: Secondary | ICD-10-CM | POA: Diagnosis not present

## 2023-08-27 DIAGNOSIS — I10 Essential (primary) hypertension: Secondary | ICD-10-CM

## 2023-08-27 DIAGNOSIS — I471 Supraventricular tachycardia, unspecified: Secondary | ICD-10-CM

## 2023-08-27 DIAGNOSIS — R0609 Other forms of dyspnea: Secondary | ICD-10-CM | POA: Diagnosis not present

## 2023-08-27 DIAGNOSIS — Z01812 Encounter for preprocedural laboratory examination: Secondary | ICD-10-CM | POA: Diagnosis not present

## 2023-08-27 DIAGNOSIS — E079 Disorder of thyroid, unspecified: Secondary | ICD-10-CM | POA: Diagnosis not present

## 2023-08-27 MED ORDER — METOPROLOL TARTRATE 50 MG PO TABS: 50.0000 mg | ORAL_TABLET | Freq: Two times a day (BID) | ORAL | 0 refills | Status: DC

## 2023-08-27 MED ORDER — METOPROLOL TARTRATE 50 MG PO TABS
50.0000 mg | ORAL_TABLET | Freq: Two times a day (BID) | ORAL | 0 refills | Status: DC
Start: 2023-08-27 — End: 2023-10-04

## 2023-08-27 NOTE — Progress Notes (Signed)
Cardiology Office Note:  .   Date:  08/27/2023  ID:  Joanna Lee, DOB 05-31-1951, MRN [id number] PCP:  Geoffry Paradise, MD  Former Cardiology Providers: None [facility] Providers Cardiologist:  Tessa Lerner, DO , [facility] (established care 07/15/2023) Electrophysiologist:  None  Click to update primary MD,subspecialty MD or APP then REFRESH:1}    History of Present Illness: .   Joanna Lee is a 72 y.o. African-American female whose past medical history and cardiovascular risk factors includes:  Hypertension, RA, left ICA aneurysm s/p coiling, nontoxic multinodular goiter, BPV.   Patient was referred to the practice for evaluation of irregular heartbeat and lower extremity swelling.  Irregular heartbeat: Noticed in August 2024. Symptoms lasting for few seconds.  No near-syncope or syncopal events. Her watch alerted her for possible episodes of A-fib. She was referred to ED for further evaluation by a local urgent care but due to prolonged wait times left AMA. At the last office visit no identifiable reversible causes were identified Patient did undergo Zio patch and the results reviewed with her in detail and noted below for further reference. Since last office visit, no longer experiences palpitations but still gets alerts from her watch for " inconclusive rhythm" at times.  Lower extremity swelling: No orthopnea or PND. Usually improves with elevation of the legs when she wakes up in the morning. Patient states that she cannot wear compressive stockings. No change in intensity since last visit.  Patient also endorses dyspnea on exertion.  This has been chronic and stable but concerning for her.  She has had stress tests in the past at the [facility] which have either been inconclusive or nondiagnostic according to her.  I do not have old records available for review.   Review of Systems: .   Review of Systems  Cardiovascular:  Positive for dyspnea on exertion and leg  swelling. Negative for chest pain, claudication, irregular heartbeat, near-syncope, orthopnea, palpitations, paroxysmal nocturnal dyspnea and syncope.  Respiratory:  Negative for shortness of breath.   Hematologic/Lymphatic: Negative for bleeding problem.  Musculoskeletal:  Negative for muscle cramps and myalgias.  Neurological:  Negative for dizziness and light-headedness.    Studies Reviewed:   EKG: 07/15/2023: Sinus rhythm, 70 bpm, LVH with voltage criteria, nonspecific T wave abnormality likely secondary to strain.  Echocardiogram: August 23, 2023: LVEF 55 to 60%, grade 1 diastolic dysfunction, elevated left atrial pressures, LAE mildly dilated estimated RAP 3 mmHg, see report for additional details  Cardiac monitor: Cardiac monitor (Zio Patch): July 15, 2023 -July 22, 2023 Dominant rhythm sinus. Heart rate 45-179 bpm. Avg HR 68 bpm. No atrial fibrillation detected during the monitoring period. No ventricular tachycardia, high grade AV block, pauses (3 seconds or longer). Paroxysmal episodes of supraventricular tachycardia, asymptomatic, total supraventricular ectopic burden 2.2% (predominantly isolated beats). Total ventricular ectopic burden <1%. Patient triggered events: 0.   Risk Assessment/Calculations:   NA   Labs:       Latest Ref Rng & Units 08/09/2021    3:20 AM 08/08/2021    3:30 AM 07/25/2021    9:42 AM  CBC  WBC 4.0 - 10.5 K/uL 14.3  12.2  7.3   Hemoglobin 12.0 - 15.0 g/dL 40.9  81.1  91.4   Hematocrit 36.0 - 46.0 % 31.7  32.1  41.4   Platelets 150 - 400 K/uL 206  199  244        Latest Ref Rng & Units 08/09/2021    3:20 AM 08/08/2021  3:30 AM 07/25/2021    9:42 AM  BMP  Glucose 70 - 99 mg/dL 147  829  562   BUN 8 - 23 mg/dL 14  11  15    Creatinine 0.44 - 1.00 mg/dL 1.30  8.65  7.84   Sodium 135 - 145 mmol/L 136  137  140   Potassium 3.5 - 5.1 mmol/L 3.6  3.3  3.4   Chloride 98 - 111 mmol/L 104  105  104   CO2 22 - 32 mmol/L 28  26  29    Calcium  8.9 - 10.3 mg/dL 9.3  9.2  69.6       Latest Ref Rng & Units 08/09/2021    3:20 AM 08/08/2021    3:30 AM 07/25/2021    9:42 AM  CMP  Glucose 70 - 99 mg/dL 295  284  132   BUN 8 - 23 mg/dL 14  11  15    Creatinine 0.44 - 1.00 mg/dL 4.40  1.02  7.25   Sodium 135 - 145 mmol/L 136  137  140   Potassium 3.5 - 5.1 mmol/L 3.6  3.3  3.4   Chloride 98 - 111 mmol/L 104  105  104   CO2 22 - 32 mmol/L 28  26  29    Calcium 8.9 - 10.3 mg/dL 9.3  9.2  36.6   Total Protein 6.5 - 8.1 g/dL   7.4   Total Bilirubin 0.3 - 1.2 mg/dL   0.6   Alkaline Phos 38 - 126 U/L   67   AST 15 - 41 U/L   19   ALT 0 - 44 U/L   20     Lab Results  Component Value Date   CHOL (H) 03/18/2008    205        ATP III CLASSIFICATION:  <200     mg/dL   Desirable  440-347  mg/dL   Borderline High  >=425    mg/dL   High   HDL 61 95/63/8756   LDLCALC (H) 03/18/2008    128        Total Cholesterol/HDL:CHD Risk Coronary Heart Disease Risk Table                     Men   Women  1/2 Average Risk   3.4   3.3   TRIG 82 03/18/2008   CHOLHDL 3.4 03/18/2008   No results for input(s): "LIPOA" in the last 8760 hours. No components found for: "NTPROBNP" No results for input(s): "PROBNP" in the last 8760 hours. No results for input(s): "TSH" in the last 8760 hours.  Physical Exam:    Today's Vitals   08/27/23 0850  BP: 130/72  Pulse: 86  Resp: 16  SpO2: 96%  Weight: 221 lb 9.6 oz (100.5 kg)  Height: 5\' 3"  (1.6 m)   Body mass index is 39.25 kg/m. Wt Readings from Last 3 Encounters:  08/27/23 221 lb 9.6 oz (100.5 kg)  07/15/23 224 lb (101.6 kg)  07/04/23 215 lb (97.5 kg)    Physical Exam  Constitutional: No distress.  Age appropriate, hemodynamically stable.   Neck: No JVD present.  Cardiovascular: Normal rate, regular rhythm, S1 normal, S2 normal, intact distal pulses and normal pulses. Exam reveals no gallop, no S3 and no S4.  No murmur heard. Pulmonary/Chest: Effort normal and breath sounds normal. No stridor.  She has no wheezes. She has no rales.  Abdominal: Soft. Bowel sounds are normal. She exhibits no  distension. There is no abdominal tenderness.  Musculoskeletal:        General: No edema.     Cervical back: Neck supple.  Neurological: She is alert and oriented to person, place, and time. She has intact cranial nerves (2-12).  Skin: Skin is warm and moist.     Impression & Recommendation(s):  Impression:   ICD-10-CM   1. Dyspnea on exertion  R06.09 CT CORONARY MORPH W/CTA COR W/SCORE W/CA W/CM &/OR WO/CM    metoprolol tartrate (LOPRESSOR) 50 MG tablet    2. PSVT (paroxysmal supraventricular tachycardia) (HCC)  I47.10 CT CORONARY MORPH W/CTA COR W/SCORE W/CA W/CM &/OR WO/CM    3. Benign hypertension  I10     4. Thyroid disease  E07.9     5. Other specified cardiac arrhythmias  I49.8 CT CORONARY MORPH W/CTA COR W/SCORE W/CA W/CM &/OR WO/CM    6. Pre-procedure lab exam  Z01.812 Basic metabolic panel       Recommendation(s):  Dyspnea on exertion Patient is experiencing shortness of breath with effort related activities. No obvious precordial chest pain. Dyspnea on exertion continues despite better blood pressure management. Echocardiogram notes preserved LVEF, grade 1 diastolic dysfunction, estimated RAP 3 mmHg, no significant valvular heart disease. She is concerned that her shortness of breath could be associated with possible CAD.  She has had stress test in the past with the [facility] which have been inconclusive and/or normal. We discussed the role of exercise nuclear stress test versus coronary CTA and patient would like to proceed with the latter. Will check a BMP prior to her coronary CTA. Start Lopressor 50 mg p.o. twice daily with holding parameters a week prior to her coronary CTA to optimize her ventricular rate. Further recommendations to follow  PSVT (paroxysmal supraventricular tachycardia) (HCC) Zio patch does not illustrate any evidence of atrial  fibrillation. She does have a PSVT burden of approximately 2.2% we discussed pharmacological therapy but she would like to hold off on medications for now. Will continue to monitor her symptoms. I did review the rhythm strips on her phone and the underlying rhythm is sinus with frequent PACs. Patient is aware that increased burden of PACs have been associated with A-fib and on the recent echocardiogram her left atrial size is mildly dilated.  Benign hypertension Office blood pressures are well-controlled. Continue current medical therapy. No changes warranted at this time.  Orders Placed:  Orders Placed This Encounter  Procedures   CT CORONARY MORPH W/CTA COR W/SCORE W/CA W/CM &/OR WO/CM    Standing Status:   Future    Standing Expiration Date:   08/26/2024    Order Specific Question:   If indicated for the ordered procedure, I authorize the administration of contrast media per Radiology protocol    Answer:   Yes    Order Specific Question:   Initiate Coronary CTA Adult Protocol    Answer:   Yes    Order Specific Question:   If indicated initiate Post Coronary CTA Hypotension Adult Protocol    Answer:   Yes    Order Specific Question:   Does the patient have a contrast media/X-ray dye allergy?    Answer:   No    Order Specific Question:   Authorization:    Answer:   FFR will be ordered if deemed medically necessary   Basic metabolic panel    As part of medical decision making results of the Zio patch results, echocardiogram results, discussed pharmacological therapy, were reviewed independently at  today's visit.   Final Medication List:    Meds ordered this encounter  Medications   DISCONTD: metoprolol tartrate (LOPRESSOR) 50 MG tablet    Sig: Take 1 tablet (50 mg total) by mouth 2 (two) times daily. Start 1 week prior to CT scan--take first dose on day of CT scan two hours prior to your scan.    Dispense:  28 tablet    Refill:  0   metoprolol tartrate (LOPRESSOR) 50 MG tablet     Sig: Take 1 tablet (50 mg total) by mouth 2 (two) times daily. Start 1 week prior to CT scan--take first dose on day of CT scan two hours prior to your scan. Hold if systolic blood pressure (top number) is less than 100 or heart rate is less than 60.    Dispense:  28 tablet    Refill:  0    Medications Discontinued During This Encounter  Medication Reason   Aspirin-Calcium Carbonate (BAYER WOMENS) [phone] MG TABS    metoprolol tartrate (LOPRESSOR) 50 MG tablet      Current Outpatient Medications:    acetaminophen (TYLENOL) 325 MG tablet, Take 2 tablets (650 mg total) by mouth every 6 (six) hours as needed for mild pain (or Fever >/= 101)., Disp: 60 tablet, Rfl: 0   aspirin EC 81 MG tablet, Take 81 mg by mouth daily. Swallow whole., Disp: , Rfl:    ASTRAGALUS PO, Take 1 capsule by mouth daily as needed (winter cold prevention)., Disp: , Rfl:    Cholecalciferol (VITAMIN D) 50 MCG (2000 UT) tablet, Take 2,000 Units by mouth daily., Disp: , Rfl:    ECHINACEA PO, Take 1 capsule by mouth daily as needed (winter cold prevention)., Disp: , Rfl:    folic acid (FOLVITE) 1 MG tablet, Take 1 mg by mouth daily., Disp: , Rfl:    hydrochlorothiazide (HYDRODIURIL) 25 MG tablet, Take 12.5 mg by mouth every morning., Disp: , Rfl:    losartan (COZAAR) 50 MG tablet, Take 50 mg by mouth daily., Disp: , Rfl:    methotrexate (RHEUMATREX) 2.5 MG tablet, Take 7.5 mg by mouth every Sunday., Disp: , Rfl:    montelukast (SINGULAIR) 10 MG tablet, Take 1 tablet (10 mg total) by mouth at bedtime. (Patient taking differently: Take 10 mg by mouth daily as needed.), Disp: 30 tablet, Rfl: 0   potassium chloride SA (KLOR-CON) 20 MEQ tablet, Take 20 mEq by mouth daily., Disp: , Rfl:    TURMERIC PO, Take 1,000 mg by mouth daily., Disp: , Rfl:    metoprolol tartrate (LOPRESSOR) 50 MG tablet, Take 1 tablet (50 mg total) by mouth 2 (two) times daily. Start 1 week prior to CT scan--take first dose on day of CT scan two hours prior  to your scan. Hold if systolic blood pressure (top number) is less than 100 or heart rate is less than 60., Disp: 28 tablet, Rfl: 0   polyvinyl alcohol (LIQUIFILM TEARS) 1.4 % ophthalmic solution, Place 1 drop into both eyes as needed for dry eyes., Disp: , Rfl:   Consent:      NA  Disposition:   Return in about 6 months (around 02/25/2024) for Follow up, Dyspnea PSVT. or sooner if needed.  Her questions and concerns were addressed to her satisfaction. She voices understanding of the recommendations provided during this encounter.    Signed, Tessa Lerner, DO, [facility]  [facility]  [address] [place], [place] [address] [phone] 08/27/2023 12:20 PM

## 2023-08-27 NOTE — Patient Instructions (Addendum)
Medication Instructions:  Your physician has recommended you make the following change in your medication:   1) START metoprolol tartrate (Lopressor) 50 mg twice daily (start 1 week prior to your CT scan) Hold if systolic blood pressure (top number) is less than 100 or heart rate is less than 60.  *If you need a refill on your cardiac medications before your next appointment, please call your pharmacy*  Lab Work: TODAY: BMP If you have labs (blood work) drawn today and your tests are completely normal, you will receive your results only by: MyChart Message (if you have MyChart) OR A paper copy in the mail If you have any lab test that is abnormal or we need to change your treatment, we will call you to review the results.  Testing/Procedures: Your physician has requested that you have cardiac CT. Cardiac computed tomography (CT) is a painless test that uses an x-ray machine to take clear, detailed pictures of your heart. For further information please visit [email or web address] Please follow instruction sheet as given.    Follow-Up: At [facility], you and your health needs are our priority.  As part of our continuing mission to provide you with exceptional heart care, we have created designated Provider Care Teams.  These Care Teams include your primary Cardiologist (physician) and Advanced Practice Providers (APPs -  Physician Assistants and Nurse Practitioners) who all work together to provide you with the care you need, when you need it.  Your next appointment:   6 month(s)  The format for your next appointment:   In Person  Provider:   Tessa Lerner, DO {  Other Instructions   Your cardiac CT will be scheduled at:   [facility] [address] [place], [place] [address] [phone]  Please arrive at the [facility] and Children's Entrance (Entrance C2) of [facility] 30 minutes prior to test start time. You can use the FREE valet parking offered at  entrance C (encouraged to control the heart rate for the test). Proceed to the [facility] Radiology Department (first floor) to check-in and test prep.     Please follow these instructions carefully (unless otherwise directed):  An IV will be required for this test and Nitroglycerin will be given.   On the Night Before the Test: Be sure to Drink plenty of water. Do not consume any caffeinated/decaffeinated beverages or chocolate 12 hours prior to your test. Do not take any antihistamines 12 hours prior to your test.  On the Day of the Test: Drink plenty of water until 1 hour prior to the test. Do not eat any food 1 hour prior to test. You may take your regular medications prior to the test.  Take metoprolol (Lopressor) 50 mg two hours prior to test. If you take Hydrochlorothiazide, please HOLD on the morning of the test. FEMALES- please wear underwire-free bra if available, avoid dresses & tight clothing      After the Test: Drink plenty of water. After receiving IV contrast, you may experience a mild flushed feeling. This is normal. On occasion, you may experience a mild rash up to 24 hours after the test. This is not dangerous. If this occurs, you can take Benadryl 25 mg and increase your fluid intake. If you experience trouble breathing, this can be serious. If it is severe call 911 IMMEDIATELY. If it is mild, please call our office. If you take any of these medications: Glipizide/Metformin, Avandament, Glucavance, please do not take 48 hours after completing  test unless otherwise instructed.  We will call to schedule your test 2-4 weeks out understanding that some insurance companies will need an authorization prior to the service being performed.   For more information and frequently asked questions, please visit our website : [email or web address]  For non-scheduling related questions, please contact the cardiac imaging nurse navigator should you have any  questions/concerns: Cardiac Imaging Nurse Navigators Direct Office Dial: [phone]   For scheduling needs, including cancellations and rescheduling, please call [place], [phone].

## 2023-08-28 LAB — BASIC METABOLIC PANEL
BUN/Creatinine Ratio: 19 (ref 12–28)
BUN: 15 mg/dL (ref 8–27)
CO2: 25 mmol/L (ref 20–29)
Calcium: 11.3 mg/dL — ABNORMAL HIGH (ref 8.7–10.3)
Chloride: 105 mmol/L (ref 96–106)
Creatinine, Ser: 0.78 mg/dL (ref 0.57–1.00)
Glucose: 118 mg/dL — ABNORMAL HIGH (ref 70–99)
Potassium: 3.5 mmol/L (ref 3.5–5.2)
Sodium: 142 mmol/L (ref 134–144)
eGFR: 80 mL/min/{1.73_m2} (ref >59)

## 2023-09-06 ENCOUNTER — Ambulatory Visit (HOSPITAL_COMMUNITY): Payer: Medicare Other

## 2023-09-17 ENCOUNTER — Telehealth (HOSPITAL_COMMUNITY): Payer: Self-pay | Admitting: *Deleted

## 2023-09-17 NOTE — Telephone Encounter (Signed)
Reaching out to patient to offer assistance regarding upcoming cardiac imaging study; pt verbalizes understanding of appt date/time, parking situation and where to check in, pre-test NPO status and medications ordered, and verified current allergies; name and call back number provided for further questions should they arise  Larey Brick RN Navigator Cardiac Imaging [facility] [phone] office [phone] cell  Patient to take 50mg  metoprolol tartrate two hours prior to her cardiac CT scan.  She is aware to arrive 8 AM.

## 2023-09-18 ENCOUNTER — Ambulatory Visit (HOSPITAL_COMMUNITY)
Admission: RE | Admit: 2023-09-18 | Discharge: 2023-09-18 | Disposition: A | Discharge status: Home / Self Care | Payer: Medicare Other | Source: Ambulatory Visit | Attending: Cardiology | Admitting: Cardiology

## 2023-09-18 DIAGNOSIS — R0609 Other forms of dyspnea: Secondary | ICD-10-CM | POA: Diagnosis not present

## 2023-09-18 DIAGNOSIS — I498 Other specified cardiac arrhythmias: Secondary | ICD-10-CM | POA: Insufficient documentation

## 2023-09-18 DIAGNOSIS — I2510 Atherosclerotic heart disease of native coronary artery without angina pectoris: Secondary | ICD-10-CM

## 2023-09-18 DIAGNOSIS — I471 Supraventricular tachycardia, unspecified: Secondary | ICD-10-CM | POA: Diagnosis not present

## 2023-09-18 MED ORDER — NITROGLYCERIN 0.4 MG SL SUBL
SUBLINGUAL_TABLET | SUBLINGUAL | Status: AC
  Filled 2023-09-18: qty 2

## 2023-09-18 MED ORDER — IOHEXOL 350 MG/ML SOLN
100.0000 mL | Freq: Once | INTRAVENOUS | Status: AC | PRN
Start: 2023-09-18 — End: 2023-09-18
  Administered 2023-09-18: 100 mL via INTRAVENOUS

## 2023-09-18 MED ORDER — NITROGLYCERIN 0.4 MG SL SUBL
0.8000 mg | SUBLINGUAL_TABLET | Freq: Once | SUBLINGUAL | Status: AC
  Administered 2023-09-18: 0.8 mg via SUBLINGUAL

## 2023-09-23 NOTE — Progress Notes (Signed)
Reviewed the results of the coronary CTA with the patient-this was done to evaluate her dyspnea.  She has mild CAC and mild nonobstructive disease -not likely the source of her dyspnea.  She does have a small atrial septal defect with left-to-right shunting and dilatation of the pulmonary artery.  Patient states that her shortness of breath is progressive and would like to have it closed if she is deemed an appropriate candidate.  Please refer her to Dr. Excell Seltzer for evaluation of ASD closure.  Also start her on rosuvastatin 20 mg p.o. nightly with fasting lipids and CMP in 6 weeks to reevaluate lipids and LFTs  External Labs: Collected: September 03, 2023 [facility] Total cholesterol 226, triglycerides 91, HDL 76, LDL calculated 126  Please call the patient when the prescriptions have been sent, remind her of the 6-week follow-up labs, after the appointment with Dr. Excell Seltzer has been made.  Kyran Connaughton Lynn, DO, [facility]

## 2023-09-24 ENCOUNTER — Telehealth: Payer: Self-pay

## 2023-09-24 DIAGNOSIS — E7849 Other hyperlipidemia: Secondary | ICD-10-CM

## 2023-09-24 DIAGNOSIS — I720 Aneurysm of carotid artery: Secondary | ICD-10-CM

## 2023-09-24 DIAGNOSIS — Q211 Atrial septal defect, unspecified: Secondary | ICD-10-CM

## 2023-09-24 MED ORDER — ROSUVASTATIN CALCIUM 20 MG PO TABS: 20.0000 mg | ORAL_TABLET | Freq: Every day | ORAL | 3 refills | Status: AC

## 2023-09-24 NOTE — Telephone Encounter (Signed)
Per Dr. Odis Hollingshead, scheduled the patient for ASD consult with Dr. Excell Seltzer on 10/04/2023. She was grateful for call and agreed with plan.

## 2023-09-24 NOTE — Telephone Encounter (Signed)
Spoke with the patient and advised that prescription has been sent in. Referral has been placed. Orders for labs are in and patient is aware to have these done in about 6 weeks.

## 2023-09-24 NOTE — Telephone Encounter (Signed)
-----   Message from [facility] sent at 09/23/2023  6:42 PM EST ----- Reviewed the results of the coronary CTA with the patient-this was done to evaluate her dyspnea.  She has mild CAC and mild nonobstructive disease -not likely the source of her dyspnea.  She does have a small atrial septal defect with left-to-right shunting and dilatation of the pulmonary artery.  Patient states that her shortness of breath is progressive and would like to have it closed if she is deemed an appropriate candidate.  Please refer her to Dr. Excell Seltzer for evaluation of ASD closure.  Also start her on rosuvastatin 20 mg p.o. nightly with fasting lipids and CMP in 6 weeks to reevaluate lipids and LFTs  External Labs: Collected: September 03, 2023 [facility] Total cholesterol 226, triglycerides 91, HDL 76, LDL calculated 126  Please call the patient when the prescriptions have been sent, remind her of the 6-week follow-up labs, after the appointment with Dr. Excell Seltzer has been made.  Sunit Sulphur, DO, [facility]

## 2023-10-04 ENCOUNTER — Encounter: Payer: Self-pay | Admitting: Cardiovascular Disease

## 2023-10-04 ENCOUNTER — Ambulatory Visit: Payer: Medicare Other | Attending: Cardiovascular Disease | Admitting: Cardiovascular Disease

## 2023-10-04 VITALS — BP 132/82 | HR 84 | Resp 16 | Ht 63.0 in | Wt 224.4 lb

## 2023-10-04 DIAGNOSIS — Q211 Atrial septal defect, unspecified: Secondary | ICD-10-CM

## 2023-10-04 DIAGNOSIS — Z01812 Encounter for preprocedural laboratory examination: Secondary | ICD-10-CM

## 2023-10-04 NOTE — Patient Instructions (Signed)
Lab Work: CBC, CMET today If you have labs (blood work) drawn today and your tests are completely normal, you will receive your results only by: MyChart Message (if you have MyChart) OR A paper copy in the mail If you have any lab test that is abnormal or we need to change your treatment, we will call you to review the results.  Testing/Procedures: Transesophageal Echocardiogram Your physician has requested that you have a TEE. During a TEE, sound waves are used to create images of your heart. It provides your doctor with information about the size and shape of your heart and how well your heart's chambers and valves are working. In this test, a transducer is attached to the end of a flexible tube that's guided down your throat and into your esophagus (the tube leading from you mouth to your stomach) to get a more detailed image of your heart. You are not awake for the procedure. Please see the instruction sheet given to you today. For further information please visit [email or web address]  Follow-Up: At [facility], you and your health needs are our priority.  As part of our continuing mission to provide you with exceptional heart care, we have created designated Provider Care Teams.  These Care Teams include your primary Cardiologist (physician) and Advanced Practice Providers (APPs -  Physician Assistants and Nurse Practitioners) who all work together to provide you with the care you need, when you need it.  Your next appointment:   Structural Team will follow-up  Provider:   Tonny Bollman, MD     Other Instructions    You are scheduled for a TEE (Transesophageal Echocardiogram) on Wednesday, December 11 with Dr. Odis Hollingshead.  Please arrive at the [facility] (Main Entrance A) at [facility]: [address] [place], [place] [address] at 7:30 AM (This time is one hour(s) before your procedure to ensure your preparation).   Free valet parking service is available. You will  check in at ADMITTING.   *Please Note: You will receive a call the day before your procedure to confirm the appointment time. That time may have changed from the original time based on the schedule for that day.*   DIET:  Nothing to eat or drink after midnight except a sip of water with medications (see medication instructions below)  MEDICATION INSTRUCTIONS: !!IF ANY NEW MEDICATIONS ARE STARTED AFTER TODAY, PLEASE NOTIFY YOUR PROVIDER AS SOON AS POSSIBLE!!  FYI: Medications such as Semaglutide (Ozempic, [place]), Tirzepatide (Mounjaro, Zepbound), Dulaglutide (Trulicity), etc ("GLP1 agonists") AND Canagliflozin (Invokana), Dapagliflozin (Farxiga), Empagliflozin (Jardiance), Ertugliflozin (Steglatro), Bexagliflozin [employer]) or any combination with one of these drugs such as Invokamet (Canagliflozin/Metformin), Synjardy (Empagliflozin/Metformin), etc ("SGLT2 inhibitors") must be held around the time of a procedure. This is not a comprehensive list of all of these drugs. Please review all of your medications and talk to your provider if you take any one of these. If you are not sure, ask your provider.   DO NOT TAKE Hydrochlorothiazide the morning of procedure  LABS: Today  FYI:  For your safety, and to allow [place] to monitor your vital signs accurately during the surgery/procedure we request: If you have artificial nails, gel coating, SNS etc, please have those removed prior to your surgery/procedure. Not having the nail coverings /polish removed may result in cancellation or delay of your surgery/procedure.  Your support person will be asked to wait in the waiting room during your procedure.  It is OK to have someone drop you off and  come back when you are ready to be discharged.  You cannot drive after the procedure and will need someone to drive you home.  Bring your insurance cards.  *Special Note: Every effort is made to have your procedure done on time. Occasionally there are emergencies  that occur at the hospital that may cause delays. Please be patient if a delay does occur.

## 2023-10-04 NOTE — Progress Notes (Signed)
Cardiology Office Note:    Date:  10/04/2023   ID:  Joanna Lee, DOB 06-24-1951, MRN [id number]  PCP:  Geoffry Paradise, MD   [facility] Providers Cardiologist:  Tessa Lerner, DO     Referring MD: Geoffry Paradise, MD   Chief Complaint  Patient presents with   Atrial Septal Defect         History of Present Illness:    Joanna Lee is a 72 y.o. female referred by Dr. Odis Hollingshead for evaluation of atrial septal defect.  The patient is here alone today.  She does not recall any history of heart murmur.  She has no congenital heart disease in her family.  She developed heart palpitations and underwent evaluation that included a coronary CTA.  This incidentally found an atrial septal defect.  She did not have any high-grade obstructive CAD.  She complains of shortness of breath with activity but states this is longstanding and she is not concerned about it.  She denies any recent chest pain, chest pressure, orthopnea, or PND.  No lightheadedness or syncope.   Current Medications: Current Meds  Medication Sig   aspirin EC 81 MG tablet Take 81 mg by mouth daily. Swallow whole.   ASTRAGALUS PO Take 1 capsule by mouth daily as needed (winter cold prevention).   Cholecalciferol (VITAMIN D) 50 MCG (2000 UT) tablet Take 2,000 Units by mouth daily.   ECHINACEA PO Take 1 capsule by mouth daily as needed (winter cold prevention).   folic acid (FOLVITE) 1 MG tablet Take 1 mg by mouth daily.   hydrochlorothiazide (HYDRODIURIL) 25 MG tablet Take 12.5 mg by mouth every morning.   losartan (COZAAR) 50 MG tablet Take 50 mg by mouth daily.   methotrexate (RHEUMATREX) 2.5 MG tablet Take 7.5 mg by mouth every Sunday.   montelukast (SINGULAIR) 10 MG tablet Take 1 tablet (10 mg total) by mouth at bedtime. (Patient taking differently: Take 10 mg by mouth daily as needed.)   polyvinyl alcohol (LIQUIFILM TEARS) 1.4 % ophthalmic solution Place 1 drop into both eyes as needed for dry eyes.   potassium  chloride SA (KLOR-CON) 20 MEQ tablet Take 20 mEq by mouth daily.   rosuvastatin (CRESTOR) 20 MG tablet Take 1 tablet (20 mg total) by mouth daily.   TURMERIC PO Take 1,000 mg by mouth daily.     Allergies:   Codeine   ROS:   Please see the history of present illness.    All other systems reviewed and are negative.  EKGs/Labs/Other Studies Reviewed:    The following studies were reviewed today: Cardiac Studies & Procedures       ECHOCARDIOGRAM  ECHOCARDIOGRAM COMPLETE 08/23/2023  Narrative ECHOCARDIOGRAM REPORT    Patient Name:   Joanna Lee Date of Exam: 08/23/2023 Medical Rec #:  [id number]     Height:       63.0 in Accession #:    [id number]    Weight:       224.0 lb Date of Birth:  01/01/1951      BSA:          2.029 m Patient Age:    72 years      BP:           144/69 mmHg Patient Gender: F             HR:           77  bpm. Exam Location:  [address]  Procedure: 2D Echo, Cardiac  Doppler, Color Doppler and Intracardiac Opacification Agent  Indications:     R00.2 Palpitations; I10 Hypertension  History:         Patient has no prior history of Echocardiogram examinations. Signs/Symptoms:Dyspnea. Dizziness. Carotid stenosis.  Sonographer:     Cathie Beams RCS Referring Phys:  [id number] Tessa Lerner Diagnosing Phys: Thurmon Fair MD  IMPRESSIONS   1. Left ventricular ejection fraction, by estimation, is 55 to 60%. The left ventricle has normal function. The left ventricle has no regional wall motion abnormalities. Left ventricular diastolic parameters are consistent with Grade I diastolic dysfunction (impaired relaxation). Elevated left atrial pressure. The E/e' is 16. 2. Right ventricular systolic function is normal. The right ventricular size is normal. 3. Left atrial size was mildly dilated. 4. The mitral valve is normal in structure. No evidence of mitral valve regurgitation. No evidence of mitral stenosis. 5. The aortic valve is tricuspid. Aortic valve  regurgitation is not visualized. No aortic stenosis is present. 6. The inferior vena cava is normal in size with greater than 50% respiratory variability, suggesting right atrial pressure of 3 mmHg.  FINDINGS Left Ventricle: Left ventricular ejection fraction, by estimation, is 55 to 60%. The left ventricle has normal function. The left ventricle has no regional wall motion abnormalities. The left ventricular internal cavity size was normal in size. There is no left ventricular hypertrophy. Left ventricular diastolic parameters are consistent with Grade I diastolic dysfunction (impaired relaxation). Elevated left atrial pressure. The E/e' is 16.  Right Ventricle: The right ventricular size is normal. No increase in right ventricular wall thickness. Right ventricular systolic function is normal.  Left Atrium: Left atrial size was mildly dilated.  Right Atrium: Right atrial size was normal in size.  Pericardium: There is no evidence of pericardial effusion.  Mitral Valve: The mitral valve is normal in structure. No evidence of mitral valve regurgitation. No evidence of mitral valve stenosis.  Tricuspid Valve: The tricuspid valve is normal in structure. Tricuspid valve regurgitation is not demonstrated. No evidence of tricuspid stenosis.  Aortic Valve: The aortic valve is tricuspid. Aortic valve regurgitation is not visualized. No aortic stenosis is present.  Pulmonic Valve: The pulmonic valve was normal in structure. Pulmonic valve regurgitation is mild. No evidence of pulmonic stenosis.  Aorta: The aortic root is normal in size and structure.  Venous: The inferior vena cava is normal in size with greater than 50% respiratory variability, suggesting right atrial pressure of 3 mmHg.  IAS/Shunts: No atrial level shunt detected by color flow Doppler.   LEFT VENTRICLE PLAX 2D LVIDd:         4.90 cm   Diastology LVIDs:         3.30 cm   LV e' medial:    4.79 cm/s LV PW:         1.20 cm    LV E/e' medial:  16.4 LV IVS:        1.10 cm   LV e' lateral:   7.29 cm/s LVOT diam:     2.00 cm   LV E/e' lateral: 10.8 LV SV:         61 LV SV Index:   30 LVOT Area:     3.14 cm   RIGHT VENTRICLE RV S prime:     16.50 cm/s TAPSE (M-mode): 2.4 cm  LEFT ATRIUM             Index LA diam:        4.50 cm 2.22 cm/m LA Vol (  A2C):   39.6 ml 19.52 ml/m LA Vol (A4C):   44.1 ml 21.73 ml/m LA Biplane Vol: 43.4 ml 21.39 ml/m AORTIC VALVE LVOT Vmax:   88.60 cm/s LVOT Vmean:  55.700 cm/s LVOT VTI:    0.193 m  AORTA Ao Root diam: 2.90 cm Ao Asc diam:  3.50 cm  MITRAL VALVE MV Area (PHT): 3.20 cm    SHUNTS MV Decel Time: 237 msec    Systemic VTI:  0.19 m MV E velocity: 78.60 cm/s  Systemic Diam: 2.00 cm MV A velocity: 99.60 cm/s MV E/A ratio:  0.79  Mihai Croitoru MD Electronically signed by Thurmon Fair MD Signature Date/Time: 08/23/2023/4:33:46 PM    Final (Updated)    MONITORS  LONG TERM MONITOR (3-14 DAYS) 07/29/2023  Narrative Cardiac monitor (Zio Patch): July 15, 2023 -July 22, 2023 Dominant rhythm sinus. Heart rate 45-179 bpm.  Avg HR 68 bpm. No atrial fibrillation detected during the monitoring period. No ventricular tachycardia, high grade AV block, pauses (3 seconds or longer). Paroxysmal episodes of supraventricular tachycardia, asymptomatic, total supraventricular ectopic burden 2.2% (predominantly isolated beats). Total ventricular ectopic burden <1%. Patient triggered events: 0.   CT SCANS  CT CORONARY MORPH W/CTA COR W/SCORE 09/18/2023  Narrative HISTORY: Chest pain/anginal equiv, intermediate CAD risk, not treadmill candidate  EXAM: Cardiac/Coronary  CT  TECHNIQUE: The patient was scanned on a [employer].  PROTOCOL: A 120 kV prospective scan was triggered in the descending thoracic aorta at 111 HU's. Axial non-contrast 3 mm slices were carried out through the heart. The data set was analyzed on a dedicated work station  and scored using the Agatston method. Gantry rotation speed was 250 msecs and collimation was .6 mm. Beta blockade and 0.8 mg of sl NTG was given. The 3D data set was reconstructed in 5% intervals of the 35-75 % of the R-R cycle. Systolic and diastolic phases were analyzed on a dedicated work station using MPR, MIP and VRT modes. The patient received contrast: OMNIPAQUE IOHEXOL 350 MG/ML SOLN.  FINDINGS: Image quality: Average  Noise artifact is: Slab artifact  Coronary calcium score is 28, which places the patient in the 58th percentile for age and sex matched control.  Coronary arteries: Normal coronary origins.  Right dominance.  Right Coronary Artery: Minimal plaque in the proximal and mid RCA, <25% stenosis.  Left Main Coronary Artery: Minimal mixed plaque proximal LM, <25% stenosis.  Left Anterior Descending Coronary Artery: Minimal plaque in the proximal LAD, <25% stenosis. Mild plaque in the mid LAD 25-49% stenosis.  Left Circumflex Artery: Minimal mixed atherosclerotic plaque in the proximal LCx, <25% stenosis.  Aorta: Normal size, 33 mm at the mid ascending aorta (level of the PA bifurcation) measured double oblique.  Aortic Valve: No calcifications.  Tricuspid aortic valve.  Other findings:  Normal pulmonary vein drainage into the left atrium.  Normal left atrial appendage without thrombus.  Mild dilation of main pulmonary artery, 30 mm, may indicate increased pulmonary pressures.  Small atrial septal defect with left to right shunt.  Please see separate report from [facility] for non-cardiac findings.  IMPRESSION: 1. Mild CAD in mid LAD, 25-49% stenosis, CADRADS 2.  2. Total plaque volume 114 mm3 which is 31st percentile for age- and sex-matched controls (calcified plaque 8 mm3; non-calcified plaque 106 mm3). TPV is moderate.  3. Coronary calcium score is 28, which places the patient in the 58th percentile for age and sex matched  control.  4. Normal coronary origins with right  dominance.  5. Small atrial septal defect with left to right shunt.  6. Mild dilation of main pulmonary artery, 30 mm, may indicate increased pulmonary pressures.  RECOMMENDATIONS: CAD-RADS 2. Mild non-obstructive CAD (25-49%). Consider non-atherosclerotic causes of chest pain. Consider preventive therapy and risk factor modification.   Electronically Signed By: Weston Brass M.D. On: 09/18/2023 11:01          EKG:        Recent Labs: 08/27/2023: BUN 15; Creatinine, Ser 0.78; Potassium 3.5; Sodium 142  Recent Lipid Panel    Component Value Date/Time   CHOL (H) 03/18/2008 1805    205        ATP III CLASSIFICATION:  <200     mg/dL   Desirable  595-638  mg/dL   Borderline High  >=756    mg/dL   High   TRIG 82 43/32/9518 1805   HDL 61 03/18/2008 1805   CHOLHDL 3.4 03/18/2008 1805   VLDL 16 03/18/2008 1805   LDLCALC (H) 03/18/2008 1805    128        Total Cholesterol/HDL:CHD Risk Coronary Heart Disease Risk Table                     Men   Women  1/2 Average Risk   3.4   3.3     Risk Assessment/Calculations:                Physical Exam:    VS:  BP 132/82 (BP Location: Left Arm, Patient Position: Sitting, Cuff Size: Large)   Pulse 84   Resp 16   Ht 5\' 3"  (1.6 m)   Wt 224 lb 6.4 oz (101.8 kg)   SpO2 93%   BMI 39.75 kg/m     Wt Readings from Last 3 Encounters:  10/04/23 224 lb 6.4 oz (101.8 kg)  08/27/23 221 lb 9.6 oz (100.5 kg)  07/15/23 224 lb (101.6 kg)     GEN:  Well nourished, well developed in no acute distress HEENT: Normal NECK: No JVD; No carotid bruits LYMPHATICS: No lymphadenopathy CARDIAC: RRR, no murmurs, rubs, gallops RESPIRATORY:  Clear to auscultation without rales, wheezing or rhonchi  ABDOMEN: Soft, non-tender, non-distended MUSCULOSKELETAL:  No edema; No deformity  SKIN: Warm and dry NEUROLOGIC:  Alert and oriented x 3 PSYCHIATRIC:  Normal affect   Assessment & Plan ASD  (atrial septal defect) The patient appears to have a small secundum ASD.  I reviewed her coronary CTA images today.  She does not appear to have any significant RV or RA enlargement either on her CTA or her echo study.  She did have some PA enlargement seen but her echo showed no evidence of pulmonary hypertension.  Discussed possible treatment options with the patient.  We talked about reassurance versus further testing with a transesophageal echo to better understand the size of her ASD and interatrial septal anatomy.  She would like to proceed with this.  I reviewed the risks, indications, and alternatives to TEE with the patient.  She understands the risks of anesthesia reaction, esophageal injury, or oropharyngeal injury are very low.  She would like to proceed at the next available time.  We discussed pathophysiology of ASD today, concerns about volume overload in the right heart, and potential for paradoxical embolus.  At 39 with normal RA and RV size, I am hopeful that this will be a small atrial septal defect appropriate for continued observation. Pre-procedure lab exam Pre-TEE labs ordered.  Informed Consent   Shared Decision Making/Informed Consent   The risks [esophageal damage, perforation (1:10,000 risk), bleeding, pharyngeal hematoma as well as other potential complications associated with conscious sedation including aspiration, arrhythmia, respiratory failure and death], benefits (treatment guidance and diagnostic support) and alternatives of a transesophageal echocardiogram were discussed in detail with Joanna Lee and she is willing to proceed.        Medication Adjustments/Labs and Tests Ordered: Current medicines are reviewed at length with the patient today.  Concerns regarding medicines are outlined above.  Orders Placed This Encounter  Procedures   CBC   Basic metabolic panel   No orders of the defined types were placed in this encounter.   Patient Instructions   Lab Work: CBC, CMET today If you have labs (blood work) drawn today and your tests are completely normal, you will receive your results only by: MyChart Message (if you have MyChart) OR A paper copy in the mail If you have any lab test that is abnormal or we need to change your treatment, we will call you to review the results.  Testing/Procedures: Transesophageal Echocardiogram Your physician has requested that you have a TEE. During a TEE, sound waves are used to create images of your heart. It provides your doctor with information about the size and shape of your heart and how well your heart's chambers and valves are working. In this test, a transducer is attached to the end of a flexible tube that's guided down your throat and into your esophagus (the tube leading from you mouth to your stomach) to get a more detailed image of your heart. You are not awake for the procedure. Please see the instruction sheet given to you today. For further information please visit [email or web address]  Follow-Up: At [facility], you and your health needs are our priority.  As part of our continuing mission to provide you with exceptional heart care, we have created designated Provider Care Teams.  These Care Teams include your primary Cardiologist (physician) and Advanced Practice Providers (APPs -  Physician Assistants and Nurse Practitioners) who all work together to provide you with the care you need, when you need it.  Your next appointment:   Structural Team will follow-up  Provider:   Tonny Bollman, MD     Other Instructions    You are scheduled for a TEE (Transesophageal Echocardiogram) on Wednesday, December 11 with Dr. Odis Hollingshead.  Please arrive at the [facility] (Main Entrance A) at [facility]: [address] [place], [place] [address] at 7:30 AM (This time is one hour(s) before your procedure to ensure your preparation).   Free valet parking service is available. You will  check in at ADMITTING.   *Please Note: You will receive a call the day before your procedure to confirm the appointment time. That time may have changed from the original time based on the schedule for that day.*   DIET:  Nothing to eat or drink after midnight except a sip of water with medications (see medication instructions below)  MEDICATION INSTRUCTIONS: !!IF ANY NEW MEDICATIONS ARE STARTED AFTER TODAY, PLEASE NOTIFY YOUR PROVIDER AS SOON AS POSSIBLE!!  FYI: Medications such as Semaglutide (Ozempic, [place]), Tirzepatide (Mounjaro, Zepbound), Dulaglutide (Trulicity), etc ("GLP1 agonists") AND Canagliflozin (Invokana), Dapagliflozin (Farxiga), Empagliflozin (Jardiance), Ertugliflozin (Steglatro), Bexagliflozin [employer]) or any combination with one of these drugs such as Invokamet (Canagliflozin/Metformin), Synjardy (Empagliflozin/Metformin), etc ("SGLT2 inhibitors") must be held around the time of a procedure. This is not a comprehensive list  of all of these drugs. Please review all of your medications and talk to your provider if you take any one of these. If you are not sure, ask your provider.   DO NOT TAKE Hydrochlorothiazide the morning of procedure  LABS: Today  FYI:  For your safety, and to allow [place] to monitor your vital signs accurately during the surgery/procedure we request: If you have artificial nails, gel coating, SNS etc, please have those removed prior to your surgery/procedure. Not having the nail coverings /polish removed may result in cancellation or delay of your surgery/procedure.  Your support person will be asked to wait in the waiting room during your procedure.  It is OK to have someone drop you off and come back when you are ready to be discharged.  You cannot drive after the procedure and will need someone to drive you home.  Bring your insurance cards.  *Special Note: Every effort is made to have your procedure done on time. Occasionally there are emergencies  that occur at the hospital that may cause delays. Please be patient if a delay does occur.       Signed, Tonny Bollman, MD  10/04/2023 1:26 PM    [facility]

## 2023-10-05 LAB — BASIC METABOLIC PANEL
BUN/Creatinine Ratio: 11 — ABNORMAL LOW (ref 12–28)
BUN: 9 mg/dL (ref 8–27)
CO2: 28 mmol/L (ref 20–29)
Calcium: 11 mg/dL — ABNORMAL HIGH (ref 8.7–10.3)
Chloride: 100 mmol/L (ref 96–106)
Creatinine, Ser: 0.79 mg/dL (ref 0.57–1.00)
Glucose: 121 mg/dL — ABNORMAL HIGH (ref 70–99)
Potassium: 4.1 mmol/L (ref 3.5–5.2)
Sodium: 139 mmol/L (ref 134–144)
eGFR: 79 mL/min/{1.73_m2} (ref >59)

## 2023-10-05 LAB — CBC
Hematocrit: 40.6 % (ref 34.0–46.6)
Hemoglobin: 13.2 g/dL (ref 11.1–15.9)
MCH: 30.6 pg (ref 26.6–33.0)
MCHC: 32.5 g/dL (ref 31.5–35.7)
MCV: 94 fL (ref 79–97)
Platelets: 285 10*3/uL (ref 150–450)
RBC: 4.31 x10E6/uL (ref 3.77–5.28)
RDW: 12.6 % (ref 11.7–15.4)
WBC: 8.1 10*3/uL (ref 3.4–10.8)

## 2023-10-28 ENCOUNTER — Telehealth: Payer: Self-pay | Admitting: Cardiology

## 2023-10-28 NOTE — Telephone Encounter (Signed)
Spoke with pt over the phone and she stated she has not been feeling well and wanted to reschedule TEE appt that was originally on 10/30/23 at 0830. Appt has now been moved to 12/04/23 at 0830 and is still assigned with Dr. Odis Hollingshead. Pt is aware and a message has been sent to the pre-cert/auth pool d/t pt being concerned about her Medicare not covering the procedure since she more than likely will not have [employer] after the beginning of the year.

## 2023-10-28 NOTE — Telephone Encounter (Signed)
Patient is calling to cancel her TRANSESOPHAGEAL ECHOCARDIOGRAM on 10/30/23 and wants to reschedule

## 2023-11-06 DIAGNOSIS — R112 Nausea with vomiting, unspecified: Secondary | ICD-10-CM | POA: Diagnosis not present

## 2023-11-06 DIAGNOSIS — I2602 Saddle embolus of pulmonary artery with acute cor pulmonale: Secondary | ICD-10-CM | POA: Diagnosis not present

## 2023-11-06 DIAGNOSIS — Z91018 Allergy to other foods: Secondary | ICD-10-CM | POA: Diagnosis not present

## 2023-11-06 DIAGNOSIS — J9601 Acute respiratory failure with hypoxia: Secondary | ICD-10-CM | POA: Diagnosis not present

## 2023-11-06 DIAGNOSIS — Z888 Allergy status to other drugs, medicaments and biological substances status: Secondary | ICD-10-CM | POA: Diagnosis not present

## 2023-11-06 DIAGNOSIS — I10 Essential (primary) hypertension: Secondary | ICD-10-CM | POA: Diagnosis not present

## 2023-11-06 DIAGNOSIS — D649 Anemia, unspecified: Secondary | ICD-10-CM | POA: Diagnosis not present

## 2023-11-06 DIAGNOSIS — M1990 Unspecified osteoarthritis, unspecified site: Secondary | ICD-10-CM | POA: Diagnosis not present

## 2023-11-06 DIAGNOSIS — I82412 Acute embolism and thrombosis of left femoral vein: Secondary | ICD-10-CM | POA: Diagnosis not present

## 2023-11-06 DIAGNOSIS — Z8739 Personal history of other diseases of the musculoskeletal system and connective tissue: Secondary | ICD-10-CM | POA: Diagnosis not present

## 2023-11-06 DIAGNOSIS — J441 Chronic obstructive pulmonary disease with (acute) exacerbation: Secondary | ICD-10-CM | POA: Diagnosis not present

## 2023-11-06 DIAGNOSIS — Z79899 Other long term (current) drug therapy: Secondary | ICD-10-CM | POA: Diagnosis not present

## 2023-11-06 DIAGNOSIS — I2692 Saddle embolus of pulmonary artery without acute cor pulmonale: Secondary | ICD-10-CM | POA: Diagnosis not present

## 2023-11-06 DIAGNOSIS — Z6835 Body mass index (BMI) 35.0-35.9, adult: Secondary | ICD-10-CM | POA: Diagnosis not present

## 2023-11-06 DIAGNOSIS — Z733 Stress, not elsewhere classified: Secondary | ICD-10-CM | POA: Diagnosis not present

## 2023-11-06 DIAGNOSIS — J9811 Atelectasis: Secondary | ICD-10-CM | POA: Diagnosis not present

## 2023-11-06 DIAGNOSIS — E669 Obesity, unspecified: Secondary | ICD-10-CM | POA: Diagnosis not present

## 2023-11-06 DIAGNOSIS — Z7982 Long term (current) use of aspirin: Secondary | ICD-10-CM | POA: Diagnosis not present

## 2023-11-06 DIAGNOSIS — Z981 Arthrodesis status: Secondary | ICD-10-CM | POA: Diagnosis not present

## 2023-11-06 DIAGNOSIS — G4730 Sleep apnea, unspecified: Secondary | ICD-10-CM | POA: Diagnosis not present

## 2023-11-06 DIAGNOSIS — Z86718 Personal history of other venous thrombosis and embolism: Secondary | ICD-10-CM | POA: Diagnosis not present

## 2023-11-06 DIAGNOSIS — E876 Hypokalemia: Secondary | ICD-10-CM | POA: Diagnosis not present

## 2023-11-06 DIAGNOSIS — I82403 Acute embolism and thrombosis of unspecified deep veins of lower extremity, bilateral: Secondary | ICD-10-CM | POA: Diagnosis not present

## 2023-11-06 DIAGNOSIS — M069 Rheumatoid arthritis, unspecified: Secondary | ICD-10-CM | POA: Diagnosis not present

## 2023-11-06 DIAGNOSIS — Z884 Allergy status to anesthetic agent status: Secondary | ICD-10-CM | POA: Diagnosis not present

## 2023-11-06 DIAGNOSIS — M7989 Other specified soft tissue disorders: Secondary | ICD-10-CM | POA: Diagnosis not present

## 2023-11-06 DIAGNOSIS — Q211 Atrial septal defect, unspecified: Secondary | ICD-10-CM | POA: Diagnosis not present

## 2023-11-07 ENCOUNTER — Telehealth: Payer: Self-pay | Admitting: Cardiology

## 2023-11-07 DIAGNOSIS — I2602 Saddle embolus of pulmonary artery with acute cor pulmonale: Secondary | ICD-10-CM | POA: Diagnosis not present

## 2023-11-07 DIAGNOSIS — I2692 Saddle embolus of pulmonary artery without acute cor pulmonale: Secondary | ICD-10-CM | POA: Diagnosis not present

## 2023-11-07 DIAGNOSIS — I82412 Acute embolism and thrombosis of left femoral vein: Secondary | ICD-10-CM | POA: Diagnosis not present

## 2023-11-07 NOTE — Telephone Encounter (Signed)
Spoke with pt over the phone and she wanted our office to know that she is currently admitted on a critical care unit at [facility] d/t being dx with a saddle PE and other blood clots in her LE. Pt told that her message would be forwarded to Dr. Odis Hollingshead and Dr. Excell Seltzer and to call us with any other concerns or questions. Pt verbalized understanding.

## 2023-11-07 NOTE — Telephone Encounter (Signed)
Per After Hours Answering Service, Patient went to [facility] ED yesterday and has a blood clot. She would like to speak with Dr. Odis Hollingshead or his nurse. Please advise.

## 2023-11-09 DIAGNOSIS — G4730 Sleep apnea, unspecified: Secondary | ICD-10-CM | POA: Diagnosis not present

## 2023-11-09 DIAGNOSIS — I82412 Acute embolism and thrombosis of left femoral vein: Secondary | ICD-10-CM | POA: Diagnosis not present

## 2023-11-09 DIAGNOSIS — R112 Nausea with vomiting, unspecified: Secondary | ICD-10-CM | POA: Diagnosis not present

## 2023-11-09 DIAGNOSIS — M069 Rheumatoid arthritis, unspecified: Secondary | ICD-10-CM | POA: Diagnosis not present

## 2023-11-09 DIAGNOSIS — I2692 Saddle embolus of pulmonary artery without acute cor pulmonale: Secondary | ICD-10-CM | POA: Diagnosis not present

## 2023-11-09 DIAGNOSIS — D649 Anemia, unspecified: Secondary | ICD-10-CM | POA: Diagnosis not present

## 2023-11-15 ENCOUNTER — Telehealth: Payer: Self-pay

## 2023-11-15 DIAGNOSIS — Q211 Atrial septal defect, unspecified: Secondary | ICD-10-CM

## 2023-11-15 NOTE — Telephone Encounter (Signed)
Spoke with pt regarding Dr. Emelda Brothers recommendations of needing to rescheduling TEE to at least 1 month out from recent hospitalization d/t PE. Explained to pt that TEE has been rescheduled to 12/18/23 at 9 AM (pt will need to be there at 8 AM) and that she will need a new H&P appt which has been scheduled for 12/05/23 with Robin Searing, NP at 1:55 PM. Pt told a new set of instructions would be mailed to her to the address on file for her TEE procedure. Pt verbalized understanding and had no further questions at this time.

## 2023-11-27 DIAGNOSIS — I10 Essential (primary) hypertension: Secondary | ICD-10-CM | POA: Diagnosis not present

## 2023-11-27 DIAGNOSIS — R0609 Other forms of dyspnea: Secondary | ICD-10-CM | POA: Diagnosis not present

## 2023-11-27 DIAGNOSIS — R600 Localized edema: Secondary | ICD-10-CM | POA: Diagnosis not present

## 2023-11-27 DIAGNOSIS — G4733 Obstructive sleep apnea (adult) (pediatric): Secondary | ICD-10-CM | POA: Diagnosis not present

## 2023-11-27 DIAGNOSIS — I2692 Saddle embolus of pulmonary artery without acute cor pulmonale: Secondary | ICD-10-CM | POA: Diagnosis not present

## 2023-11-27 DIAGNOSIS — M069 Rheumatoid arthritis, unspecified: Secondary | ICD-10-CM | POA: Diagnosis not present

## 2023-11-27 DIAGNOSIS — T466X5A Adverse effect of antihyperlipidemic and antiarteriosclerotic drugs, initial encounter: Secondary | ICD-10-CM | POA: Diagnosis not present

## 2023-12-04 NOTE — H&P (View-Only) (Signed)
Cardiology Office Note    Patient Name: Joanna Lee Date of Encounter: 12/04/2023  Primary Care Provider:  Geoffry Paradise, MD Primary Cardiologist:  Tessa Lerner, DO Primary Electrophysiologist: None   Past Medical History    Past Medical History:  Diagnosis Date   Anemia    Arthritis    Carotid artery aneurysm (HCC) 11/20/2003   History of blood transfusion    HTN (hypertension)    Migraines    Osteopenia    PONV (postoperative nausea and vomiting)    also states "SLOW TO WAKE UP"   Pre-diabetes    Rheumatoid arthritis(714.0)    Sleep apnea    hasnt  used CPAP in years   Trichomonas infection    Vertigo    Vitamin D deficiency     History of Present Illness  Joanna Lee is a 73 y.o. female with a PMH of provoked DVT and saddle PE (on Eliquis) 10/2023, mild CAD, left ICA aneurysm s/p coiling, nontoxic multinodular goiter, ASD with left-to-right shunting, HTN, OSA, RA, BPV paroxysmal SVT who presents today for pre-TEE follow-up.  Joanna Lee was seen initially seen by Dr. Odis Hollingshead by referral on 07/15/2023 with complaint of palpitations and lower extremity swelling.  She underwent a 2D echo that showed EF of 55 to 60% with no RWMA and grade 1 DD mildly dilated LA and no significant valve abnormalities.  She completed a 7-day event monitor that revealed PSVT with 2.2% burden and patient was started on low-dose Toprol XL 25 mg. She was seen in follow-up on 10/8 to discuss findings and still endorsed dyspnea on exertion and underwent coronary CTA that showed mild nonobstructive disease with small atrial septal defect with left-to-right shunting and dilation of pulmonary artery.  She was referred to Dr. Excell Seltzer  and was seen on 10/04/2023 for evaluation of ASD closure and he recommended pursuing TEE for further evaluation and patient was scheduled to undergo procedure on 10/30/2023  and was moved to 12/04/2023 but later rescheduled.  She was admitted to the ED at [facility] on  11/06/2023 after evaluation as an outpatient for lower extremity pain and edema.  She was sent to the ED at [facility] and found to have unprovoked saddle PE and left DVT.  She was started on IV heparin and transferred to [facility].  She underwent CT angio that confirmed diagnosis of saddle PE with cor pulmonale.  She was admitted for consideration of EKOS and underwent 2D echo that showed no evidence of right heart strain and decision was made due to no decompensation to continue IV heparin and she was later transition to Eliquis.  She was discharged after 4-day hospital stay.  Joanna Lee presents today for pre-TEE workup.  They were previously diagnosed with a saddle PE and started on Eliquis and a beta blocker. The patient denies any leg pain or tingling, but reports persistent lower extremity swelling.  She reports compliance with her Eliquis and denies any adverse reactions or bleeding.  Her blood pressure today is controlled at 136/82.  Patient denies chest pain, palpitations, dyspnea, PND, orthopnea, nausea, vomiting, dizziness, syncope, edema, weight gain, or early satiety.  Review of Systems  Please see the history of present illness.    All other systems reviewed and are otherwise negative except as noted above.  Physical Exam    Wt Readings from Last 3 Encounters:  10/04/23 224 lb 6.4 oz (101.8 kg)  08/27/23 221 lb 9.6 oz (100.5 kg)  07/15/23  224 lb (101.6 kg)   ZO:XWRUE were no vitals filed for this visit.,There is no height or weight on file to calculate BMI. GEN: Well nourished, well developed in no acute distress Neck: No JVD; No carotid bruits Pulmonary: Clear to auscultation without rales, wheezing or rhonchi  Cardiovascular: Normal rate. Regular rhythm. Normal S1. Normal S2.   Murmurs: There is no murmur.  ABDOMEN: Soft, non-tender, non-distended EXTREMITIES:  No edema; No deformity   EKG/LABS/ Recent Cardiac Studies   ECG personally reviewed by  me today -sinus rhythm with rate of 93 bpm and occasional PVC with no acute changes consistent with previous EKG.  Risk Assessment/Calculations:          Lab Results  Component Value Date   WBC 8.1 10/04/2023   HGB 13.2 10/04/2023   HCT 40.6 10/04/2023   MCV 94 10/04/2023   PLT 285 10/04/2023   Lab Results  Component Value Date   CREATININE 0.79 10/04/2023   BUN 9 10/04/2023   NA 139 10/04/2023   K 4.1 10/04/2023   CL 100 10/04/2023   CO2 28 10/04/2023   Lab Results  Component Value Date   CHOL (H) 03/18/2008    205        ATP III CLASSIFICATION:  <200     mg/dL   Desirable  454-098  mg/dL   Borderline High  >=119    mg/dL   High   HDL 61 14/78/2956   LDLCALC (H) 03/18/2008    128        Total Cholesterol/HDL:CHD Risk Coronary Heart Disease Risk Table                     Men   Women  1/2 Average Risk   3.4   3.3   TRIG 82 03/18/2008   CHOLHDL 3.4 03/18/2008    Lab Results  Component Value Date   HGBA1C 5.9 (H) 07/25/2021   Assessment & Plan    1.  History of DVT and acute PE: -Patient presented to the ED on 11/06/2023 with acute saddle PE without cor pulmonale per 2D echo -She was treated with heparin and discharged with Eliquis. Continue Eliquis 5 mg twice daily -BMET and CBC today  2.  History of atrial septal defect: -ASD discovered on coronary CTA and patient evaluated by Dr. Excell Seltzer with recommendation for TEE -Patient currently sinus rhythm and BMET and CBC to be ordered today.  3.  Essential hypertension: -Patient's blood pressure today was 136/82 Well controlled on Losartan and Hydrochlorothiazide. -Continue Losartan and Hydrochlorothiazide.  4.  PSVT: Identified on event monitor and recent EKG. No significant arrhythmias noted on recent EKG. -No change in management at this time.  Disposition: Follow-up with Tessa Lerner, DO as scheduled Informed Consent   Shared Decision Making/Informed Consent   The risks [esophageal damage, perforation  (1:10,000 risk), bleeding, pharyngeal hematoma as well as other potential complications associated with conscious sedation including aspiration, arrhythmia, respiratory failure and death], benefits (treatment guidance and diagnostic support) and alternatives of a transesophageal echocardiogram were discussed in detail with Joanna Lee and she is willing to proceed.       Signed, Napoleon Form, Leodis Rains, NP 12/04/2023, 9:36 AM [facility]

## 2023-12-04 NOTE — Progress Notes (Signed)
Cardiology Office Note    Patient Name: Joanna Lee Date of Encounter: 12/04/2023  Primary Care Provider:  Geoffry Paradise, MD Primary Cardiologist:  Tessa Lerner, DO Primary Electrophysiologist: None   Past Medical History    Past Medical History:  Diagnosis Date   Anemia    Arthritis    Carotid artery aneurysm (HCC) 11/20/2003   History of blood transfusion    HTN (hypertension)    Migraines    Osteopenia    PONV (postoperative nausea and vomiting)    also states "SLOW TO WAKE UP"   Pre-diabetes    Rheumatoid arthritis(714.0)    Sleep apnea    hasnt  used CPAP in years   Trichomonas infection    Vertigo    Vitamin D deficiency     History of Present Illness  Joanna Lee is a 73 y.o. female with a PMH of provoked DVT and saddle PE (on Eliquis) 10/2023, mild CAD, left ICA aneurysm s/p coiling, nontoxic multinodular goiter, ASD with left-to-right shunting, HTN, OSA, RA, BPV paroxysmal SVT who presents today for pre-TEE follow-up.  Joanna Lee was seen initially seen by Dr. Odis Hollingshead by referral on 07/15/2023 with complaint of palpitations and lower extremity swelling.  She underwent a 2D echo that showed EF of 55 to 60% with no RWMA and grade 1 DD mildly dilated LA and no significant valve abnormalities.  She completed a 7-day event monitor that revealed PSVT with 2.2% burden and patient was started on low-dose Toprol XL 25 mg. She was seen in follow-up on 10/8 to discuss findings and still endorsed dyspnea on exertion and underwent coronary CTA that showed mild nonobstructive disease with small atrial septal defect with left-to-right shunting and dilation of pulmonary artery.  She was referred to Dr. Excell Seltzer  and was seen on 10/04/2023 for evaluation of ASD closure and he recommended pursuing TEE for further evaluation and patient was scheduled to undergo procedure on 10/30/2023  and was moved to 12/04/2023 but later rescheduled.  She was admitted to the ED at [facility] on  11/06/2023 after evaluation as an outpatient for lower extremity pain and edema.  She was sent to the ED at [facility] and found to have unprovoked saddle PE and left DVT.  She was started on IV heparin and transferred to [facility].  She underwent CT angio that confirmed diagnosis of saddle PE with cor pulmonale.  She was admitted for consideration of EKOS and underwent 2D echo that showed no evidence of right heart strain and decision was made due to no decompensation to continue IV heparin and she was later transition to Eliquis.  She was discharged after 4-day hospital stay.  Joanna Lee presents today for pre-TEE workup.  They were previously diagnosed with a saddle PE and started on Eliquis and a beta blocker. The patient denies any leg pain or tingling, but reports persistent lower extremity swelling.  She reports compliance with her Eliquis and denies any adverse reactions or bleeding.  Her blood pressure today is controlled at 136/82.  Patient denies chest pain, palpitations, dyspnea, PND, orthopnea, nausea, vomiting, dizziness, syncope, edema, weight gain, or early satiety.  Review of Systems  Please see the history of present illness.    All other systems reviewed and are otherwise negative except as noted above.  Physical Exam    Wt Readings from Last 3 Encounters:  10/04/23 224 lb 6.4 oz (101.8 kg)  08/27/23 221 lb 9.6 oz (100.5 kg)  07/15/23  224 lb (101.6 kg)   HQ:IONGE were no vitals filed for this visit.,There is no height or weight on file to calculate BMI. GEN: Well nourished, well developed in no acute distress Neck: No JVD; No carotid bruits Pulmonary: Clear to auscultation without rales, wheezing or rhonchi  Cardiovascular: Normal rate. Regular rhythm. Normal S1. Normal S2.   Murmurs: There is no murmur.  ABDOMEN: Soft, non-tender, non-distended EXTREMITIES:  No edema; No deformity   EKG/LABS/ Recent Cardiac Studies   ECG personally reviewed by  me today -sinus rhythm with rate of 93 bpm and occasional PVC with no acute changes consistent with previous EKG.  Risk Assessment/Calculations:          Lab Results  Component Value Date   WBC 8.1 10/04/2023   HGB 13.2 10/04/2023   HCT 40.6 10/04/2023   MCV 94 10/04/2023   PLT 285 10/04/2023   Lab Results  Component Value Date   CREATININE 0.79 10/04/2023   BUN 9 10/04/2023   NA 139 10/04/2023   K 4.1 10/04/2023   CL 100 10/04/2023   CO2 28 10/04/2023   Lab Results  Component Value Date   CHOL (H) 03/18/2008    205        ATP III CLASSIFICATION:  <200     mg/dL   Desirable  952-841  mg/dL   Borderline High  >=324    mg/dL   High   HDL 61 40/08/2724   LDLCALC (H) 03/18/2008    128        Total Cholesterol/HDL:CHD Risk Coronary Heart Disease Risk Table                     Men   Women  1/2 Average Risk   3.4   3.3   TRIG 82 03/18/2008   CHOLHDL 3.4 03/18/2008    Lab Results  Component Value Date   HGBA1C 5.9 (H) 07/25/2021   Assessment & Plan    1.  History of DVT and acute PE: -Patient presented to the ED on 11/06/2023 with acute saddle PE without cor pulmonale per 2D echo -She was treated with heparin and discharged with Eliquis. Continue Eliquis 5 mg twice daily -BMET and CBC today  2.  History of atrial septal defect: -ASD discovered on coronary CTA and patient evaluated by Dr. Excell Seltzer with recommendation for TEE -Patient currently sinus rhythm and BMET and CBC to be ordered today.  3.  Essential hypertension: -Patient's blood pressure today was 136/82 Well controlled on Losartan and Hydrochlorothiazide. -Continue Losartan and Hydrochlorothiazide.  4.  PSVT: Identified on event monitor and recent EKG. No significant arrhythmias noted on recent EKG. -No change in management at this time.  Disposition: Follow-up with Tessa Lerner, DO as scheduled Informed Consent   Shared Decision Making/Informed Consent   The risks [esophageal damage, perforation  (1:10,000 risk), bleeding, pharyngeal hematoma as well as other potential complications associated with conscious sedation including aspiration, arrhythmia, respiratory failure and death], benefits (treatment guidance and diagnostic support) and alternatives of a transesophageal echocardiogram were discussed in detail with Ms. Sassone and she is willing to proceed.       Signed, Napoleon Form, Leodis Rains, NP 12/04/2023, 9:36 AM [facility]

## 2023-12-05 ENCOUNTER — Encounter: Payer: Self-pay | Admitting: Nurse Practitioner

## 2023-12-05 ENCOUNTER — Ambulatory Visit: Payer: Medicare Other | Attending: Nurse Practitioner | Admitting: Nurse Practitioner

## 2023-12-05 VITALS — BP 136/82 | HR 93 | Ht 63.5 in | Wt 217.0 lb

## 2023-12-05 DIAGNOSIS — I824Y9 Acute embolism and thrombosis of unspecified deep veins of unspecified proximal lower extremity: Secondary | ICD-10-CM | POA: Insufficient documentation

## 2023-12-05 DIAGNOSIS — I2692 Saddle embolus of pulmonary artery without acute cor pulmonale: Secondary | ICD-10-CM | POA: Insufficient documentation

## 2023-12-05 DIAGNOSIS — I471 Supraventricular tachycardia, unspecified: Secondary | ICD-10-CM | POA: Insufficient documentation

## 2023-12-05 DIAGNOSIS — Q211 Atrial septal defect, unspecified: Secondary | ICD-10-CM | POA: Insufficient documentation

## 2023-12-05 NOTE — Patient Instructions (Signed)
Medication Instructions:  Your physician recommends that you continue on your current medications as directed. Please refer to the Current Medication list given to you today.  *If you need a refill on your cardiac medications before your next appointment, please call your pharmacy*   Lab Work: BMET, CBC - today   If you have labs (blood work) drawn today and your tests are completely normal, you will receive your results only by: MyChart Message (if you have MyChart) OR A paper copy in the mail If you have any lab test that is abnormal or we need to change your treatment, we will call you to review the results.   Testing/Procedures: Your physician has requested that you have a TEE. During a TEE, sound waves are used to create images of your heart. It provides your doctor with information about the size and shape of your heart and how well your heart's chambers and valves are working. In this test, a transducer is attached to the end of a flexible tube that's guided down your throat and into your esophagus (the tube leading from you mouth to your stomach) to get a more detailed image of your heart. You are not awake for the procedure. Please see the instruction sheet given to you today. For further information please visit [email or web address]     Follow-Up: Follow up as scheduled   Other Instructions Dear Joanna Lee,   You are scheduled for a TEE (Transesophageal Echocardiogram) on Wednesday, January 29 with Dr. Odis Hollingshead.  Please arrive at the [facility] (Main Entrance A) at [facility]: [address] [place], [place] [address] at 8:00 AM (This time is 1 hour(s) before your procedure to ensure your preparation). Your procedure is scheduled to begin at 9 AM.   Free valet parking service is available. You will check in at ADMITTING.    *Please Note: You will receive a call the day before your procedure to confirm the appointment time. That time may have changed from the  original time based on the schedule for that day.*    DIET:  Nothing to eat or drink after midnight except a sip of water with medications (see medication instructions below)   MEDICATION INSTRUCTIONS: !!IF ANY NEW MEDICATIONS ARE STARTED AFTER TODAY, PLEASE NOTIFY YOUR PROVIDER AS SOON AS POSSIBLE!!    *Hold your hydrochlorothiazide the morning of the TEE procedure - this is for a comfort measure for you so that this medication doesn't make you feel the need to urinate during the procedure*   LABS: Come to [facility] on the first floor of the same building as [employer] ([address]). You will need an update BMP and CBC prior to your TEE.    FYI:  For your safety, and to allow [place] to monitor your vital signs accurately during the surgery/procedure we request: If you have artificial nails, gel coating, SNS etc, please have those removed prior to your surgery/procedure. Not having the nail coverings /polish removed may result in cancellation or delay of your surgery/procedure.   Your support person will be asked to wait in the waiting room during your procedure.  It is OK to have someone drop you off and come back when you are ready to be discharged.  You cannot drive after the procedure and will need someone to drive you home.   Bring your insurance cards.   *Special Note: Every effort is made to have your procedure done on time. Occasionally there are emergencies that occur  at the hospital that may cause delays. Please be patient if a delay does occur.

## 2023-12-06 LAB — CBC
Hematocrit: 40.4 % (ref 34.0–46.6)
Hemoglobin: 13.3 g/dL (ref 11.1–15.9)
MCH: 30.9 pg (ref 26.6–33.0)
MCHC: 32.9 g/dL (ref 31.5–35.7)
MCV: 94 fL (ref 79–97)
Platelets: 265 10*3/uL (ref 150–450)
RBC: 4.3 x10E6/uL (ref 3.77–5.28)
RDW: 12.6 % (ref 11.7–15.4)
WBC: 8.1 10*3/uL (ref 3.4–10.8)

## 2023-12-06 LAB — BASIC METABOLIC PANEL
BUN/Creatinine Ratio: 14 (ref 12–28)
BUN: 10 mg/dL (ref 8–27)
CO2: 25 mmol/L (ref 20–29)
Calcium: 11.2 mg/dL — ABNORMAL HIGH (ref 8.7–10.3)
Chloride: 99 mmol/L (ref 96–106)
Creatinine, Ser: 0.74 mg/dL (ref 0.57–1.00)
Glucose: 101 mg/dL — ABNORMAL HIGH (ref 70–99)
Potassium: 3.4 mmol/L — ABNORMAL LOW (ref 3.5–5.2)
Sodium: 140 mmol/L (ref 134–144)
eGFR: 85 mL/min/{1.73_m2} (ref >59)

## 2023-12-09 ENCOUNTER — Other Ambulatory Visit: Payer: Self-pay | Admitting: *Deleted

## 2023-12-09 DIAGNOSIS — E876 Hypokalemia: Secondary | ICD-10-CM

## 2023-12-16 DIAGNOSIS — I82412 Acute embolism and thrombosis of left femoral vein: Secondary | ICD-10-CM | POA: Diagnosis not present

## 2023-12-16 DIAGNOSIS — Q211 Atrial septal defect, unspecified: Secondary | ICD-10-CM | POA: Diagnosis not present

## 2023-12-16 DIAGNOSIS — E876 Hypokalemia: Secondary | ICD-10-CM | POA: Diagnosis not present

## 2023-12-16 DIAGNOSIS — R0609 Other forms of dyspnea: Secondary | ICD-10-CM | POA: Diagnosis not present

## 2023-12-16 DIAGNOSIS — I10 Essential (primary) hypertension: Secondary | ICD-10-CM | POA: Diagnosis not present

## 2023-12-16 DIAGNOSIS — I493 Ventricular premature depolarization: Secondary | ICD-10-CM | POA: Diagnosis not present

## 2023-12-16 DIAGNOSIS — R600 Localized edema: Secondary | ICD-10-CM | POA: Diagnosis not present

## 2023-12-16 DIAGNOSIS — I499 Cardiac arrhythmia, unspecified: Secondary | ICD-10-CM | POA: Diagnosis not present

## 2023-12-16 DIAGNOSIS — E042 Nontoxic multinodular goiter: Secondary | ICD-10-CM | POA: Diagnosis not present

## 2023-12-16 DIAGNOSIS — I2692 Saddle embolus of pulmonary artery without acute cor pulmonale: Secondary | ICD-10-CM | POA: Diagnosis not present

## 2023-12-16 DIAGNOSIS — M069 Rheumatoid arthritis, unspecified: Secondary | ICD-10-CM | POA: Diagnosis not present

## 2023-12-17 NOTE — OR Nursing (Signed)
Called patient with pre-procedure instructions for tomorrow.   Patient informed of:   Time to arrive for procedure. 0715 Remain NPO past midnight.  Must have a ride home and a responsible adult to remain with them for 24 hours post procedure.  Confirmed blood thinner. Confirmed no breaks in taking blood thinner for 3+ weeks prior to procedure. Confirmed patient stopped all GLP-1s and GLP-2s for at least one week before procedure.

## 2023-12-18 ENCOUNTER — Ambulatory Visit (HOSPITAL_COMMUNITY): Payer: Medicare Other

## 2023-12-18 ENCOUNTER — Ambulatory Visit (HOSPITAL_COMMUNITY)
Admission: RE | Admit: 2023-12-18 | Discharge: 2023-12-18 | Disposition: A | Discharge status: Home / Self Care | Payer: Medicare Other | Attending: Cardiology | Admitting: Cardiology

## 2023-12-18 ENCOUNTER — Ambulatory Visit (HOSPITAL_COMMUNITY): Payer: Medicare Other | Admitting: Anesthesiology

## 2023-12-18 ENCOUNTER — Encounter (HOSPITAL_COMMUNITY): Payer: Self-pay | Admitting: Cardiology

## 2023-12-18 ENCOUNTER — Ambulatory Visit (HOSPITAL_BASED_OUTPATIENT_CLINIC_OR_DEPARTMENT_OTHER): Payer: Medicare Other | Admitting: Anesthesiology

## 2023-12-18 ENCOUNTER — Other Ambulatory Visit: Payer: Self-pay

## 2023-12-18 ENCOUNTER — Encounter (HOSPITAL_COMMUNITY)
Admission: RE | Disposition: A | Discharge status: Home / Self Care | Payer: Self-pay | Source: Home / Self Care | Attending: Cardiology

## 2023-12-18 DIAGNOSIS — Z8774 Personal history of (corrected) congenital malformations of heart and circulatory system: Secondary | ICD-10-CM | POA: Diagnosis not present

## 2023-12-18 DIAGNOSIS — Q211 Atrial septal defect, unspecified: Secondary | ICD-10-CM

## 2023-12-18 DIAGNOSIS — Z7901 Long term (current) use of anticoagulants: Secondary | ICD-10-CM | POA: Diagnosis not present

## 2023-12-18 DIAGNOSIS — Z86718 Personal history of other venous thrombosis and embolism: Secondary | ICD-10-CM | POA: Insufficient documentation

## 2023-12-18 DIAGNOSIS — I2510 Atherosclerotic heart disease of native coronary artery without angina pectoris: Secondary | ICD-10-CM | POA: Insufficient documentation

## 2023-12-18 DIAGNOSIS — E669 Obesity, unspecified: Secondary | ICD-10-CM | POA: Diagnosis not present

## 2023-12-18 DIAGNOSIS — I10 Essential (primary) hypertension: Secondary | ICD-10-CM | POA: Insufficient documentation

## 2023-12-18 DIAGNOSIS — G4733 Obstructive sleep apnea (adult) (pediatric): Secondary | ICD-10-CM | POA: Diagnosis not present

## 2023-12-18 DIAGNOSIS — Q2112 Patent foramen ovale: Secondary | ICD-10-CM | POA: Insufficient documentation

## 2023-12-18 DIAGNOSIS — I340 Nonrheumatic mitral (valve) insufficiency: Secondary | ICD-10-CM | POA: Diagnosis not present

## 2023-12-18 DIAGNOSIS — I700 Atherosclerosis of aorta: Secondary | ICD-10-CM | POA: Insufficient documentation

## 2023-12-18 DIAGNOSIS — D649 Anemia, unspecified: Secondary | ICD-10-CM | POA: Diagnosis not present

## 2023-12-18 DIAGNOSIS — I471 Supraventricular tachycardia, unspecified: Secondary | ICD-10-CM | POA: Diagnosis not present

## 2023-12-18 DIAGNOSIS — Z79899 Other long term (current) drug therapy: Secondary | ICD-10-CM | POA: Diagnosis not present

## 2023-12-18 DIAGNOSIS — Z6836 Body mass index (BMI) 36.0-36.9, adult: Secondary | ICD-10-CM | POA: Diagnosis not present

## 2023-12-18 DIAGNOSIS — M069 Rheumatoid arthritis, unspecified: Secondary | ICD-10-CM | POA: Insufficient documentation

## 2023-12-18 DIAGNOSIS — E119 Type 2 diabetes mellitus without complications: Secondary | ICD-10-CM

## 2023-12-18 HISTORY — PX: TRANSESOPHAGEAL ECHOCARDIOGRAM (CATH LAB): EP1270

## 2023-12-18 LAB — ECHO TEE
AR max vel: 2.56 cm2
AV Area VTI: 2.37 cm2
AV Area mean vel: 2.52 cm2
AV Mean grad: 5 mm[Hg]
AV Peak grad: 7.6 mm[Hg]
Ao pk vel: 1.38 m/s
Area-P 1/2: 5.09 cm2

## 2023-12-18 SURGERY — TRANSESOPHAGEAL ECHOCARDIOGRAM (TEE) (CATHLAB)
Anesthesia: Monitor Anesthesia Care

## 2023-12-18 MED ORDER — LIDOCAINE 2% (20 MG/ML) 5 ML SYRINGE
INTRAMUSCULAR | Status: DC | PRN
  Administered 2023-12-18: 100 mg via INTRAVENOUS

## 2023-12-18 MED ORDER — PROPOFOL 10 MG/ML IV BOLUS
INTRAVENOUS | Status: DC | PRN
  Administered 2023-12-18: 50 mg via INTRAVENOUS

## 2023-12-18 MED ORDER — SODIUM CHLORIDE 0.9% FLUSH: 3.0000 mL | INTRAVENOUS | Status: DC | PRN

## 2023-12-18 MED ORDER — SODIUM CHLORIDE 0.9% FLUSH: 3.0000 mL | Freq: Two times a day (BID) | INTRAVENOUS | Status: DC

## 2023-12-18 MED ORDER — PROPOFOL 500 MG/50ML IV EMUL
INTRAVENOUS | Status: DC | PRN
  Administered 2023-12-18: 100 ug/kg/min via INTRAVENOUS

## 2023-12-18 MED ORDER — SODIUM CHLORIDE 0.9 % IV SOLN: INTRAVENOUS | Status: DC | PRN

## 2023-12-18 NOTE — Anesthesia Procedure Notes (Signed)
Procedure Name: MAC Date/Time: 12/18/2023 8:23 AM  Performed by: Bartholomew Crews, CRNAPre-anesthesia Checklist: Patient identified, Emergency Drugs available, Suction available, Patient being monitored and Timeout performed Patient Re-evaluated:Patient Re-evaluated prior to induction Preoxygenation: Pre-oxygenation with 100% oxygen Induction Type: IV induction Placement Confirmation: positive ETCO2 Dental Injury: Teeth and Oropharynx as per pre-operative assessment

## 2023-12-18 NOTE — CV Procedure (Signed)
    TRANSESOPHAGEAL ECHOCARDIOGRAM   NAME:  Joanna Lee    MRN: [id number] DOB:  04/09/1951    ADMIT DATE: 12/18/2023  INDICATIONS: PFO/ASD evaluation.   PROCEDURE:   Informed consent was obtained prior to the procedure. The risks, benefits and alternatives for the procedure were discussed and the patient comprehended these risks.  Risks include, but are not limited to, cough, sore throat, vomiting, nausea, somnolence, esophageal and stomach trauma or perforation, bleeding, low blood pressure, aspiration, pneumonia, infection, trauma to the teeth and death.    Procedural time out performed.   Anesthesia was administered by Dr. Hyacinth Meeker and his anesthesia team (see their records).  The transesophageal probe was inserted in the esophagus and stomach without difficulty and multiple views were obtained.   COMPLICATIONS:    There were no immediate complications.  KEY FINDINGS:  Preserved LVEF. Patent Foramen Ovale by color doppler and agitated saline.   Full report to follow.  Tessa Lerner, DO, [facility]  [address] [place], [place] [address] Pager: [phone] Office: [phone] 12/18/23 8:37 AM

## 2023-12-18 NOTE — Anesthesia Postprocedure Evaluation (Signed)
Anesthesia Post Note  Patient: Joanna Lee  Procedure(s) Performed: TRANSESOPHAGEAL ECHOCARDIOGRAM     Patient location during evaluation: PACU Anesthesia Type: MAC Level of consciousness: awake and alert Pain management: pain level controlled Vital Signs Assessment: post-procedure vital signs reviewed and stable Respiratory status: spontaneous breathing, nonlabored ventilation and respiratory function stable Cardiovascular status: blood pressure returned to baseline and stable Postop Assessment: no apparent nausea or vomiting Anesthetic complications: no   No notable events documented.  Last Vitals:  Vitals:   12/18/23 0900 12/18/23 0910  BP: 130/76 131/86  Pulse: 61 61  Resp: 17 19  Temp:    SpO2: 99% 99%    Last Pain:  Vitals:   12/18/23 0838  TempSrc:   PainSc: 0-No pain                 Lowella Curb

## 2023-12-18 NOTE — Anesthesia Preprocedure Evaluation (Signed)
Anesthesia Evaluation  Patient identified by MRN, date of birth, ID band Patient awake    Reviewed: Allergy & Precautions, NPO status , Patient's Chart, lab work & pertinent test results  History of Anesthesia Complications (+) PONV, PROLONGED EMERGENCE and history of anesthetic complications  Airway Mallampati: III  TM Distance: >3 FB Neck ROM: Full    Dental  (+) Teeth Intact, Dental Advisory Given   Pulmonary shortness of breath and with exertion, sleep apnea (non-compliant w/ CPAP)    Pulmonary exam normal breath sounds clear to auscultation       Cardiovascular hypertension, Pt. on medications Normal cardiovascular exam Rhythm:Regular Rate:Normal     Neuro/Psych  Headaches Carotid aneurysm 2005 s/p coiling   negative psych ROS   GI/Hepatic negative GI ROS, Neg liver ROS,,,  Endo/Other  diabetes (pre-diabetic)  Obesity BMI 36 a1c 5.9  Renal/GU negative Renal ROS  negative genitourinary   Musculoskeletal  (+) Arthritis , Rheumatoid disorders,    Abdominal  (+) + obese  Peds  Hematology  (+) Blood dyscrasia, anemia hct 41.4, plt 244   Anesthesia Other Findings   Reproductive/Obstetrics negative OB ROS                             Anesthesia Physical Anesthesia Plan  ASA: 3  Anesthesia Plan: MAC   Post-op Pain Management: Minimal or no pain anticipated   Induction: Intravenous  PONV Risk Score and Plan: 3 and Propofol infusion and Treatment may vary due to age or medical condition  Airway Management Planned: Natural Airway and Nasal Cannula  Additional Equipment: None  Intra-op Plan:   Post-operative Plan:   Informed Consent: I have reviewed the patients History and Physical, chart, labs and discussed the procedure including the risks, benefits and alternatives for the proposed anesthesia with the patient or authorized representative who has indicated his/her understanding  and acceptance.     Dental advisory given  Plan Discussed with: CRNA  Anesthesia Plan Comments:         Anesthesia Quick Evaluation

## 2023-12-18 NOTE — Transfer of Care (Signed)
Immediate Anesthesia Transfer of Care Note  Patient: Joanna Lee  Procedure(s) Performed: TRANSESOPHAGEAL ECHOCARDIOGRAM  Patient Location: PACU  Anesthesia Type:MAC  Level of Consciousness: awake, alert , and oriented  Airway & Oxygen Therapy: Patient Spontanous Breathing  Post-op Assessment: Report given to RN  Post vital signs: Reviewed and stable see chart  Last Vitals:  Vitals Value Taken Time  BP    Temp 36.7 C 12/18/23 0838  Pulse 75 12/18/23 0839  Resp 20 12/18/23 0839  SpO2 98 % 12/18/23 0839  Vitals shown include unfiled device data.  Last Pain:  Vitals:   12/18/23 0838  TempSrc:   PainSc: 0-No pain         Complications: No notable events documented.

## 2023-12-18 NOTE — Discharge Instructions (Signed)
TEE   YOU HAD AN CARDIAC PROCEDURE TODAY: Refer to the procedure report and other information in the discharge instructions given to you for any specific questions about what was found during the examination. If this information does not answer your questions, please call Dr. Verl Dicker office at [phone] to clarify.   DIET: Your first meal following the procedure should be a light meal and then it is ok to progress to your normal diet. A half-sandwich or bowl of soup is an example of a good first meal. Heavy or fried foods are harder to digest and may make you feel nauseous or bloated. Drink plenty of fluids but you should avoid alcoholic beverages for 24 hours. If you had a esophageal dilation, please see attached instructions for diet.   ACTIVITY: Your care partner should take you home directly after the procedure. You should plan to take it easy, moving slowly for the rest of the day. You can resume normal activity the day after the procedure however YOU SHOULD NOT DRIVE, use power tools, machinery or perform tasks that involve climbing or major physical exertion for 24 hours (because of the sedation medicines used during the test).   SYMPTOMS TO REPORT IMMEDIATELY: A cardiologist can be reached at any hour. Please call [phone] for any of the following symptoms:  Vomiting of blood or coffee ground material  New, significant abdominal pain  New, significant chest pain or pain under the shoulder blades  Painful or persistently difficult swallowing  New shortness of breath  Black, tarry-looking or red, bloody stools  FOLLOW UP:  Please also call with any specific questions about appointments or follow up tests.

## 2023-12-18 NOTE — Interval H&P Note (Signed)
History and Physical Interval Note:  12/18/2023 7:42 AM  Joanna Lee  has presented today for surgery, with the diagnosis of ASD.  The various methods of treatment have been discussed with the patient and family. After consideration of risks, benefits and other options for treatment, the patient has consented to  Procedure(s): TRANSESOPHAGEAL ECHOCARDIOGRAM (N/A) as a surgical intervention.  The patient's history has been reviewed, patient examined, no change in status, stable for surgery.  I have reviewed the patient's chart and labs.  Questions were answered to the patient's satisfaction.     After careful review of history and examination, the risks, benefits of transesophageal echocardiogram, and alternatives have been explained to the patient. Complications include but not limited to esophageal perforation (rare), gastrointestinal bleeding (rare), cardiac arrhythmia which can include cardiac arrest and death (rare), pharyngeal irritation / discomfort with swallowing / hematoma, methemoglobinemia, bronchospasm, transient hypoxia, nonsustained ventricular tachycardia, transient atrial fibrillation, minimal hemoptysis, vomiting, hypotension, respiratory compromise, reaction to medications, unavoidable damage to teeth and gums, aspiration pneumonia  were reviewed with the patient.  Patient voices understands, provides verbal feedback, questions answered, and patient wishes to proceed with the procedure.   Tessa Lerner, DO, [facility]  [facility]  [address] [place], [place] [address]

## 2024-01-08 DIAGNOSIS — M069 Rheumatoid arthritis, unspecified: Secondary | ICD-10-CM | POA: Diagnosis not present

## 2024-01-08 DIAGNOSIS — E669 Obesity, unspecified: Secondary | ICD-10-CM | POA: Diagnosis not present

## 2024-01-08 DIAGNOSIS — T8149XA Infection following a procedure, other surgical site, initial encounter: Secondary | ICD-10-CM | POA: Diagnosis not present

## 2024-01-08 DIAGNOSIS — E042 Nontoxic multinodular goiter: Secondary | ICD-10-CM | POA: Diagnosis not present

## 2024-01-08 DIAGNOSIS — I10 Essential (primary) hypertension: Secondary | ICD-10-CM | POA: Diagnosis not present

## 2024-01-08 DIAGNOSIS — E785 Hyperlipidemia, unspecified: Secondary | ICD-10-CM | POA: Diagnosis not present

## 2024-01-16 ENCOUNTER — Ambulatory Visit: Payer: Medicare Other | Admitting: Cardiology

## 2024-01-29 ENCOUNTER — Ambulatory Visit: Payer: Medicare Other | Attending: Cardiology | Admitting: Cardiology

## 2024-01-29 ENCOUNTER — Encounter: Payer: Self-pay | Admitting: Cardiology

## 2024-01-29 VITALS — BP 158/80 | HR 89 | Ht 63.0 in | Wt 219.0 lb

## 2024-01-29 DIAGNOSIS — I10 Essential (primary) hypertension: Secondary | ICD-10-CM | POA: Diagnosis not present

## 2024-01-29 DIAGNOSIS — I824Y9 Acute embolism and thrombosis of unspecified deep veins of unspecified proximal lower extremity: Secondary | ICD-10-CM | POA: Diagnosis present

## 2024-01-29 DIAGNOSIS — I471 Supraventricular tachycardia, unspecified: Secondary | ICD-10-CM

## 2024-01-29 DIAGNOSIS — I2692 Saddle embolus of pulmonary artery without acute cor pulmonale: Secondary | ICD-10-CM | POA: Diagnosis not present

## 2024-01-29 DIAGNOSIS — Q2112 Patent foramen ovale: Secondary | ICD-10-CM | POA: Diagnosis not present

## 2024-01-29 DIAGNOSIS — R0609 Other forms of dyspnea: Secondary | ICD-10-CM | POA: Diagnosis not present

## 2024-01-29 MED ORDER — DILTIAZEM HCL ER COATED BEADS 120 MG PO CP24: 120.0000 mg | ORAL_CAPSULE | Freq: Every morning | ORAL | 3 refills | Status: AC

## 2024-01-29 NOTE — Progress Notes (Signed)
 Cardiology Office Note:  .   Date:  01/29/2024  ID:  Milbert Coulter, DOB Dec 30, 1950, MRN [id number] PCP:  Geoffry Paradise, MD  Former Cardiology Providers: None [facility] Providers Cardiologist:  Tessa Lerner, DO , [facility] (established care 07/15/2023) Electrophysiologist:  None  Click to update primary MD,subspecialty MD or APP then REFRESH:1}    Chief Complaint  Patient presents with   Follow-up    S/pTEE, CAC, PSVT     History of Present Illness: .   Joanna Lee is a 73 y.o. African-American female whose past medical history and cardiovascular risk factors includes:  Non-obstructive CAD,  mild CAC, Hypertension, RA, left ICA aneurysm s/p coiling, nontoxic multinodular goiter, BPV.   Referred to the practice back in August 2024 for evaluation of irregular heart rate and lower extremity swelling.  Patient underwent cardiac monitor which noted an average heart rate of 68 bpm, underlying rhythm to be sinus, no detected atrial fibrillation during the monitoring period, and PSVT burden of approximately 2.2%.  She also endorses shortness of breath predominantly with effort related activities.  Patient stated that she had had a stress test back in the [place] which was inconclusive and nondiagnostic according to her.  Due to concerns for possible CAD given her risk factors patient underwent a coronary CTA in October 2024 which noted mild nonobstructive disease predominantly in the LAD.  She had mild coronary artery calcification and moderate total plaque volume.  Interestingly, she was noted to have mild dilatation of the pulmonary artery, visually dilated right atrium and right ventricle, and a small ASD was reported with left-to-right shunting.  Clinically was hard to ascertain if the small ASD was causing the right-sided chamber dilatation and resulting in patient's dyspnea.  She was evaluated by structural heart team to see if she is a candidate for PFO closure.  It was recommended she  undergo TEE for further guidance.  In the interim she was diagnosed with an acute pulmonary embolism and acute DVT and the procedure was postponed.  She finally did undergo TEE which noted a small PFO.  Spoke to Dr. Excell Seltzer in the interim and given her clinical trajectory it is felt the majority of her dyspnea was likely secondary to her underlying acute pulmonary embolism and not the PFO.  Medical management is recommended and patient presents today for follow-up.  Clinically she denies anginal chest pain or heart failure symptoms.  Shortness of breath is significantly improved.  She has not been experiencing irregular heartbeats and currently not on AV nodal blocking agents.   Review of Systems: .   Review of Systems  Cardiovascular:  Negative for chest pain, claudication, irregular heartbeat, leg swelling, near-syncope, orthopnea, palpitations, paroxysmal nocturnal dyspnea and syncope.  Respiratory:  Positive for shortness of breath (much improved).   Hematologic/Lymphatic: Negative for bleeding problem.  Musculoskeletal:  Negative for muscle cramps and myalgias.  Neurological:  Negative for dizziness and light-headedness.    Studies Reviewed:   EKG: EKG Interpretation Date/Time:  Wednesday January 29 2024 11:29:14 EDT Ventricular Rate:  89 PR Interval:  156 QRS Duration:  88 QT Interval:  354 QTC Calculation: 430 R Axis:   59  Text Interpretation: Sinus rhythm with Premature atrial complexes with Abberant conduction Low voltage QRS Cannot rule out Anterior infarct (cited on or before 29-Jan-2024) When compared with ECG of 05-Dec-2023 13:58, Premature ventricular complexes are no longer Present Abberant conduction is now Present Confirmed by Tessa Lerner [phone]) on 01/29/2024 11:45:04 AM  Echocardiogram:  August 23, 2023: LVEF 55 to 60%, grade 1 diastolic dysfunction, elevated left atrial pressures, LAE mildly dilated estimated RAP 3 mmHg, see report for additional details  TEE  12/18/2023  1. Left ventricular ejection fraction, by estimation, is 60 to 65%. The  left ventricle has normal function. The left ventricle has no regional  wall motion abnormalities.   2. Right ventricular systolic function is normal. The right ventricular  size is normal.   3. No left atrial/left atrial appendage thrombus was detected. The LAA  emptying velocity was 44 cm/s.   4. The mitral valve is normal in structure. Mild mitral valve  regurgitation. No evidence of mitral stenosis.   5. The aortic valve is tricuspid. Aortic valve regurgitation is not  visualized. No aortic stenosis is present.   6. There is mild (Grade II) layered plaque involving the descending  aorta.   7. Evidence of atrial level shunting detected by color flow Doppler.  Agitated saline contrast bubble study was positive with shunting observed  within 3-6 cardiac cycles suggestive of interatrial shunt. There is a at  least moderate size patent foramen  ovale with predominantly right to left shunting across the atrial septum.    Cardiac monitor: Cardiac monitor [facility]): July 15, 2023 -July 22, 2023 Dominant rhythm sinus. Heart rate 45-179 bpm. Avg HR 68 bpm. No atrial fibrillation detected during the monitoring period. No ventricular tachycardia, high grade AV block, pauses (3 seconds or longer). Paroxysmal episodes of supraventricular tachycardia, asymptomatic, total supraventricular ectopic burden 2.2% (predominantly isolated beats). Total ventricular ectopic burden <1%. Patient triggered events: 0.   CCTA 09/18/2023 1. Mild CAD in mid LAD, 25-49% stenosis, CADRADS 2.  2. Total plaque volume 114 mm3 which is 31st percentile for age- and sex-matched controls (calcified plaque 8 mm3; non-calcified plaque 106 mm3). TPV is moderate.  3. Coronary calcium score is 28, which places the patient in the 58th percentile for age and sex matched control.  4. Normal coronary origins with right  dominance.  5. Small atrial septal defect with left to right shunt.  6. Mild dilation of main pulmonary artery, 30 mm, may indicate increased pulmonary pressures  7. Radiology Overread : No significant extracardiac findings within the visualized chest.  RECOMMENDATIONS: CAD-RADS 2. Mild non-obstructive CAD (25-49%). Consider non-atherosclerotic causes of chest pain. Consider preventive therapy and risk factor modification.   Risk Assessment/Calculations:   NA   Labs:       Latest Ref Rng & Units 12/05/2023    3:37 PM 10/04/2023   12:30 PM 08/09/2021    3:20 AM  CBC  WBC 3.4 - 10.8 x10E3/uL 8.1  8.1  14.3   Hemoglobin 11.1 - 15.9 g/dL 16.1  09.6  04.5   Hematocrit 34.0 - 46.6 % 40.4  40.6  31.7   Platelets 150 - 450 x10E3/uL 265  285  206        Latest Ref Rng & Units 12/05/2023    3:37 PM 10/04/2023   12:30 PM 08/27/2023    9:48 AM  BMP  Glucose 70 - 99 mg/dL 409  811  914   BUN 8 - 27 mg/dL 10  9  15    Creatinine 0.57 - 1.00 mg/dL 7.82  9.56  2.13   BUN/Creat Ratio 12 - 28 14  11  19    Sodium 134 - 144 mmol/L 140  139  142   Potassium 3.5 - 5.2 mmol/L 3.4  4.1  3.5   Chloride 96 - 106  mmol/L 99  100  105   CO2 20 - 29 mmol/L 25  28  25    Calcium 8.7 - 10.3 mg/dL 91.4  78.2  95.6       Latest Ref Rng & Units 12/05/2023    3:37 PM 10/04/2023   12:30 PM 08/27/2023    9:48 AM  CMP  Glucose 70 - 99 mg/dL 213  086  578   BUN 8 - 27 mg/dL 10  9  15    Creatinine 0.57 - 1.00 mg/dL 4.69  6.29  5.28   Sodium 134 - 144 mmol/L 140  139  142   Potassium 3.5 - 5.2 mmol/L 3.4  4.1  3.5   Chloride 96 - 106 mmol/L 99  100  105   CO2 20 - 29 mmol/L 25  28  25    Calcium 8.7 - 10.3 mg/dL 41.3  24.4  01.0    No results for input(s): "LIPOA" in the last 8760 hours. No components found for: "NTPROBNP" No results for input(s): "PROBNP" in the last 8760 hours. No results for input(s): "TSH" in the last 8760 hours.  External Labs: Collected: 03/25/2023 [facility] database. Total  cholesterol 200, triglycerides 74, HDL 57, LDL 128  Physical Exam:    Today's Vitals   01/29/24 1122  BP: (!) 158/80  Pulse: 89  SpO2: 95%  Weight: 219 lb (99.3 kg)  Height: 5\' 3"  (1.6 m)   Body mass index is 38.79 kg/m. Wt Readings from Last 3 Encounters:  01/29/24 219 lb (99.3 kg)  12/18/23 219 lb (99.3 kg)  12/05/23 217 lb (98.4 kg)    Physical Exam  Constitutional: No distress.  Age appropriate, hemodynamically stable.   Neck: No JVD present.  Cardiovascular: Normal rate, regular rhythm, S1 normal, S2 normal, intact distal pulses and normal pulses. Exam reveals no gallop, no S3 and no S4.  No murmur heard. Pulmonary/Chest: Effort normal and breath sounds normal. No stridor. She has no wheezes. She has no rales.  Abdominal: Soft. Bowel sounds are normal. She exhibits no distension. There is no abdominal tenderness.  Musculoskeletal:        General: No edema.     Cervical back: Neck supple.  Neurological: She is alert and oriented to person, place, and time. She has intact cranial nerves (2-12).  Skin: Skin is warm and moist.     Impression & Recommendation(s):  Impression:   ICD-10-CM   1. PSVT (paroxysmal supraventricular tachycardia) (HCC)  I47.10 EKG 12-Lead    2. Dyspnea on exertion  R06.09     3. Acute deep vein thrombosis (DVT) of proximal vein of lower extremity, unspecified laterality (HCC)  I82.4Y9     4. Acute saddle pulmonary embolism, unspecified whether acute cor pulmonale present (HCC)  I26.92     5. PFO (patent foramen ovale)  Q21.12     6. Benign hypertension  I10        Recommendation(s):  PSVT (paroxysmal supraventricular tachycardia) (HCC) Recent cardiac monitor noted PSVT burden of approximately 2.2%. Clinically she feels better. EKG today illustrates sinus rhythm with frequent ectopy. Shared decision was to start diltiazem 120 mg p.o. every morning Monitor for now  Dyspnea on exertion Chronic. Significantly improved. Likely  precipitated by acute pulmonary embolism Has undergone ischemic workup including a coronary CTA which notes mild nonobstructive disease as mentioned above Continue to improve modifiable cardiovascular risk factors. Monitor for now  Acute deep vein thrombosis (DVT) of proximal vein of lower extremity, unspecified laterality (HCC) Acute  saddle pulmonary embolism, unspecified whether acute cor pulmonale present [facility]) Diagnosed back in December 2024 at [facility] Currently on Eliquis for duration of at least 6 months. She will follow-up with PCP for further guidance thereafter.  PFO (patent foramen ovale) Likely an incidental finding initially noted on a coronary CTA and then verified on the transesophageal echocardiogram. It is felt given her clinical trajectory that the right-sided chamber dilatation and dilated pulmonary artery are likely secondary to her acute PE then left-to-right shunting via the PFO.  Benign hypertension Office blood pressures are not at goal. Patient states that she has been taking her blood pressures at home and they are well-controlled. Continue hydrochlorothiazide 12.5 mg p.o. every morning. Continue losartan 50 mg p.o. every afternoon. Reemphasized importance of low-salt diet.  Currently on aspirin 81 mg p.o. daily along with Eliquis.  Recommended holding off aspirin for now and may restart if Eliquis is discontinued after a total of 6 months of therapy.  Orders Placed:  Orders Placed This Encounter  Procedures   EKG 12-Lead   Final Medication List:    Meds ordered this encounter  Medications   diltiazem (CARDIZEM CD) 120 MG 24 hr capsule    Sig: Take 1 capsule (120 mg total) by mouth in the morning.    Dispense:  30 capsule    Refill:  3    Medications Discontinued During This Encounter  Medication Reason   methotrexate (RHEUMATREX) 2.5 MG tablet Duplicate   aspirin EC 81 MG tablet Discontinued by provider     Current Outpatient Medications:     ASTRAGALUS PO, Take 1 capsule by mouth daily as needed (winter cold prevention)., Disp: , Rfl:    Cholecalciferol (VITAMIN D) 50 MCG (2000 UT) tablet, Take 2,000 Units by mouth daily., Disp: , Rfl:    diltiazem (CARDIZEM CD) 120 MG 24 hr capsule, Take 1 capsule (120 mg total) by mouth in the morning., Disp: 30 capsule, Rfl: 3   ECHINACEA PO, Take 1 capsule by mouth daily as needed (winter cold prevention)., Disp: , Rfl:    ELIQUIS 5 MG TABS tablet, 1 tablet Orally twice a day for 30 days, Disp: , Rfl:    folic acid (FOLVITE) 1 MG tablet, Take 1 mg by mouth daily., Disp: , Rfl:    hydrochlorothiazide (HYDRODIURIL) 12.5 MG tablet, 1 tablet in the morning Oral Once a day for 90 days, Disp: , Rfl:    losartan (COZAAR) 50 MG tablet, Take 50 mg by mouth in the morning., Disp: , Rfl:    montelukast (SINGULAIR) 10 MG tablet, Take 1 tablet (10 mg total) by mouth at bedtime., Disp: 30 tablet, Rfl: 0   polyvinyl alcohol (LIQUIFILM TEARS) 1.4 % ophthalmic solution, Place 1 drop into both eyes as needed for dry eyes., Disp: , Rfl:    potassium chloride SA (KLOR-CON) 20 MEQ tablet, Take 20 mEq by mouth daily., Disp: , Rfl:    rosuvastatin (CRESTOR) 20 MG tablet, Take 1 tablet (20 mg total) by mouth daily., Disp: 90 tablet, Rfl: 3   TURMERIC PO, Take 1,000 mg by mouth daily., Disp: , Rfl:    methotrexate (RHEUMATREX) 2.5 MG tablet, Take 10 mg by mouth every Sunday. (Patient not taking: Reported on 01/29/2024), Disp: , Rfl:    OVER THE COUNTER MEDICATION, Take 1,000 mg by mouth 2 (two) times a week. Burdock Root (Patient not taking: Reported on 01/29/2024), Disp: , Rfl:   Consent:      NA  Disposition:   Return  in about 1 year (around 01/28/2025), or Mild nonobstructive CAD, PSVT. or sooner if needed.  Her questions and concerns were addressed to her satisfaction. She voices understanding of the recommendations provided during this encounter.    Signed, Tessa Lerner, DO, [facility] [place]  [facility]   [address] [place], [place] [address] [phone] 01/29/2024 1:06 PM

## 2024-01-29 NOTE — Patient Instructions (Signed)
 Medication Instructions:  Your physician has recommended you make the following change in your medication:   START Diltiazem (Cardizem) 120 mg once in the morning  **Take LOSARTAN in the MORNINGS**   *If you need a refill on your cardiac medications before your next appointment, please call your pharmacy*  Lab Work: None ordered today. If you have labs (blood work) drawn today and your tests are completely normal, you will receive your results only by: MyChart Message (if you have MyChart) OR A paper copy in the mail If you have any lab test that is abnormal or we need to change your treatment, we will call you to review the results.  Testing/Procedures: None ordered today.  Follow-Up: At [facility], you and your health needs are our priority.  As part of our continuing mission to provide you with exceptional heart care, we have created designated Provider Care Teams.  These Care Teams include your primary Cardiologist (physician) and Advanced Practice Providers (APPs -  Physician Assistants and Nurse Practitioners) who all work together to provide you with the care you need, when you need it.  We recommend signing up for the patient portal called "MyChart".  Sign up information is provided on this After Visit Summary.  MyChart is used to connect with patients for Virtual Visits (Telemedicine).  Patients are able to view lab/test results, encounter notes, upcoming appointments, etc.  Non-urgent messages can be sent to your provider as well.   To learn more about what you can do with MyChart, go to [email or web address].    Your next appointment:   1 year(s)  The format for your next appointment:   In Person  Provider:   Tessa Lerner, DO {

## 2024-02-04 ENCOUNTER — Other Ambulatory Visit (HOSPITAL_COMMUNITY): Payer: Self-pay | Admitting: Interventional Radiology

## 2024-02-04 DIAGNOSIS — I671 Cerebral aneurysm, nonruptured: Secondary | ICD-10-CM

## 2024-02-13 ENCOUNTER — Ambulatory Visit (HOSPITAL_COMMUNITY)
Admission: RE | Admit: 2024-02-13 | Discharge: 2024-02-13 | Disposition: A | Discharge status: Home / Self Care | Source: Ambulatory Visit | Attending: Interventional Radiology | Admitting: Interventional Radiology

## 2024-02-13 DIAGNOSIS — I671 Cerebral aneurysm, nonruptured: Secondary | ICD-10-CM | POA: Diagnosis not present

## 2024-02-13 DIAGNOSIS — I6529 Occlusion and stenosis of unspecified carotid artery: Secondary | ICD-10-CM | POA: Diagnosis not present

## 2024-02-13 DIAGNOSIS — I771 Stricture of artery: Secondary | ICD-10-CM | POA: Diagnosis not present

## 2024-02-13 DIAGNOSIS — E042 Nontoxic multinodular goiter: Secondary | ICD-10-CM | POA: Diagnosis not present

## 2024-02-13 MED ORDER — IOHEXOL 350 MG/ML SOLN
80.0000 mL | Freq: Once | INTRAVENOUS | Status: AC | PRN
  Administered 2024-02-13: 80 mL via INTRAVENOUS

## 2024-03-03 ENCOUNTER — Telehealth (HOSPITAL_COMMUNITY): Payer: Self-pay

## 2024-03-03 NOTE — Telephone Encounter (Signed)
 Pt agreed to f/u in 3 years with a cta head/neck. AB

## 2024-05-11 DIAGNOSIS — I10 Essential (primary) hypertension: Secondary | ICD-10-CM | POA: Diagnosis not present

## 2024-05-11 DIAGNOSIS — E785 Hyperlipidemia, unspecified: Secondary | ICD-10-CM | POA: Diagnosis not present

## 2024-05-11 DIAGNOSIS — E042 Nontoxic multinodular goiter: Secondary | ICD-10-CM | POA: Diagnosis not present

## 2024-05-11 DIAGNOSIS — Z1212 Encounter for screening for malignant neoplasm of rectum: Secondary | ICD-10-CM | POA: Diagnosis not present

## 2024-05-16 ENCOUNTER — Encounter (HOSPITAL_COMMUNITY): Payer: Self-pay | Admitting: Interventional Radiology

## 2024-05-18 DIAGNOSIS — Z1331 Encounter for screening for depression: Secondary | ICD-10-CM | POA: Diagnosis not present

## 2024-05-18 DIAGNOSIS — M069 Rheumatoid arthritis, unspecified: Secondary | ICD-10-CM | POA: Diagnosis not present

## 2024-05-18 DIAGNOSIS — I10 Essential (primary) hypertension: Secondary | ICD-10-CM | POA: Diagnosis not present

## 2024-05-18 DIAGNOSIS — R600 Localized edema: Secondary | ICD-10-CM | POA: Diagnosis not present

## 2024-05-18 DIAGNOSIS — I872 Venous insufficiency (chronic) (peripheral): Secondary | ICD-10-CM | POA: Diagnosis not present

## 2024-05-18 DIAGNOSIS — M1990 Unspecified osteoarthritis, unspecified site: Secondary | ICD-10-CM | POA: Diagnosis not present

## 2024-05-18 DIAGNOSIS — E669 Obesity, unspecified: Secondary | ICD-10-CM | POA: Diagnosis not present

## 2024-05-18 DIAGNOSIS — Z86711 Personal history of pulmonary embolism: Secondary | ICD-10-CM | POA: Diagnosis not present

## 2024-05-18 DIAGNOSIS — Z0000 Encounter for general adult medical examination without abnormal findings: Secondary | ICD-10-CM | POA: Diagnosis not present

## 2024-05-18 DIAGNOSIS — Z1339 Encounter for screening examination for other mental health and behavioral disorders: Secondary | ICD-10-CM | POA: Diagnosis not present

## 2024-05-18 DIAGNOSIS — I749 Embolism and thrombosis of unspecified artery: Secondary | ICD-10-CM | POA: Diagnosis not present
